# Patient Record
Sex: Female | Born: 1960 | Race: White | Hispanic: No | Smoking: Never smoker
Health system: Southern US, Community
[De-identification: ages and names within clinical notes are randomized; demographics above are authoritative.]

## PROBLEM LIST (undated history)

## (undated) DIAGNOSIS — R011 Cardiac murmur, unspecified: Secondary | ICD-10-CM

## (undated) DIAGNOSIS — E785 Hyperlipidemia, unspecified: Secondary | ICD-10-CM

## (undated) DIAGNOSIS — K754 Autoimmune hepatitis: Secondary | ICD-10-CM

## (undated) DIAGNOSIS — K7460 Unspecified cirrhosis of liver: Secondary | ICD-10-CM

## (undated) DIAGNOSIS — I7300 Raynaud's syndrome without gangrene: Secondary | ICD-10-CM

## (undated) DIAGNOSIS — Z8489 Family history of other specified conditions: Secondary | ICD-10-CM

## (undated) DIAGNOSIS — I8500 Esophageal varices without bleeding: Secondary | ICD-10-CM

## (undated) HISTORY — DX: Hyperlipidemia, unspecified: E78.5

## (undated) HISTORY — PX: ABDOMINAL HYSTERECTOMY: SHX81

## (undated) HISTORY — PX: WISDOM TOOTH EXTRACTION: SHX21

## (undated) HISTORY — DX: Autoimmune hepatitis: K75.4

## (undated) HISTORY — DX: Esophageal varices without bleeding: I85.00

## (undated) HISTORY — DX: Unspecified cirrhosis of liver: K74.60

---

## 1999-10-17 ENCOUNTER — Other Ambulatory Visit: Admission: RE | Admit: 1999-10-17 | Discharge: 1999-10-17 | Payer: Self-pay | Admitting: *Deleted

## 1999-10-17 ENCOUNTER — Encounter (INDEPENDENT_AMBULATORY_CARE_PROVIDER_SITE_OTHER): Payer: Self-pay | Admitting: Specialist

## 1999-12-08 ENCOUNTER — Other Ambulatory Visit: Admission: RE | Admit: 1999-12-08 | Discharge: 1999-12-08 | Payer: Self-pay | Admitting: *Deleted

## 2000-01-29 ENCOUNTER — Emergency Department (HOSPITAL_COMMUNITY): Admission: EM | Admit: 2000-01-29 | Discharge: 2000-01-29 | Payer: Self-pay | Admitting: *Deleted

## 2001-01-17 ENCOUNTER — Other Ambulatory Visit: Admission: RE | Admit: 2001-01-17 | Discharge: 2001-01-17 | Payer: Self-pay | Admitting: *Deleted

## 2002-01-30 ENCOUNTER — Other Ambulatory Visit: Admission: RE | Admit: 2002-01-30 | Discharge: 2002-01-30 | Payer: Self-pay | Admitting: *Deleted

## 2003-02-26 ENCOUNTER — Other Ambulatory Visit: Admission: RE | Admit: 2003-02-26 | Discharge: 2003-02-26 | Payer: Self-pay | Admitting: *Deleted

## 2004-03-20 ENCOUNTER — Other Ambulatory Visit: Admission: RE | Admit: 2004-03-20 | Discharge: 2004-03-20 | Payer: Self-pay | Admitting: *Deleted

## 2005-04-13 ENCOUNTER — Other Ambulatory Visit: Admission: RE | Admit: 2005-04-13 | Discharge: 2005-04-13 | Payer: Self-pay | Admitting: *Deleted

## 2006-05-17 ENCOUNTER — Other Ambulatory Visit: Admission: RE | Admit: 2006-05-17 | Discharge: 2006-05-17 | Payer: Self-pay | Admitting: *Deleted

## 2007-06-14 ENCOUNTER — Other Ambulatory Visit: Admission: RE | Admit: 2007-06-14 | Discharge: 2007-06-14 | Payer: Self-pay | Admitting: *Deleted

## 2007-09-06 ENCOUNTER — Other Ambulatory Visit: Admission: RE | Admit: 2007-09-06 | Discharge: 2007-09-06 | Payer: Self-pay | Admitting: Gynecology

## 2007-11-11 ENCOUNTER — Ambulatory Visit: Admission: RE | Admit: 2007-11-11 | Discharge: 2007-11-11 | Payer: Self-pay | Admitting: Gynecology

## 2008-09-14 ENCOUNTER — Encounter: Admission: RE | Admit: 2008-09-14 | Discharge: 2008-09-14 | Payer: Self-pay | Admitting: Gynecology

## 2010-04-07 ENCOUNTER — Encounter: Payer: Self-pay | Admitting: Gynecology

## 2010-07-29 NOTE — Consult Note (Signed)
NAME:  Michelle Schneider, Michelle Schneider             ACCOUNT NO.:  [id number]   MEDICAL RECORD NO.:  [id number]          PATIENT TYPE:  OUT   LOCATION:  GYN                          FACILITY:  [facility]   PHYSICIAN:  De Blanch, M.D.DATE OF BIRTH:  Apr 06, 1960   DATE OF CONSULTATION:  11/11/2007  DATE OF DISCHARGE:                                 CONSULTATION   CHIEF COMPLAINT:  Dysplasia of the cervix.   Patient is seen in consultation at the request of Dr. Teodora Medici  regarding management of Pap smear showing low grade squamous  intraepithelial lesion.  The patient's history dates back to undergoing  a supracervical hysterectomy for uterine fibroids.  Prior to that time,  she has never had an abnormal Pap smear.  Subsequently, in March, 2009,  she had a Pap smear showing LGSIL.  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  Subsequently, the patient had a repeat Pap smear on September 06, 2007, which revealed ASCUS.  Colposcopy and biopsies were performed  by Dr. Chevis Pretty.  Biopsies were negative.   The patient denies any past history of vaginal or cervical dysplasia or  condylomata.  She has no other significant gynecologic history.   OBSTETRICAL HISTORY:  Gravida 0.   PAST SURGICAL HISTORY:  Supracervical hysterectomy for uterine fibroids.   CURRENT MEDICATIONS:  1. Primrose.  2. DHEAS.  3. Magnesium.  4. Folic acid.  5. Vitamin D.   FAMILY HISTORY:  Negative for gynecologic, breast, or colon cancer.   DRUG ALLERGIES:  None.   REVIEW OF SYSTEMS:  A 10-point comprehensive review of systems is  negative, except as noted above.   SOCIAL HISTORY:  Patient is married and does not smoke.   PHYSICAL EXAMINATION:  Height 5 foot 2.  Weight 97 pounds.  Blood  pressure 128/82, pulse 72.  GENERAL:  Patient is a healthy, slender white female in no acute  distress.  HEENT:  Negative.  NECK:  Supple without thyromegaly.  There is no supraclavicular or  inguinal adenopathy.  ABDOMEN:   Soft and nontender.  No mass, organomegaly, ascites, or  hernias are noted.  PELVIC:  EG/BUS, vagina, bladder, and urethra are normal.  There is a  portion of residual cervix noted at the vaginal vault.  Bimanual exam  reveals no other masses or uterine fundus.   PROCEDURE NOTE:  Colposcopic examination is performed of the cervix and  entire vagina.  I am unable to see any lesions, despite the use of a  green filter and acetic acid.  In addition, Lugol's solution is applied,  and no lesions are noted.   IMPRESSION:  ASCUS and LGSIL Pap smears with no obvious lesions on  colposcopy.   I had a lengthy discussion with the patient regarding the natural  history of HPV infections and the fact that if this is occult, we have  no treatment that we should pursue.  I would recommend that she have a  repeat Pap smear in six months.  Unless she has progressive worsening of  her Pap smear, I would simply provide continued surveillance.  De Blanch, M.D.  Electronically Signed     DC/MEDQ  D:  11/11/2007  T:  11/11/2007  Job:  [id number]   cc:   Leatha Gilding. Mezer, M.D.  Fax: [phone]   Telford Nab, R.N.  [address]  [place], [place] [address]

## 2015-05-11 ENCOUNTER — Encounter (HOSPITAL_BASED_OUTPATIENT_CLINIC_OR_DEPARTMENT_OTHER): Payer: Self-pay | Admitting: Emergency Medicine

## 2015-05-11 ENCOUNTER — Emergency Department (HOSPITAL_BASED_OUTPATIENT_CLINIC_OR_DEPARTMENT_OTHER)
Admission: EM | Admit: 2015-05-11 | Discharge: 2015-05-11 | Disposition: A | Discharge status: Home / Self Care | Payer: Self-pay | Attending: Emergency Medicine | Admitting: Emergency Medicine

## 2015-05-11 ENCOUNTER — Emergency Department (HOSPITAL_BASED_OUTPATIENT_CLINIC_OR_DEPARTMENT_OTHER): Payer: Self-pay

## 2015-05-11 DIAGNOSIS — Y998 Other external cause status: Secondary | ICD-10-CM | POA: Insufficient documentation

## 2015-05-11 DIAGNOSIS — S0011XA Contusion of right eyelid and periocular area, initial encounter: Secondary | ICD-10-CM | POA: Insufficient documentation

## 2015-05-11 DIAGNOSIS — Y9389 Activity, other specified: Secondary | ICD-10-CM | POA: Insufficient documentation

## 2015-05-11 DIAGNOSIS — S42201A Unspecified fracture of upper end of right humerus, initial encounter for closed fracture: Secondary | ICD-10-CM | POA: Insufficient documentation

## 2015-05-11 DIAGNOSIS — W010XXA Fall on same level from slipping, tripping and stumbling without subsequent striking against object, initial encounter: Secondary | ICD-10-CM | POA: Insufficient documentation

## 2015-05-11 DIAGNOSIS — Y92002 Bathroom of unspecified non-institutional (private) residence single-family (private) house as the place of occurrence of the external cause: Secondary | ICD-10-CM | POA: Insufficient documentation

## 2015-05-11 MED ORDER — OXYCODONE-ACETAMINOPHEN 5-325 MG PO TABS
1.0000 | ORAL_TABLET | Freq: Once | ORAL | Status: AC
  Administered 2015-05-11: 1 via ORAL
  Filled 2015-05-11: qty 1

## 2015-05-11 MED ORDER — OXYCODONE-ACETAMINOPHEN 5-325 MG PO TABS: 1.0000 | ORAL_TABLET | ORAL | Status: DC | PRN

## 2015-05-11 NOTE — ED Provider Notes (Signed)
CSN: [id number]     Arrival date & time 05/11/15  1138 History   First MD Initiated Contact with Patient 05/11/15 1155     Chief Complaint  Patient presents with  . Shoulder Injury     (Consider location/radiation/quality/duration/timing/severity/associated sxs/prior Treatment) HPI Comments: Patient planes of right shoulder pain. She states that last night about 11:30 she tripped over one of her little dogs in the bathroom and fell over onto the floor. She injured her right shoulder. She also struck the corner of her right eye on the floor she thinks but she denies any facial pain. No vision changes. She denies any headache. No nausea or vomiting. No neck or back pain. She denies any other injuries from the fall. She is not on anticoagulants.  Patient is a 55 y.o. female presenting with shoulder injury.  Shoulder Injury Pertinent negatives include no headaches.    History reviewed. No pertinent past medical history. Past Surgical History  Procedure Laterality Date  . Abdominal hysterectomy     No family history on file. Social History  Substance Use Topics  . Smoking status: Never Smoker   . Smokeless tobacco: None  . Alcohol Use: Yes     Comment: occasional   OB History    No data available     Review of Systems  Constitutional: Negative for fever.  Gastrointestinal: Negative for nausea and vomiting.  Musculoskeletal: Positive for joint swelling and arthralgias. Negative for back pain and neck pain.  Skin: Negative for wound.  Neurological: Negative for weakness, numbness and headaches.      Allergies  Review of patient's allergies indicates no known allergies.  Home Medications   Prior to Admission medications   Medication Sig Start Date End Date Taking? Authorizing Provider  oxyCODONE-acetaminophen (PERCOCET) 5-325 MG tablet Take 1-2 tablets by mouth every 4 (four) hours as needed. 05/11/15   Rolan Bucco, MD   BP 147/85 mmHg  Pulse 82  Temp(Src) 98.4 F  (36.9 C) (Oral)  Resp 16  Ht  (1.575 m)  Wt 96 lb (43.545 kg)  BMI 17.55 kg/m2  SpO2 100% Physical Exam  Constitutional: She is oriented to person, place, and time. She appears well-developed and well-nourished.  HENT:  Head: Normocephalic.  There is a small area of ecchymosis and swelling to the lateral aspect of her right eye. There is no erythema or evident injury to the eye itself. Extraocular eye movements are intact. There is no underlying bony tenderness to the face.  Neck: Normal range of motion. Neck supple.  No pain along the cervical thoracic or lumbosacral spine  Cardiovascular: Normal rate.   Pulmonary/Chest: Effort normal.  Musculoskeletal: She exhibits edema and tenderness.  Positive swelling and tenderness to the right shoulder diffusely. There is no pain to the elbow or wrist. She has normal sensation and motor function distally. Radial pulses are intact. There is no other pain on palpation or range of motion of the extremities  Neurological: She is alert and oriented to person, place, and time.  Skin: Skin is warm and dry.  Psychiatric: She has a normal mood and affect.    ED Course  Procedures (including critical care time) Labs Review Labs Reviewed - No data to display  Imaging Review Dg Shoulder Right  05/11/2015  CLINICAL DATA:  Right shoulder pain and swelling after fall yesterday in bathroom. EXAM: RIGHT SHOULDER - 2+ VIEW COMPARISON:  None. FINDINGS: There is a right humeral neck fracture with mild displacement. No dislocation. No  additional acute bony abnormality. IMPRESSION: Mildly displaced right humeral neck fracture. Electronically Signed   By: Charlett Nose M.D.   On: 05/11/2015 12:31   I have personally reviewed and evaluated these images and lab results as part of my medical decision-making.   EKG Interpretation None      MDM   Final diagnoses:  Proximal humerus fracture, right, closed, initial encounter    Patient with a proximal  right humerus fracture. She's neurovascularly intact. She was placed in a shoulder sling. She was given prescription for Percocet for pain and given a dose here in the ED. There is no other evident injuries. She was advised to make a follow-up appointment with orthopedics.    Rolan Bucco, MD 05/11/15 1242

## 2015-05-11 NOTE — Discharge Instructions (Signed)
Humerus Fracture Treated With Immobilization °The humerus is the large bone in the upper arm. A broken (fractured) humerus is often treated by wearing a cast, splint, or sling (immobilization). This holds the broken pieces in place so they can heal.  °HOME CARE °· Put ice on the injured area. °¨ Put ice in a plastic bag. °¨ Place a towel between your skin and the bag. °¨ Leave the ice on for 15-20 minutes, 03-04 times a day. °· If you are given a cast: °¨ Do not scratch the skin under the cast. °¨ Check the skin around the cast every day. You may put lotion on any red or sore areas. °¨ Keep the cast dry and clean. °· If you are given a splint: °¨ Wear the splint as told. °¨ Keep the splint clean and dry. °¨ Loosen the elastic around the splint if your fingers become numb, cold, tingle, or turn blue. °· If you are given a sling: °¨ Wear the sling as told. °· Do not put pressure on any part of the cast or splint until it is fully hardened. °· The cast or splint must be protected with a plastic bag during bathing. Do not lower the cast or splint into water. °· Only take medicine as told by your doctor. °· Do exercises as told by your doctor. °· Follow up as told by your doctor. °GET HELP RIGHT AWAY IF:  °· Your skin or fingernails turn blue or gray. °· Your arm feels cold or numb. °· You have very bad pain in the injured arm. °· You are having problems with the medicines you were given. °MAKE SURE YOU:  °· Understand these instructions. °· Will watch your condition. °· Will get help right away if you are not doing well or get worse. °  °This information is not intended to replace advice given to you by your health care provider. Make sure you discuss any questions you have with your health care provider. °  °Document Released: 08/19/2007 Document Revised: 03/23/2014 Document Reviewed: 07/25/2014 °Elsevier Interactive Patient Education ©2016 [employer] ° °

## 2015-05-11 NOTE — ED Notes (Signed)
Pt tripped over dog last night, injuring right shoulder.

## 2015-05-22 ENCOUNTER — Encounter (HOSPITAL_COMMUNITY): Payer: Self-pay

## 2015-05-22 ENCOUNTER — Encounter (HOSPITAL_COMMUNITY)
Admission: RE | Admit: 2015-05-22 | Discharge: 2015-05-22 | Disposition: A | Discharge status: Home / Self Care | Payer: Self-pay | Source: Ambulatory Visit | Attending: Orthopedic Surgery | Admitting: Orthopedic Surgery

## 2015-05-22 DIAGNOSIS — W010XXA Fall on same level from slipping, tripping and stumbling without subsequent striking against object, initial encounter: Secondary | ICD-10-CM | POA: Insufficient documentation

## 2015-05-22 DIAGNOSIS — Z01818 Encounter for other preprocedural examination: Secondary | ICD-10-CM | POA: Insufficient documentation

## 2015-05-22 DIAGNOSIS — Z01812 Encounter for preprocedural laboratory examination: Secondary | ICD-10-CM | POA: Insufficient documentation

## 2015-05-22 DIAGNOSIS — Y92002 Bathroom of unspecified non-institutional (private) residence single-family (private) house as the place of occurrence of the external cause: Secondary | ICD-10-CM | POA: Insufficient documentation

## 2015-05-22 DIAGNOSIS — S42201A Unspecified fracture of upper end of right humerus, initial encounter for closed fracture: Secondary | ICD-10-CM | POA: Insufficient documentation

## 2015-05-22 HISTORY — DX: Raynaud's syndrome without gangrene: I73.00

## 2015-05-22 HISTORY — DX: Cardiac murmur, unspecified: R01.1

## 2015-05-22 HISTORY — DX: Family history of other specified conditions: Z84.89

## 2015-05-22 LAB — CBC WITH DIFFERENTIAL/PLATELET
Basophils Absolute: 0.1 10*3/uL (ref 0.0–0.1)
Basophils Relative: 2 %
Eosinophils Absolute: 0.1 10*3/uL (ref 0.0–0.7)
Eosinophils Relative: 3 %
HCT: 34.9 % — ABNORMAL LOW (ref 36.0–46.0)
Hemoglobin: 11.5 g/dL — ABNORMAL LOW (ref 12.0–15.0)
Lymphocytes Relative: 30 %
Lymphs Abs: 1.6 10*3/uL (ref 0.7–4.0)
MCH: 34.1 pg — ABNORMAL HIGH (ref 26.0–34.0)
MCHC: 33 g/dL (ref 30.0–36.0)
MCV: 103.6 fL — ABNORMAL HIGH (ref 78.0–100.0)
Monocytes Absolute: 0.7 10*3/uL (ref 0.1–1.0)
Monocytes Relative: 14 %
Neutro Abs: 2.8 10*3/uL (ref 1.7–7.7)
Neutrophils Relative %: 51 %
Platelets: 156 10*3/uL (ref 150–400)
RBC: 3.37 MIL/uL — ABNORMAL LOW (ref 3.87–5.11)
RDW: 14.5 % (ref 11.5–15.5)
WBC: 5.3 10*3/uL (ref 4.0–10.5)

## 2015-05-22 LAB — COMPREHENSIVE METABOLIC PANEL
ALT: 24 U/L (ref 14–54)
AST: 46 U/L — ABNORMAL HIGH (ref 15–41)
Albumin: 3.1 g/dL — ABNORMAL LOW (ref 3.5–5.0)
Alkaline Phosphatase: 82 U/L (ref 38–126)
Anion gap: 9 (ref 5–15)
BUN: 9 mg/dL (ref 6–20)
CO2: 23 mmol/L (ref 22–32)
Calcium: 9.3 mg/dL (ref 8.9–10.3)
Chloride: 107 mmol/L (ref 101–111)
Creatinine, Ser: 0.59 mg/dL (ref 0.44–1.00)
GFR calc Af Amer: >60 mL/min (ref >60)
GFR calc non Af Amer: >60 mL/min (ref >60)
Glucose, Bld: 101 mg/dL — ABNORMAL HIGH (ref 65–99)
Potassium: 4.1 mmol/L (ref 3.5–5.1)
Sodium: 139 mmol/L (ref 135–145)
Total Bilirubin: 2 mg/dL — ABNORMAL HIGH (ref 0.3–1.2)
Total Protein: 7.7 g/dL (ref 6.5–8.1)

## 2015-05-22 LAB — PROTIME-INR
INR: 1.34 (ref 0.00–1.49)
Prothrombin Time: 16.7 seconds — ABNORMAL HIGH (ref 11.6–15.2)

## 2015-05-22 LAB — APTT: aPTT: 37 seconds (ref 24–37)

## 2015-05-22 MED ORDER — CEFAZOLIN SODIUM-DEXTROSE 2-3 GM-% IV SOLR
2.0000 g | INTRAVENOUS | Status: AC
  Administered 2015-05-23: 2 g via INTRAVENOUS
  Filled 2015-05-22: qty 50

## 2015-05-22 MED ORDER — LACTATED RINGERS IV SOLN
INTRAVENOUS | Status: DC
  Administered 2015-05-23: 20 mL/h via INTRAVENOUS

## 2015-05-22 NOTE — Progress Notes (Signed)
PCP - denies - pt. States that if needed she goes to an urgent care but has not been since 2001 Cardiologist - denies  EKG/CXR - denies Echo/stress test/cardiac cath - denies  Patient denies chest pain and shortness of breath at PAT appointment.

## 2015-05-22 NOTE — Progress Notes (Signed)
Anesthesia Chart Review: Patient is a 55 year old female scheduled for ORIF right proximal humerus fracture on 05/23/15 by Dr. Onnie Graham. On the evening of 05/10/15 she tripped over one of her dogs and fell in the bathroom fracturing her right humerus.  History includes non-smoker, murmur '01 (was not heard in follow-up visits), Raynauds' disease, hysterectomy. Reported a cousin had a difficult time waking up after anesthesia. No PCP--seen in Urgent Care as needed. Denied prior cardiac testing. Denied CP/SOB.   Preoperative labs noted. Glucose 101. Cr 0.59. Total bilirubin elevated at 2.0, but Alk Ph and ALT WNL. AST minimally elevated at 46. H/H 11.5/34.9. PT 16.7, but INR normal at 1.34. PTT 37. No comparison labs noted.   Further evaluation on the day of surgery by her anesthesiologist to determine the definitive anesthesia plan.   [facility]/Anesthesiology Phone [phone] 05/22/2015 1:22 PM

## 2015-05-23 ENCOUNTER — Encounter (HOSPITAL_COMMUNITY)
Admission: RE | Disposition: A | Discharge status: Home / Self Care | Payer: Self-pay | Source: Ambulatory Visit | Attending: Orthopedic Surgery

## 2015-05-23 ENCOUNTER — Ambulatory Visit (HOSPITAL_COMMUNITY)
Admission: RE | Admit: 2015-05-23 | Discharge: 2015-05-23 | Disposition: A | Discharge status: Home / Self Care | Payer: Self-pay | Source: Ambulatory Visit | Attending: Orthopedic Surgery | Admitting: Orthopedic Surgery

## 2015-05-23 ENCOUNTER — Encounter (HOSPITAL_COMMUNITY): Payer: Self-pay | Admitting: *Deleted

## 2015-05-23 ENCOUNTER — Ambulatory Visit (HOSPITAL_COMMUNITY): Payer: MEDICAID

## 2015-05-23 ENCOUNTER — Ambulatory Visit (HOSPITAL_COMMUNITY): Payer: MEDICAID | Admitting: Vascular Surgery

## 2015-05-23 ENCOUNTER — Ambulatory Visit (HOSPITAL_COMMUNITY): Payer: Self-pay | Admitting: Anesthesiology

## 2015-05-23 DIAGNOSIS — I739 Peripheral vascular disease, unspecified: Secondary | ICD-10-CM | POA: Insufficient documentation

## 2015-05-23 DIAGNOSIS — Z419 Encounter for procedure for purposes other than remedying health state, unspecified: Secondary | ICD-10-CM

## 2015-05-23 DIAGNOSIS — W19XXXA Unspecified fall, initial encounter: Secondary | ICD-10-CM | POA: Insufficient documentation

## 2015-05-23 DIAGNOSIS — S42221A 2-part displaced fracture of surgical neck of right humerus, initial encounter for closed fracture: Secondary | ICD-10-CM | POA: Insufficient documentation

## 2015-05-23 HISTORY — PX: ORIF HUMERUS FRACTURE: SHX2126

## 2015-05-23 SURGERY — OPEN REDUCTION INTERNAL FIXATION (ORIF) PROXIMAL HUMERUS FRACTURE
Anesthesia: Regional | Site: Arm Upper | Laterality: Right

## 2015-05-23 MED ORDER — DIAZEPAM 5 MG PO TABS: 2.5000 mg | ORAL_TABLET | Freq: Four times a day (QID) | ORAL | Status: DC | PRN

## 2015-05-23 MED ORDER — DEXAMETHASONE SODIUM PHOSPHATE 4 MG/ML IJ SOLN
INTRAMUSCULAR | Status: AC
  Filled 2015-05-23: qty 1

## 2015-05-23 MED ORDER — ONDANSETRON HCL 4 MG/2ML IJ SOLN
INTRAMUSCULAR | Status: AC
  Filled 2015-05-23: qty 2

## 2015-05-23 MED ORDER — SUGAMMADEX SODIUM 200 MG/2ML IV SOLN
INTRAVENOUS | Status: AC
  Filled 2015-05-23: qty 2

## 2015-05-23 MED ORDER — FENTANYL CITRATE (PF) 100 MCG/2ML IJ SOLN
INTRAMUSCULAR | Status: AC
  Administered 2015-05-23: 50 ug
  Filled 2015-05-23: qty 2

## 2015-05-23 MED ORDER — PROPOFOL 10 MG/ML IV BOLUS
INTRAVENOUS | Status: DC | PRN
  Administered 2015-05-23: 140 mg via INTRAVENOUS

## 2015-05-23 MED ORDER — ONDANSETRON HCL 4 MG PO TABS: 4.0000 mg | ORAL_TABLET | Freq: Three times a day (TID) | ORAL | Status: DC | PRN

## 2015-05-23 MED ORDER — PROPOFOL 10 MG/ML IV BOLUS
INTRAVENOUS | Status: AC
  Filled 2015-05-23: qty 20

## 2015-05-23 MED ORDER — LIDOCAINE HCL 4 % EX SOLN
CUTANEOUS | Status: DC | PRN
  Administered 2015-05-23: 2 mL via TOPICAL

## 2015-05-23 MED ORDER — BUPIVACAINE-EPINEPHRINE (PF) 0.5% -1:200000 IJ SOLN
INTRAMUSCULAR | Status: DC | PRN
  Administered 2015-05-23: 30 mL via PERINEURAL

## 2015-05-23 MED ORDER — MIDAZOLAM HCL 5 MG/5ML IJ SOLN
INTRAMUSCULAR | Status: DC | PRN
  Administered 2015-05-23: 1 mg via INTRAVENOUS

## 2015-05-23 MED ORDER — SODIUM CHLORIDE 0.9 % IJ SOLN
INTRAMUSCULAR | Status: AC
  Filled 2015-05-23: qty 10

## 2015-05-23 MED ORDER — SUCCINYLCHOLINE CHLORIDE 20 MG/ML IJ SOLN
INTRAMUSCULAR | Status: AC
  Filled 2015-05-23: qty 1

## 2015-05-23 MED ORDER — ROCURONIUM BROMIDE 100 MG/10ML IV SOLN
INTRAVENOUS | Status: DC | PRN
  Administered 2015-05-23: 35 mg via INTRAVENOUS

## 2015-05-23 MED ORDER — LACTATED RINGERS IV SOLN: INTRAVENOUS | Status: DC

## 2015-05-23 MED ORDER — GLYCOPYRROLATE 0.2 MG/ML IJ SOLN
INTRAMUSCULAR | Status: DC | PRN
  Administered 2015-05-23: 0.3 mg via INTRAVENOUS

## 2015-05-23 MED ORDER — PHENYLEPHRINE HCL 10 MG/ML IJ SOLN
10.0000 mg | INTRAMUSCULAR | Status: DC | PRN
  Administered 2015-05-23: 25 ug/min via INTRAVENOUS

## 2015-05-23 MED ORDER — EPHEDRINE SULFATE 50 MG/ML IJ SOLN
INTRAMUSCULAR | Status: DC | PRN
  Administered 2015-05-23 (×2): 5 mg via INTRAVENOUS

## 2015-05-23 MED ORDER — CHLORHEXIDINE GLUCONATE 4 % EX LIQD: 60.0000 mL | Freq: Once | CUTANEOUS | Status: DC

## 2015-05-23 MED ORDER — GLYCOPYRROLATE 0.2 MG/ML IJ SOLN
INTRAMUSCULAR | Status: AC
  Filled 2015-05-23: qty 2

## 2015-05-23 MED ORDER — ONDANSETRON HCL 4 MG/2ML IJ SOLN
INTRAMUSCULAR | Status: DC | PRN
  Administered 2015-05-23: 4 mg via INTRAVENOUS

## 2015-05-23 MED ORDER — NEOSTIGMINE METHYLSULFATE 10 MG/10ML IV SOLN
INTRAVENOUS | Status: DC | PRN
  Administered 2015-05-23: 2 mg via INTRAVENOUS

## 2015-05-23 MED ORDER — ARTIFICIAL TEARS OP OINT
TOPICAL_OINTMENT | OPHTHALMIC | Status: AC
  Filled 2015-05-23: qty 3.5

## 2015-05-23 MED ORDER — MIDAZOLAM HCL 2 MG/2ML IJ SOLN
INTRAMUSCULAR | Status: AC
  Administered 2015-05-23: 1 mg
  Filled 2015-05-23: qty 2

## 2015-05-23 MED ORDER — ROCURONIUM BROMIDE 50 MG/5ML IV SOLN
INTRAVENOUS | Status: AC
  Filled 2015-05-23: qty 1

## 2015-05-23 MED ORDER — HYDROMORPHONE HCL 1 MG/ML IJ SOLN: 0.2500 mg | INTRAMUSCULAR | Status: DC | PRN

## 2015-05-23 MED ORDER — DEXAMETHASONE SODIUM PHOSPHATE 4 MG/ML IJ SOLN
INTRAMUSCULAR | Status: DC | PRN
  Administered 2015-05-23: 4 mg via INTRAVENOUS

## 2015-05-23 MED ORDER — FENTANYL CITRATE (PF) 100 MCG/2ML IJ SOLN
INTRAMUSCULAR | Status: DC | PRN
  Administered 2015-05-23: 50 ug via INTRAVENOUS

## 2015-05-23 MED ORDER — FENTANYL CITRATE (PF) 250 MCG/5ML IJ SOLN
INTRAMUSCULAR | Status: AC
  Filled 2015-05-23: qty 5

## 2015-05-23 MED ORDER — MIDAZOLAM HCL 2 MG/2ML IJ SOLN
INTRAMUSCULAR | Status: AC
  Filled 2015-05-23: qty 2

## 2015-05-23 MED ORDER — LACTATED RINGERS IV SOLN
INTRAVENOUS | Status: DC | PRN
  Administered 2015-05-23 (×2): via INTRAVENOUS

## 2015-05-23 MED ORDER — ARTIFICIAL TEARS OP OINT
TOPICAL_OINTMENT | OPHTHALMIC | Status: DC | PRN
  Administered 2015-05-23: 1 via OPHTHALMIC

## 2015-05-23 MED ORDER — OXYCODONE-ACETAMINOPHEN 5-325 MG PO TABS: 1.0000 | ORAL_TABLET | ORAL | Status: DC | PRN

## 2015-05-23 MED ORDER — 0.9 % SODIUM CHLORIDE (POUR BTL) OPTIME
TOPICAL | Status: DC | PRN
  Administered 2015-05-23: 1000 mL

## 2015-05-23 MED ORDER — NEOSTIGMINE METHYLSULFATE 10 MG/10ML IV SOLN
INTRAVENOUS | Status: AC
  Filled 2015-05-23: qty 1

## 2015-05-23 MED ORDER — ONDANSETRON HCL 4 MG/2ML IJ SOLN: 4.0000 mg | Freq: Four times a day (QID) | INTRAMUSCULAR | Status: DC | PRN

## 2015-05-23 MED ORDER — EPHEDRINE SULFATE 50 MG/ML IJ SOLN
INTRAMUSCULAR | Status: AC
  Filled 2015-05-23: qty 1

## 2015-05-23 MED ORDER — LIDOCAINE HCL (CARDIAC) 20 MG/ML IV SOLN
INTRAVENOUS | Status: DC | PRN
  Administered 2015-05-23: 20 mg via INTRAVENOUS

## 2015-05-23 SURGICAL SUPPLY — 74 items
ADH SKN CLS APL DERMABOND .7 (GAUZE/BANDAGES/DRESSINGS) ×1
AID PSTN UNV HD RSTRNT DISP (MISCELLANEOUS) ×1
BIT DRILL 3.2 (BIT) ×2
BIT DRILL 3.2XCALB NS DISP (BIT) IMPLANT
BIT DRILL CALIBRATED 2.7 (BIT) ×1 IMPLANT
BIT DRL 3.2XCALB NS DISP (BIT) ×1
BONE CANC CHIPS 20CC PCAN1/4 (Bone Implant) ×2 IMPLANT
CHIPS CANC BONE 20CC PCAN1/4 (Bone Implant) ×1 IMPLANT
COVER SURGICAL LIGHT HANDLE (MISCELLANEOUS) ×2 IMPLANT
DERMABOND ADVANCED (GAUZE/BANDAGES/DRESSINGS) ×1
DERMABOND ADVANCED .7 DNX12 (GAUZE/BANDAGES/DRESSINGS) IMPLANT
DRAPE C-ARM 42X72 X-RAY (DRAPES) ×2 IMPLANT
DRAPE IMP U-DRAPE 54X76 (DRAPES) ×2 IMPLANT
DRAPE INCISE IOBAN 66X45 STRL (DRAPES) ×3 IMPLANT
DRAPE ORTHO SPLIT 77X108 STRL (DRAPES) ×4
DRAPE SURG ORHT 6 SPLT 77X108 (DRAPES) ×2 IMPLANT
DRAPE U-SHAPE 47X51 STRL (DRAPES) ×2 IMPLANT
DRSG AQUACEL AG ADV 3.5X10 (GAUZE/BANDAGES/DRESSINGS) ×2 IMPLANT
DRSG MEPILEX BORDER 4X8 (GAUZE/BANDAGES/DRESSINGS) ×1 IMPLANT
DURAPREP 26ML APPLICATOR (WOUND CARE) ×2 IMPLANT
ELECT REM PT RETURN 9FT ADLT (ELECTROSURGICAL) ×2
ELECTRODE REM PT RTRN 9FT ADLT (ELECTROSURGICAL) ×1 IMPLANT
GLOVE BIO SURGEON STRL SZ7.5 (GLOVE) ×2 IMPLANT
GLOVE BIO SURGEON STRL SZ8 (GLOVE) ×2 IMPLANT
GLOVE EUDERMIC 7 POWDERFREE (GLOVE) ×4 IMPLANT
GLOVE SS BIOGEL STRL SZ 7.5 (GLOVE) ×2 IMPLANT
GLOVE SUPERSENSE BIOGEL SZ 7.5 (GLOVE) ×2
GOWN STRL REUS W/ TWL LRG LVL3 (GOWN DISPOSABLE) ×1 IMPLANT
GOWN STRL REUS W/ TWL XL LVL3 (GOWN DISPOSABLE) ×2 IMPLANT
GOWN STRL REUS W/TWL LRG LVL3 (GOWN DISPOSABLE) ×2
GOWN STRL REUS W/TWL XL LVL3 (GOWN DISPOSABLE) ×4
GRAFT BNE CANC CHIPS 1-8 20CC (Bone Implant) IMPLANT
K-WIRE 2X5 SS THRDED S3 (WIRE) ×8
KIT BASIN OR (CUSTOM PROCEDURE TRAY) ×2 IMPLANT
KIT ROOM TURNOVER OR (KITS) ×4 IMPLANT
KWIRE 2X5 SS THRDED S3 (WIRE) IMPLANT
MANIFOLD NEPTUNE II (INSTRUMENTS) ×2 IMPLANT
NDL SUT .5 MAYO 1.404X.05X (NEEDLE) IMPLANT
NEEDLE 22X1 1/2 (OR ONLY) (NEEDLE) ×1 IMPLANT
NEEDLE MAYO TAPER (NEEDLE)
NS IRRIG 1000ML POUR BTL (IV SOLUTION) ×2 IMPLANT
PACK SHOULDER (CUSTOM PROCEDURE TRAY) ×2 IMPLANT
PAD ARMBOARD 7.5X6 YLW CONV (MISCELLANEOUS) ×4 IMPLANT
PEG LOCKING 3.2MMX44 (Peg) ×1 IMPLANT
PEG LOCKING 3.2X32 (Peg) ×2 IMPLANT
PEG LOCKING 3.2X34 (Screw) ×2 IMPLANT
PEG LOCKING 3.2X36 (Screw) ×1 IMPLANT
PEG LOCKING 3.2X38 (Screw) ×1 IMPLANT
PEG LOCKING 3.2X40 (Peg) ×1 IMPLANT
PEG LOCKING 3.2X48 (Peg) ×1 IMPLANT
PLATE PROX HUM HI R 3H 80 (Plate) ×1 IMPLANT
PUTTY DBM STAGRAFT PLUS 5CC (Putty) ×1 IMPLANT
RESTRAINT HEAD UNIVERSAL NS (MISCELLANEOUS) ×2 IMPLANT
SCREW LP NL T15 3.5X22 (Screw) ×2 IMPLANT
SCREW LP NL T15 3.5X24 (Screw) ×1 IMPLANT
SCREW PEG LOCK 3.2X30MM (Screw) ×2 IMPLANT
SLEEVE MEASURING 3.2 (BIT) ×1 IMPLANT
SLING ARM FOAM STRAP MED (SOFTGOODS) ×1 IMPLANT
SLING ULTRA II LARGE (SOFTGOODS) ×1 IMPLANT
SPONGE LAP 18X18 X RAY DECT (DISPOSABLE) ×2 IMPLANT
SPONGE LAP 4X18 X RAY DECT (DISPOSABLE) ×1 IMPLANT
SUCTION FRAZIER HANDLE 10FR (MISCELLANEOUS) ×1
SUCTION TUBE FRAZIER 10FR DISP (MISCELLANEOUS) ×1 IMPLANT
SUT FIBERWIRE #2 38 T-5 BLUE (SUTURE) ×4
SUT MNCRL AB 3-0 PS2 18 (SUTURE) ×2 IMPLANT
SUT VIC AB 1 CT1 27 (SUTURE) ×2
SUT VIC AB 1 CT1 27XBRD ANTBC (SUTURE) ×1 IMPLANT
SUT VIC AB 2-0 CT1 27 (SUTURE) ×4
SUT VIC AB 2-0 CT1 TAPERPNT 27 (SUTURE) ×2 IMPLANT
SUTURE FIBERWR #2 38 T-5 BLUE (SUTURE) IMPLANT
SYR CONTROL 10ML LL (SYRINGE) ×2 IMPLANT
TOWEL OR 17X24 6PK STRL BLUE (TOWEL DISPOSABLE) ×2 IMPLANT
TOWEL OR 17X26 10 PK STRL BLUE (TOWEL DISPOSABLE) ×2 IMPLANT
WATER STERILE IRR 1000ML POUR (IV SOLUTION) ×1 IMPLANT

## 2015-05-23 NOTE — Anesthesia Procedure Notes (Addendum)
Anesthesia Regional Block:  Interscalene brachial plexus block  Pre-Anesthetic Checklist: ,, timeout performed, Correct Patient, Correct Site, Correct Laterality, Correct Procedure, Correct Position, site marked, Risks and benefits discussed,  Surgical consent,  Pre-op evaluation,  At surgeon's request and post-op pain management  Laterality: Right  Prep: chloraprep       Needles:  Injection technique: Single-shot  Needle Type: Echogenic Stimulator Needle     Needle Length: 5cm 5 cm Needle Gauge: 22 and 22 G    Additional Needles:  Procedures: ultrasound guided (picture in chart) and nerve stimulator Interscalene brachial plexus block  Nerve Stimulator or Paresthesia:  Response: biceps flexion, 0.45 mA,   Additional Responses:   Narrative:  Start time: 05/23/2015 9:56 AM End time: 05/23/2015 10:06 AM Injection made incrementally with aspirations every 5 mL.  Performed by: Personally  Anesthesiologist: HODIERNE, ADAM  Additional Notes: Functioning IV was confirmed and monitors were applied.  A 79m 22ga Arrow echogenic stimulator needle was used. Sterile prep and drape,hand hygiene and sterile gloves were used.  Negative aspiration and negative test dose prior to incremental administration of local anesthetic. The patient tolerated the procedure well.  Ultrasound guidance: relevent anatomy identified, needle position confirmed, local anesthetic spread visualized around nerve(s), vascular puncture avoided.  Image printed for medical record.    Procedure Name: Intubation Date/Time: 05/23/2015 10:36 AM Performed by: ASuzy BouchardPre-anesthesia Checklist: Patient identified, Emergency Drugs available, Suction available, Timeout performed and Patient being monitored Patient Re-evaluated:Patient Re-evaluated prior to inductionOxygen Delivery Method: Circle system utilized Preoxygenation: Pre-oxygenation with 100% oxygen Intubation Type: IV induction Ventilation: Mask  ventilation without difficulty Laryngoscope Size: Miller and 2 Grade View: Grade I Tube type: Oral Laser Tube: Cuffed inflated with minimal occlusive pressure - saline Airway Equipment and Method: Stylet and LTA kit utilized Placement Confirmation: ETT inserted through vocal cords under direct vision,  positive ETCO2 and breath sounds checked- equal and bilateral Secured at: 20 cm Tube secured with: Tape Dental Injury: Teeth and Oropharynx as per pre-operative assessment

## 2015-05-23 NOTE — Discharge Instructions (Signed)
° °  Michelle ReaKevin M. Supple, M.D., F.A.A.O.S. Orthopaedic Surgery Specializing in Arthroscopic and Reconstructive Surgery of the Shoulder and Knee [phone] [address] [place], [place] [id number] - Fax [phone]   POST-OP  SHOULDER  INSTRUCTIONS  1. Call the office at [phone] to schedule your first post-op appointment 10-14 days from the date of your surgery.  2. The bandage over your incision is waterproof. You may begin showering with this dressing on. You may leave this dressing on until first follow up appointment within 2 weeks. We prefer you leave this dressing in place until follow up however after 5-7 days if you are having itching or skin irritation and would like to remove it you may do so. Go slow and tug at the borders gently to break the bond the dressing has with the skin. At this point if there is no drainage it is okay to go without a bandage or you may cover it with a light guaze and tape. You can also expect significant bruising around your shoulder that will drift down your arm and into your chest wall. This is very normal and should resolve over several days.   3. Wear your sling/immobilizer at all times except to  occasionally let your arm dangle by your side to stretch your elbow. You also need to sleep in your sling immobilizer until instructed otherwise.  4. Range of motion to your elbow, wrist, and hand are encouraged 3-5 times daily. Exercise to your hand and fingers helps to reduce swelling you may experience.  5. Utilize ice to the shoulder 3-5 times minimum a day and additionally if you are experiencing pain.  6. Prescriptions for a pain medication and a muscle relaxant are provided for you. It is recommended that if you are experiencing pain that you pain medication alone is not controlling, add the muscle relaxant along with the pain medication which can give additional pain relief. The first 1-2 days is generally the most severe of your pain and  then should gradually decrease. As your pain lessens it is recommended that you decrease your use of the pain medications to an "as needed basis'" only and to always comply with the recommended dosages of the pain medications.  7. Pain medications can produce constipation along with their use. If you experience this, the use of an over the counter stool softener or laxative daily is recommended.   8. For most patients, if insurance allows, home health services to include therapy has been arranged.  9. For additional questions or concerns, please do not hesitate to call the office. If after hours there is an answering service to forward your concerns to the physician on call.  POST-OP EXERCISES  Pendulum Exercises  OK to allow arm to dangle by side and move elbow wrist and hand

## 2015-05-23 NOTE — Transfer of Care (Signed)
Immediate Anesthesia Transfer of Care Note  Patient: Michelle Schneider  Procedure(s) Performed: Procedure(s): OPEN REDUCTION INTERNAL FIXATION (ORIF) RIGHT  PROXIMAL HUMERUS FRACTURE (Right)  Patient Location: PACU  Anesthesia Type:General and Regional  Level of Consciousness: awake, alert , oriented, patient cooperative and responds to stimulation  Airway & Oxygen Therapy: Patient Spontanous Breathing and Patient connected to nasal cannula oxygen  Post-op Assessment: Report given to RN and Post -op Vital signs reviewed and stable  Post vital signs: Reviewed and stable  Last Vitals:  Filed Vitals:   05/23/15 0836  BP: 158/88  Pulse: 72  Temp: 36.5 C    Complications: No apparent anesthesia complications

## 2015-05-23 NOTE — Op Note (Signed)
Michelle Schneider, Michelle Schneider             ACCOUNT NO.:  [id number]  MEDICAL RECORD NO.:  [id number]  LOCATION:  [facility]                         FACILITY:  [facility]  PHYSICIAN:  Vania Rea. Supple, M.D.  DATE OF BIRTH:  1961-02-11  DATE OF PROCEDURE:  05/23/2015 DATE OF DISCHARGE:  05/23/2015                              OPERATIVE REPORT   PREOPERATIVE DIAGNOSIS:  Severely displaced right 2-part proximal humerus fracture.  POSTOPERATIVE DIAGNOSIS:  Severely displaced right 2-part proximal humerus fracture.  PROCEDURE:  Open reduction and internal fixation of right 2-part proximal humerus fracture with bone grafting of the metaphyseal defect, utilizing a corticocancellous chips as well as demineralized bone matrix.  SURGEON:  Vania Rea. Supple, M.D.  Threasa HeadsFrench Ana A. Shuford, P.A.-C.  ANESTHESIA:  General endotracheal as well as interscalene block.  ESTIMATED BLOOD LOSS:  150 mL.  DRAINS:  None.  HISTORY:  Michelle Schneider is a 55 year old female, who approximately 2 weeks ago fell sustaining an impacted severely angulated and displaced right 2-part proximal humerus fracture.  Due to the degree of displacement and potential for malunion, nonunion, she is brought to the operating room at this time for planned open reduction and internal fixation with bone grafting.  We will get preoperatively counseled Michelle Schneider regarding treatment options, the potential risks versus benefits thereof.  Possible surgical complications were all reviewed including bleeding, infection, neurovascular injury, malunion, nonunion, loss of fixation, possible need for additional surgery, and potential for anesthetic complication. She understands and accepts and agrees with our planned procedure.  PROCEDURE IN DETAIL:  After undergoing routine preop evaluation, the patient received prophylactic antibiotics.  An interscalene block was established in the holding area by the Anesthesia Department.  Placed supine  on the operating table.  Underwent smooth induction of a general endotracheal anesthesia.  Placed in a beach-chair position and appropriately padded and protected.  The right shoulder girdle region was sterilely prepped and draped in a standard fashion.  Time-out was called.  An anterior deltopectoral approach.  The right shoulder was made through a 6 cm incision.  Skin flaps were elevated.  Electrocautery was used for hemostasis.  The deltopectoral interval was then developed bluntly from the coracoid to the pectoralis major tendon.  Cephalic vein taken laterally with the deltoid.  The adhesions beneath the deltoid were released and the conjoined tendon was mobilized and retracted medially.  We did divide also the upper centimeter of the pectoralis major and also elevated the deltoid insertion distally to allow room for our plate.  At this point, I identified the biceps tendon to help with orientation of our repair and found that since it had been so long since the fracture, considerable callus formation had developed.  We carefully divided these early attempts at healing were ultimately able to mobilize the fracture site and using fluoroscopic imaging.  I reduced the humeral head over the humeral shaft.  It did take considerable effort and a number of attempts to appropriately localize the humeral head over the humeral shaft.  We selected the 3-hole "high" did Biomet plate this was provisionally transfixed with a bone holding clamp at the level of the humeral metaphysis and after a number of reduction maneuvers,  ultimately we were able to achieve appropriate alignment at the fracture site and good positioning of the plate.  It was transfixed initially with a screw through the sliding hole into the shaft and then a central guide pin up into the femoral head.  Once we were satisfied with the overall position, we went ahead and filled the pegs into the humeral head.  Then completed fixation  to the shaft and then ultimately the overall construct position alignment was much to our satisfaction.  There was a significant defect in the metaphyseal region, which we filled with a combination of corticocancellous bone chips and demineralized bone matrix.  We closed the 5 mL was utilized.  Final fluoroscopic imaging showed good position of the hardware.  Good alignment in the fracture site.  Overall construct was much to our satisfaction.  The wound was then copiously irrigated.  Hemostasis was obtained.  The deltopectoral interval was then reapproximated with interrupted figure-of-eight 0 Vicryl sutures. 2-0 Vicryl used for the subcu layer, intracuticular 3-0 Monocryl for the skin followed by Dermabond.  A dry dressing.  Right arm was placed in a sling and the patient was awakened, extubated, and recovering in stable condition.  Ralene Batheracy Shuford, PA-C was used as an Geophysicist/field seismologistassistant throughout this case and essential for help with positioning of the patient, positioning of the extremity, manipulation of the fracture site, maintenance of reduction, wound closure, and intraoperative decision making.     Vania ReaKevin M. Supple, M.D.     KMS/MEDQ  D:  05/23/2015  T:  05/23/2015  Job:  [id number]

## 2015-05-23 NOTE — Op Note (Signed)
05/23/2015  1:48 PM  PATIENT:   Michelle FlightPaula D Landa  55 y.o. female  PRE-OPERATIVE DIAGNOSIS: SEVERELY DISPLACED  RIGHT 2 PART PROXIMAL HUMERUS FRACTURE  POST-OPERATIVE DIAGNOSIS:  same  PROCEDURE:  ORIF and bone grafting right 2 part proximal humerus fracture  SURGEON:  Supple, Vania ReaKevin M. M.D.  ASSISTANTS: Shuford, PAC   ANESTHESIA:   GET + ISB  EBL: 250  SPECIMEN:  none  Drains: none   PATIENT DISPOSITION:  PACU - hemodynamically stable.    PLAN OF CARE: Discharge to home after PACU  Dictation# [id number]   Contact # [phone]

## 2015-05-23 NOTE — Anesthesia Postprocedure Evaluation (Signed)
Anesthesia Post Note  Patient: Launa Flightaula D Ashton  Procedure(s) Performed: Procedure(s) (LRB): OPEN REDUCTION INTERNAL FIXATION (ORIF) RIGHT  PROXIMAL HUMERUS FRACTURE (Right)  Patient location during evaluation: PACU Anesthesia Type: General Level of consciousness: awake and alert and oriented Pain management: pain level controlled Vital Signs Assessment: post-procedure vital signs reviewed and stable Respiratory status: spontaneous breathing, nonlabored ventilation, respiratory function stable and patient connected to nasal cannula oxygen Cardiovascular status: blood pressure returned to baseline and stable Postop Assessment: no signs of nausea or vomiting Anesthetic complications: no    Last Vitals:  Filed Vitals:   05/23/15 1500 05/23/15 1515  BP:  118/78  Pulse: 72   Temp: 36.2 C   Resp: 15     Last Pain:  Filed Vitals:   05/23/15 1526  PainSc: 0-No pain                 FOSTER,MICHAEL A.

## 2015-05-23 NOTE — Transfer of Care (Signed)
Immediate Anesthesia Transfer of Care Note  Patient: Michelle Schneider  Procedure(s) Performed: Procedure(s): OPEN REDUCTION INTERNAL FIXATION (ORIF) RIGHT  PROXIMAL HUMERUS FRACTURE (Right)  Patient Location: PACU  Anesthesia Type:GA combined with regional for post-op pain  Level of Consciousness: awake and alert   Airway & Oxygen Therapy: Patient Spontanous Breathing and Patient connected to nasal cannula oxygen  Post-op Assessment: Report given to RN and Post -op Vital signs reviewed and stable  Post vital signs: Reviewed and stable  Last Vitals:  Filed Vitals:   05/23/15 0836  BP: 158/88  Pulse: 72  Temp: 36.5 C    Complications: No apparent anesthesia complications.  Left arm red (redness tracking up vein above IV).  Tracey PA notified.  Will follow.

## 2015-05-23 NOTE — Anesthesia Preprocedure Evaluation (Addendum)
Anesthesia Evaluation  Patient identified by MRN, date of birth, ID band Patient awake    Reviewed: Allergy & Precautions, H&P , NPO status , Patient's Chart, lab work & pertinent test results  Airway Mallampati: II   Neck ROM: full    Dental  (+) Teeth Intact, Dental Advisory Given   Pulmonary neg pulmonary ROS,    breath sounds clear to auscultation       Cardiovascular + Peripheral Vascular Disease   Rhythm:regular Rate:Normal  Raynaud's dz   Neuro/Psych    GI/Hepatic negative GI ROS,   Endo/Other    Renal/GU      Musculoskeletal   Abdominal   Peds  Hematology   Anesthesia Other Findings   Reproductive/Obstetrics                            Anesthesia Physical Anesthesia Plan  ASA: II  Anesthesia Plan: General and Regional   Post-op Pain Management:    Induction: Intravenous  Airway Management Planned: Oral ETT  Additional Equipment:   Intra-op Plan:   Post-operative Plan: Extubation in OR  Informed Consent: I have reviewed the patients History and Physical, chart, labs and discussed the procedure including the risks, benefits and alternatives for the proposed anesthesia with the patient or authorized representative who has indicated his/her understanding and acceptance.     Plan Discussed with: CRNA, Anesthesiologist and Surgeon  Anesthesia Plan Comments:         Anesthesia Quick Evaluation

## 2015-05-23 NOTE — H&P (Signed)
Michelle FlightPaula D Tirado    Chief Complaint: RIGHT 2 PART PROXIMAL HUMERUS FRACTURE HPI: The patient is a 55 y.o. female s/p fall sustaining displaced right 2 part proximal humerus fracture  Past Medical History  Diagnosis Date  . Family history of adverse reaction to anesthesia     cousin had a difficult time waking up after appendectomy  . Heart murmur     "diagnosed with a very slight heart murmur in 2001 but no other doctor has been able to hear it"  . Raynaud's disease     Past Surgical History  Procedure Laterality Date  . Abdominal hysterectomy      History reviewed. No pertinent family history.  Social History:  reports that she has never smoked. She does not have any smokeless tobacco history on file. She reports that she drinks alcohol. Her drug history is not on file.   Medications Prior to Admission  Medication Sig Dispense Refill  . Multiple Vitamin (MULTIVITAMIN) tablet Take 1 tablet by mouth daily. Duterra life pack    . NON FORMULARY Apply 1 application topically 2 (two) times daily as needed (for pain). Frankincense, Helichrysum, and Cypress with fracinated coconut oil (duterra blend)    . oxyCODONE-acetaminophen (PERCOCET) 5-325 MG tablet Take 1-2 tablets by mouth every 4 (four) hours as needed. (Patient taking differently: Take 0.5-1 tablets by mouth every 4 (four) hours as needed for moderate pain. ) 30 tablet 0     Physical Exam: right shoulder diffusely swollen and tender, N/V intact  Vitals  Temp:  [97.7 F (36.5 C)] 97.7 F (36.5 C) (03/09 0836) Pulse Rate:  [72] 72 (03/09 0836) BP: (158)/(88) 158/88 mmHg (03/09 0836) SpO2:  [100 %] 100 % (03/09 0836) Weight:  [44.453 kg (98 lb)] 44.453 kg (98 lb) (03/09 0836)  Assessment/Plan  Impression: Displaced RIGHT 2 PART PROXIMAL HUMERUS FRACTURE  Plan of Action: Procedure(s): OPEN REDUCTION INTERNAL FIXATION (ORIF) RIGHT  PROXIMAL HUMERUS FRACTURE  KEVIN M SUPPLE 05/23/2015, 9:55 AM Contact #  [phone]

## 2015-05-24 ENCOUNTER — Encounter (HOSPITAL_COMMUNITY): Payer: Self-pay | Admitting: Orthopedic Surgery

## 2015-08-23 ENCOUNTER — Emergency Department (HOSPITAL_BASED_OUTPATIENT_CLINIC_OR_DEPARTMENT_OTHER): Payer: Self-pay

## 2015-08-23 ENCOUNTER — Inpatient Hospital Stay (HOSPITAL_BASED_OUTPATIENT_CLINIC_OR_DEPARTMENT_OTHER)
Admission: EM | Admit: 2015-08-23 | Discharge: 2015-08-26 | DRG: 432 | Disposition: A | Discharge status: Home / Self Care | Payer: Self-pay | Attending: Internal Medicine | Admitting: Internal Medicine

## 2015-08-23 ENCOUNTER — Encounter (HOSPITAL_BASED_OUTPATIENT_CLINIC_OR_DEPARTMENT_OTHER): Payer: Self-pay | Admitting: Emergency Medicine

## 2015-08-23 DIAGNOSIS — F1010 Alcohol abuse, uncomplicated: Secondary | ICD-10-CM | POA: Diagnosis present

## 2015-08-23 DIAGNOSIS — D638 Anemia in other chronic diseases classified elsewhere: Secondary | ICD-10-CM | POA: Diagnosis present

## 2015-08-23 DIAGNOSIS — R188 Other ascites: Secondary | ICD-10-CM | POA: Diagnosis present

## 2015-08-23 DIAGNOSIS — K8020 Calculus of gallbladder without cholecystitis without obstruction: Secondary | ICD-10-CM | POA: Diagnosis present

## 2015-08-23 DIAGNOSIS — E876 Hypokalemia: Secondary | ICD-10-CM | POA: Diagnosis present

## 2015-08-23 DIAGNOSIS — D649 Anemia, unspecified: Secondary | ICD-10-CM | POA: Diagnosis present

## 2015-08-23 DIAGNOSIS — K7031 Alcoholic cirrhosis of liver with ascites: Principal | ICD-10-CM | POA: Diagnosis present

## 2015-08-23 DIAGNOSIS — R945 Abnormal results of liver function studies: Secondary | ICD-10-CM

## 2015-08-23 DIAGNOSIS — K7460 Unspecified cirrhosis of liver: Secondary | ICD-10-CM

## 2015-08-23 DIAGNOSIS — R791 Abnormal coagulation profile: Secondary | ICD-10-CM | POA: Diagnosis present

## 2015-08-23 DIAGNOSIS — R601 Generalized edema: Secondary | ICD-10-CM | POA: Diagnosis present

## 2015-08-23 DIAGNOSIS — R17 Unspecified jaundice: Secondary | ICD-10-CM | POA: Diagnosis present

## 2015-08-23 DIAGNOSIS — J189 Pneumonia, unspecified organism: Secondary | ICD-10-CM | POA: Diagnosis present

## 2015-08-23 DIAGNOSIS — K769 Liver disease, unspecified: Secondary | ICD-10-CM

## 2015-08-23 DIAGNOSIS — R7989 Other specified abnormal findings of blood chemistry: Secondary | ICD-10-CM

## 2015-08-23 DIAGNOSIS — E8809 Other disorders of plasma-protein metabolism, not elsewhere classified: Secondary | ICD-10-CM | POA: Diagnosis present

## 2015-08-23 DIAGNOSIS — I8510 Secondary esophageal varices without bleeding: Secondary | ICD-10-CM | POA: Insufficient documentation

## 2015-08-23 DIAGNOSIS — I7300 Raynaud's syndrome without gangrene: Secondary | ICD-10-CM | POA: Diagnosis present

## 2015-08-23 DIAGNOSIS — R0902 Hypoxemia: Secondary | ICD-10-CM | POA: Diagnosis present

## 2015-08-23 DIAGNOSIS — R000 Tachycardia, unspecified: Secondary | ICD-10-CM | POA: Diagnosis present

## 2015-08-23 DIAGNOSIS — D696 Thrombocytopenia, unspecified: Secondary | ICD-10-CM | POA: Diagnosis present

## 2015-08-23 DIAGNOSIS — D689 Coagulation defect, unspecified: Secondary | ICD-10-CM | POA: Diagnosis present

## 2015-08-23 DIAGNOSIS — J90 Pleural effusion, not elsewhere classified: Secondary | ICD-10-CM | POA: Diagnosis present

## 2015-08-23 LAB — COMPREHENSIVE METABOLIC PANEL
ALT: 46 U/L (ref 14–54)
AST: 171 U/L — ABNORMAL HIGH (ref 15–41)
Albumin: 2.7 g/dL — ABNORMAL LOW (ref 3.5–5.0)
Alkaline Phosphatase: 112 U/L (ref 38–126)
Anion gap: 11 (ref 5–15)
BUN: 8 mg/dL (ref 6–20)
CO2: 20 mmol/L — ABNORMAL LOW (ref 22–32)
Calcium: 8.1 mg/dL — ABNORMAL LOW (ref 8.9–10.3)
Chloride: 104 mmol/L (ref 101–111)
Creatinine, Ser: 0.37 mg/dL — ABNORMAL LOW (ref 0.44–1.00)
GFR calc Af Amer: >60 mL/min (ref >60)
GFR calc non Af Amer: >60 mL/min (ref >60)
Glucose, Bld: 97 mg/dL (ref 65–99)
Potassium: 3.2 mmol/L — ABNORMAL LOW (ref 3.5–5.1)
Sodium: 135 mmol/L (ref 135–145)
Total Bilirubin: 3.9 mg/dL — ABNORMAL HIGH (ref 0.3–1.2)
Total Protein: 7.8 g/dL (ref 6.5–8.1)

## 2015-08-23 LAB — URINALYSIS, ROUTINE W REFLEX MICROSCOPIC
Bilirubin Urine: NEGATIVE
Glucose, UA: NEGATIVE mg/dL
Hgb urine dipstick: NEGATIVE
Ketones, ur: 15 mg/dL — AB
Leukocytes, UA: NEGATIVE
Nitrite: NEGATIVE
Protein, ur: NEGATIVE mg/dL
Specific Gravity, Urine: 1.017 (ref 1.005–1.030)
pH: 5 (ref 5.0–8.0)

## 2015-08-23 LAB — CBC WITH DIFFERENTIAL/PLATELET
Basophils Absolute: 0.1 10*3/uL (ref 0.0–0.1)
Basophils Relative: 2 %
Eosinophils Absolute: 0.2 10*3/uL (ref 0.0–0.7)
Eosinophils Relative: 3 %
HCT: 33.6 % — ABNORMAL LOW (ref 36.0–46.0)
Hemoglobin: 11.5 g/dL — ABNORMAL LOW (ref 12.0–15.0)
Lymphocytes Relative: 37 %
Lymphs Abs: 2.7 10*3/uL (ref 0.7–4.0)
MCH: 36.4 pg — ABNORMAL HIGH (ref 26.0–34.0)
MCHC: 34.2 g/dL (ref 30.0–36.0)
MCV: 106.3 fL — ABNORMAL HIGH (ref 78.0–100.0)
Monocytes Absolute: 0.8 10*3/uL (ref 0.1–1.0)
Monocytes Relative: 10 %
Neutro Abs: 3.5 10*3/uL (ref 1.7–7.7)
Neutrophils Relative %: 48 %
Platelets: 133 10*3/uL — ABNORMAL LOW (ref 150–400)
RBC: 3.16 MIL/uL — ABNORMAL LOW (ref 3.87–5.11)
RDW: 16.6 % — ABNORMAL HIGH (ref 11.5–15.5)
WBC: 7.2 10*3/uL (ref 4.0–10.5)

## 2015-08-23 LAB — PROTIME-INR
INR: 1.43 (ref 0.00–1.49)
Prothrombin Time: 17.5 seconds — ABNORMAL HIGH (ref 11.6–15.2)

## 2015-08-23 LAB — TROPONIN I: Troponin I: 0.03 ng/mL (ref <0.031)

## 2015-08-23 LAB — LIPASE, BLOOD: Lipase: 73 U/L — ABNORMAL HIGH (ref 11–51)

## 2015-08-23 LAB — BRAIN NATRIURETIC PEPTIDE: B Natriuretic Peptide: 82.9 pg/mL (ref 0.0–100.0)

## 2015-08-23 LAB — AMMONIA: Ammonia: 17 umol/L (ref 9–35)

## 2015-08-23 MED ORDER — POTASSIUM CHLORIDE CRYS ER 20 MEQ PO TBCR
EXTENDED_RELEASE_TABLET | ORAL | Status: AC
Start: 2015-08-23 — End: 2015-08-23
  Administered 2015-08-23: 30 meq via ORAL
  Filled 2015-08-23: qty 2

## 2015-08-23 MED ORDER — LIDOCAINE HCL (PF) 1 % IJ SOLN
INTRAMUSCULAR | Status: AC
  Filled 2015-08-23: qty 5

## 2015-08-23 MED ORDER — LIDOCAINE HCL (PF) 1 % IJ SOLN: 5.0000 mL | Freq: Once | INTRAMUSCULAR | Status: DC

## 2015-08-23 MED ORDER — POTASSIUM CHLORIDE CRYS ER 20 MEQ PO TBCR
30.0000 meq | EXTENDED_RELEASE_TABLET | Freq: Once | ORAL | Status: AC
  Administered 2015-08-23: 30 meq via ORAL

## 2015-08-23 NOTE — Progress Notes (Signed)
[facility] Dr. Cyndie ChimeNguyen 55 year old female with no known past medical history presents with complaints of b/l lower leg and abdominal swelling over the last 3 weeks. Patient was found to be hypoxic with ambulation. Patient did not give a significant history of alcohol use. Workup revealed the before meals 7.2, hemoglobin 11.5 with elevated MCV/MCH, Plt 133, lipase 73, AST 171, ALT46. Given 30 mEq of potassium chloride to correct initial hypokalemia. Ultrasound shows ascites fluid, but poor visualization of the pancreas. Ask for patient have diagnostic paracentesis in ED. Transferring to a telemetry bed. 4 mild tachycardia vitals, but otherwise stable.

## 2015-08-23 NOTE — ED Notes (Signed)
Patient transported to Ultrasound 

## 2015-08-23 NOTE — ED Notes (Signed)
Pulse ox while ambulating 90%-92%. Heart rate while ambulating 115-120. Pt denies any dizziness or nausea while ambulating.

## 2015-08-23 NOTE — ED Notes (Addendum)
Patient states that  For the last week she started to have a "swollen" stomach and swelling to her bilateral legs. Patient has noted abdominal distention and bilateral legs swelling and reddness. No noted distress at this time, No noted jaunidice

## 2015-08-23 NOTE — ED Notes (Addendum)
Pt sitting up in bed NAD. Talking with family. Pt states she does get ShOB at times because her abd pushes o;n her diaphragm

## 2015-08-23 NOTE — ED Provider Notes (Signed)
CSN: [id number]     Arrival date & time 08/23/15  1441 History   First MD Initiated Contact with Patient 08/23/15 1524     Chief Complaint  Patient presents with  . Leg Swelling     (Consider location/radiation/quality/duration/timing/severity/associated sxs/prior Treatment) HPI Comments: AZZIE THIEM is a 55 y.o. female with history of abdominal hysterectomy and heart murmur presents to ED with complaint of abdominal distension and bilateral lower extremity swelling. She initially noticed the abdominal symptoms approximately 3 weeks ago in which she described feeling as though her abdominal muscles gave out. Subsequently she noticed abdominal distension.  She denies urinary symptoms. She denies abdominal pain, nausea, vomiting, constipation, or diarrhea. No blood in stool. She has some associated shortness of breath, which she attributes to the abdominal distension. She denies chest pain. She is eating and drinking. She is having regular bowel movements and producing urine. She denies fever, chills, or night sweats. No rashes. Lower extremity distension started approximately 4-5 days ago with associated skin changes.    The history is provided by the patient.    Past Medical History  Diagnosis Date  . Family history of adverse reaction to anesthesia     cousin had a difficult time waking up after appendectomy  . Heart murmur     "diagnosed with a very slight heart murmur in 2001 but no other doctor has been able to hear it"  . Raynaud's disease    Past Surgical History  Procedure Laterality Date  . Abdominal hysterectomy    . Orif humerus fracture Right 05/23/2015    Procedure: OPEN REDUCTION INTERNAL FIXATION (ORIF) RIGHT  PROXIMAL HUMERUS FRACTURE;  Surgeon: Francena Hanly, MD;  Location: [facility] OR;  Service: Orthopedics;  Laterality: Right;   History reviewed. No pertinent family history. Social History  Substance Use Topics  . Smoking status: Never Smoker   . Smokeless tobacco: None   . Alcohol Use: Yes     Comment: occasional   OB History    No data available     Review of Systems  Respiratory: Positive for shortness of breath ( secondary to abdominal distension).   Cardiovascular: Positive for leg swelling ( b/l).  Gastrointestinal: Positive for abdominal distention.  All other systems reviewed and are negative.     Allergies  Oxycodone-acetaminophen  Home Medications   Prior to Admission medications   Medication Sig Start Date End Date Taking? Authorizing Provider  diazepam (VALIUM) 5 MG tablet Take 0.5-1 tablets (2.5-5 mg total) by mouth every 6 (six) hours as needed for muscle spasms or sedation. 05/23/15   French Ana Shuford, PA-C  Multiple Vitamin (MULTIVITAMIN) tablet Take 1 tablet by mouth daily. Duterra life pack    Historical Provider, MD  NON FORMULARY Apply 1 application topically 2 (two) times daily as needed (for pain). Frankincense, Helichrysum, and Cypress with fracinated coconut oil (duterra blend)    Historical Provider, MD  ondansetron (ZOFRAN) 4 MG tablet Take 1 tablet (4 mg total) by mouth every 8 (eight) hours as needed for nausea or vomiting. 05/23/15   French Ana Shuford, PA-C  oxyCODONE-acetaminophen (PERCOCET) 5-325 MG tablet Take 1-2 tablets by mouth every 4 (four) hours as needed. 05/23/15   Tracy Shuford, PA-C   BP 144/94 mmHg  Pulse 101  Temp(Src) 98.6 F (37 C) (Oral)  Resp 17  Ht 5\' 3"  (1.6 m)  Wt 52.889 kg  BMI 20.66 kg/m2  SpO2 92% Physical Exam  Constitutional: She appears well-developed. No distress.  HENT:  Head: Normocephalic  and atraumatic.  Mouth/Throat: Uvula is midline and oropharynx is clear and moist. No trismus in the jaw. No oropharyngeal exudate.  Eyes: Conjunctivae and EOM are normal. Pupils are equal, round, and reactive to light. Right eye exhibits no discharge. Left eye exhibits no discharge. Scleral icterus is present.  Neck: Normal range of motion. Neck supple.  Cardiovascular: Normal rate, regular rhythm and  intact distal pulses.   No murmur heard. Systolic heart murmur heard.  2+ pitting edema noted in extremities b/l to her knees.   Pulmonary/Chest: Effort normal and breath sounds normal. No accessory muscle usage. No respiratory distress.  Abdominal: Bowel sounds are normal. She exhibits distension. She exhibits no pulsatile midline mass. There is no tenderness. There is no rebound and no guarding.  Distended abdomen. Positive bowel sounds. Abdomen is firm. Tympanic to percussion in RUQ, dull to percussion in RLQ, LLQ, and LUQ.   Musculoskeletal: Normal range of motion.  Lymphadenopathy:    She has no cervical adenopathy.  Neurological: She is alert. Coordination normal.  Skin: Skin is warm and dry. She is not diaphoretic.  Erythema noted on lower anterior extremities b/l. Warm to touch. No drainage noted. Erythema noted on abdomen.   Psychiatric: She has a normal mood and affect.    ED Course  Procedures (including critical care time) Labs Review Labs Reviewed  LIPASE, BLOOD - Abnormal; Notable for the following:    Lipase 73 (*)    All other components within normal limits  COMPREHENSIVE METABOLIC PANEL - Abnormal; Notable for the following:    Potassium 3.2 (*)    CO2 20 (*)    Creatinine, Ser 0.37 (*)    Calcium 8.1 (*)    Albumin 2.7 (*)    AST 171 (*)    Total Bilirubin 3.9 (*)    All other components within normal limits  CBC WITH DIFFERENTIAL/PLATELET - Abnormal; Notable for the following:    RBC 3.16 (*)    Hemoglobin 11.5 (*)    HCT 33.6 (*)    MCV 106.3 (*)    MCH 36.4 (*)    RDW 16.6 (*)    Platelets 133 (*)    All other components within normal limits  URINALYSIS, ROUTINE W REFLEX MICROSCOPIC (NOT AT [facility]) - Abnormal; Notable for the following:    Color, Urine AMBER (*)    Ketones, ur 15 (*)    All other components within normal limits  AMMONIA  BRAIN NATRIURETIC PEPTIDE  TROPONIN I    Imaging Review [place] Abdomen Complete  08/23/2015  CLINICAL DATA:   Abdominal pain and swelling for 2 weeks. Elevated bilirubin and AST level. EXAM: ABDOMEN ULTRASOUND COMPLETE COMPARISON:  08/23/2015 radiographs FINDINGS: Gallbladder: Gallbladder wall thickening, 6 mm in thickness. Shadowing small calculi in the gallbladder along with sludge. Sonographic Murphy's sign absent. Common bile duct: Diameter: 4 mm Liver: No focal lesion identified. Nodular margin, suspicious for cirrhosis. IVC: No abnormality visualized. Pancreas: Not well seen except a small portion of the pancreatic body due to overlying bowel gas. Spleen: No splenomegaly.  Spleen measures 8.6 cm vertically. Right Kidney: Length: 11.3 cm. Echogenicity within normal limits. No mass or hydronephrosis visualized. Left Kidney: Length: 11.2 cm. Echogenicity within normal limits. No mass or hydronephrosis visualized. Abdominal aorta: No aneurysm visualized. Other findings: Large volume ascites.  Bilateral pleural effusions. IMPRESSION: 1. Shrunken nodular liver favoring cirrhosis.  No splenomegaly seen. 2. Ascites and bilateral pleural effusions. 3. Cholelithiasis also with sludge in the gallbladder. Prominent gallbladder wall thickening,  although this could be due to the patient's hypoalbuminemia rather than necessarily being from inflammation. 4. Ascites and pleural effusions. 5. Poor visualization of the pancreas. Electronically Signed   By: Gaylyn Rong M.D.   On: 08/23/2015 18:41   Dg Abd Acute W/chest  08/23/2015  CLINICAL DATA:  55 year old female with a history of abdominal distention and edema. EXAM: DG ABDOMEN ACUTE W/ 1V CHEST COMPARISON:  None. FINDINGS: Chest: Cardiomediastinal silhouette partially obscured by overlying lung and pleural disease. Dense opacity at the right base obscuring the right hemidiaphragm and the right heart border. Opacity at the left base in the retrocardiac region blunting the left costophrenic angle an the left cardiophrenic angle. No pneumothorax. Calcifications of the aortic  arch. Surgical changes of the right humerus for fixation of humeral fracture. Abdomen: Gas within stomach, small bowel, colon. No abnormally distended small bowel or colon. Soft tissue density at the periphery of the abdomen, with bowel gas centralized. No unexpected calcifications. No unexpected radiopaque foreign body. No displaced fracture. Mild degenerative changes of the lower lumbar spine. IMPRESSION: Chest: Bilateral right greater than left pleural effusions with associated atelectasis/consolidation. Abdomen: Nonobstructive bowel gas pattern. Soft tissue density at the periphery of the abdomen with centralized small bowel loops, potentially representing ascites. This may be better assessed with a dedicated ultrasound, or, if there is concern for acute intra-abdominal process, cross-sectional imaging with CT may be considered. Signed, Yvone Neu. Loreta Ave, DO [facility] Electronically Signed   By: Gilmer Mor D.O.   On: 08/23/2015 17:15   I have personally reviewed and evaluated these images and lab results as part of my medical decision-making.   EKG Interpretation   Date/Time:  Friday August 23 2015 16:52:27 EDT Ventricular Rate:  105 PR Interval:  170 QRS Duration: 80 QT Interval:  351 QTC Calculation: 464 R Axis:   113 Text Interpretation:  Sinus tachycardia Probable left atrial enlargement  Right axis deviation Borderline repolarization abnormality No previous  ekgs available Confirmed by NGUYEN, EMILY ([id number]) on 08/23/2015 6:41:53 PM      MDM   Final diagnoses:  None    Patient is afebrile and frail appearing. She appears older than her states age. She is tachycardic and O2 sats are mildly hypoxic at 92%. Concern for possible liver disease (?cirrhosis, ?alcoholic hepatits, ?malignancy) vs. Cardiac etiology (?new onset heart failure) vs. Kidney disease (?nephrotic syndrome) vs. malignancy. Check CBC, CMP, lipase, UA, ammonia,  troponin, BNP, EKG, acute abdomen series.   Troponin negative. BNP negative. EKG shows sinus tachycardia with borderline repolarization abnormality. Heart Score 2. U/A re-assuring. CMP shows hypokalemia, hypocalcemia, hypoalbuminemia, elevated AST, and elevated bilirubin. PO KCl given. Lipase mildly elevated at 73. Ammonia negative. Chest x-ray remarkable for bilateral pleural effusions with possible atelectasis/consolidation. Acute abdomen x-ray significant for soft tissue density at periphery of abdomen - follow up imaging recommended - will order US abdomen.   US abdomen remarkable for shrunken nodular liver suggestive of cirrhosis. Additional findings include ascites, b/l pleural effusions, cholelithiasis with GB sludge and wall thickening. Labs and clinical picture suggestive of hepatic etiology. Patient is mildly hypoxic at rest, will assess O2 sats with ambulation. May need to consider admission for new onset liver disease and if patient desats with ambulation.   Discussed patient with Dr. Theadore Nan, who has assumed care at shift change. Agrees with plan.    Lona Kettle, [place] 08/23/15 2108  Leta Baptist, MD 08/24/15 [phone]

## 2015-08-23 NOTE — ED Notes (Signed)
Paracentesis  Kit and vacutainers at bedside with lidocaine

## 2015-08-24 ENCOUNTER — Encounter (HOSPITAL_COMMUNITY): Payer: Self-pay | Admitting: *Deleted

## 2015-08-24 ENCOUNTER — Observation Stay (HOSPITAL_COMMUNITY): Payer: Self-pay

## 2015-08-24 ENCOUNTER — Inpatient Hospital Stay (HOSPITAL_COMMUNITY): Payer: Self-pay

## 2015-08-24 DIAGNOSIS — I8510 Secondary esophageal varices without bleeding: Secondary | ICD-10-CM | POA: Insufficient documentation

## 2015-08-24 DIAGNOSIS — R791 Abnormal coagulation profile: Secondary | ICD-10-CM | POA: Diagnosis present

## 2015-08-24 DIAGNOSIS — R0902 Hypoxemia: Secondary | ICD-10-CM | POA: Diagnosis present

## 2015-08-24 DIAGNOSIS — D649 Anemia, unspecified: Secondary | ICD-10-CM | POA: Diagnosis present

## 2015-08-24 DIAGNOSIS — K7460 Unspecified cirrhosis of liver: Secondary | ICD-10-CM | POA: Diagnosis present

## 2015-08-24 DIAGNOSIS — J90 Pleural effusion, not elsewhere classified: Secondary | ICD-10-CM | POA: Diagnosis present

## 2015-08-24 DIAGNOSIS — R17 Unspecified jaundice: Secondary | ICD-10-CM | POA: Diagnosis present

## 2015-08-24 DIAGNOSIS — R188 Other ascites: Secondary | ICD-10-CM

## 2015-08-24 LAB — GRAM STAIN

## 2015-08-24 LAB — COMPREHENSIVE METABOLIC PANEL
ALT: 37 U/L (ref 14–54)
AST: 125 U/L — ABNORMAL HIGH (ref 15–41)
Albumin: 2.2 g/dL — ABNORMAL LOW (ref 3.5–5.0)
Alkaline Phosphatase: 85 U/L (ref 38–126)
Anion gap: 10 (ref 5–15)
BUN: 5 mg/dL — ABNORMAL LOW (ref 6–20)
CO2: 20 mmol/L — ABNORMAL LOW (ref 22–32)
Calcium: 8.1 mg/dL — ABNORMAL LOW (ref 8.9–10.3)
Chloride: 108 mmol/L (ref 101–111)
Creatinine, Ser: 0.37 mg/dL — ABNORMAL LOW (ref 0.44–1.00)
GFR calc Af Amer: >60 mL/min (ref >60)
GFR calc non Af Amer: >60 mL/min (ref >60)
Glucose, Bld: 68 mg/dL (ref 65–99)
Potassium: 3.6 mmol/L (ref 3.5–5.1)
Sodium: 138 mmol/L (ref 135–145)
Total Bilirubin: 4.1 mg/dL — ABNORMAL HIGH (ref 0.3–1.2)
Total Protein: 6.6 g/dL (ref 6.5–8.1)

## 2015-08-24 LAB — CBC
HCT: 29.7 % — ABNORMAL LOW (ref 36.0–46.0)
Hemoglobin: 9.7 g/dL — ABNORMAL LOW (ref 12.0–15.0)
MCH: 34.5 pg — ABNORMAL HIGH (ref 26.0–34.0)
MCHC: 32.7 g/dL (ref 30.0–36.0)
MCV: 105.7 fL — ABNORMAL HIGH (ref 78.0–100.0)
Platelets: 92 10*3/uL — ABNORMAL LOW (ref 150–400)
RBC: 2.81 MIL/uL — ABNORMAL LOW (ref 3.87–5.11)
RDW: 15.9 % — ABNORMAL HIGH (ref 11.5–15.5)
WBC: 5.7 10*3/uL (ref 4.0–10.5)

## 2015-08-24 LAB — GLUCOSE, PERITONEAL FLUID: Glucose, Peritoneal Fluid: 90 mg/dL

## 2015-08-24 LAB — AMMONIA: Ammonia: 24 umol/L (ref 9–35)

## 2015-08-24 LAB — BODY FLUID CELL COUNT WITH DIFFERENTIAL
Eos, Fluid: 1 %
Lymphs, Fluid: 25 %
Monocyte-Macrophage-Serous Fluid: 71 % (ref 50–90)
Neutrophil Count, Fluid: 3 % (ref 0–25)
Total Nucleated Cell Count, Fluid: 220 cu mm (ref 0–1000)

## 2015-08-24 LAB — APTT: aPTT: 39 seconds — ABNORMAL HIGH (ref 24–37)

## 2015-08-24 LAB — PROTEIN, BODY FLUID: Total protein, fluid: <3 g/dL

## 2015-08-24 LAB — BILIRUBIN, DIRECT: Bilirubin, Direct: 1.7 mg/dL — ABNORMAL HIGH (ref 0.1–0.5)

## 2015-08-24 LAB — ALBUMIN, FLUID (OTHER): Albumin, Fluid: <1 g/dL

## 2015-08-24 LAB — LACTATE DEHYDROGENASE, PLEURAL OR PERITONEAL FLUID: LD, Fluid: 52 U/L — ABNORMAL HIGH (ref 3–23)

## 2015-08-24 LAB — FERRITIN: Ferritin: 383 ng/mL — ABNORMAL HIGH (ref 11–307)

## 2015-08-24 LAB — PROTIME-INR
INR: 1.63 — ABNORMAL HIGH (ref 0.00–1.49)
Prothrombin Time: 19.4 seconds — ABNORMAL HIGH (ref 11.6–15.2)

## 2015-08-24 MED ORDER — FOLIC ACID 1 MG PO TABS
1.0000 mg | ORAL_TABLET | Freq: Every day | ORAL | Status: DC
  Administered 2015-08-25 – 2015-08-26 (×2): 1 mg via ORAL
  Filled 2015-08-24 (×3): qty 1

## 2015-08-24 MED ORDER — VITAMIN B-1 100 MG PO TABS
100.0000 mg | ORAL_TABLET | Freq: Every day | ORAL | Status: DC
  Administered 2015-08-25 – 2015-08-26 (×2): 100 mg via ORAL
  Filled 2015-08-24 (×3): qty 1

## 2015-08-24 MED ORDER — ADULT MULTIVITAMIN W/MINERALS CH
1.0000 | ORAL_TABLET | Freq: Every day | ORAL | Status: DC
  Administered 2015-08-25 – 2015-08-26 (×2): 1 via ORAL
  Filled 2015-08-24 (×3): qty 1

## 2015-08-24 MED ORDER — ALBUMIN HUMAN 25 % IV SOLN
12.5000 g | Freq: Once | INTRAVENOUS | Status: AC
  Administered 2015-08-24: 12.5 g via INTRAVENOUS
  Filled 2015-08-24 (×2): qty 50

## 2015-08-24 MED ORDER — LORAZEPAM 2 MG/ML IJ SOLN: 1.0000 mg | Freq: Four times a day (QID) | INTRAMUSCULAR | Status: DC | PRN

## 2015-08-24 MED ORDER — THIAMINE HCL 100 MG/ML IJ SOLN: 100.0000 mg | Freq: Every day | INTRAMUSCULAR | Status: DC

## 2015-08-24 MED ORDER — TRAZODONE HCL 50 MG PO TABS: 25.0000 mg | ORAL_TABLET | Freq: Every evening | ORAL | Status: DC | PRN

## 2015-08-24 MED ORDER — SODIUM CHLORIDE 0.9% FLUSH
3.0000 mL | Freq: Two times a day (BID) | INTRAVENOUS | Status: DC
  Administered 2015-08-24 – 2015-08-25 (×3): 3 mL via INTRAVENOUS

## 2015-08-24 MED ORDER — SODIUM CHLORIDE 0.9 % IV SOLN
INTRAVENOUS | Status: DC
  Administered 2015-08-24: 04:00:00 via INTRAVENOUS

## 2015-08-24 MED ORDER — ALBUMIN HUMAN 25 % IV SOLN
25.0000 g | Freq: Once | INTRAVENOUS | Status: AC
  Administered 2015-08-24: 25 g via INTRAVENOUS
  Filled 2015-08-24: qty 100

## 2015-08-24 MED ORDER — LORAZEPAM 2 MG/ML IJ SOLN: 0.0000 mg | Freq: Four times a day (QID) | INTRAMUSCULAR | Status: AC

## 2015-08-24 MED ORDER — BARIUM SULFATE 2.1 % PO SUSP
ORAL | Status: AC
  Filled 2015-08-24: qty 2

## 2015-08-24 MED ORDER — LORAZEPAM 1 MG PO TABS: 1.0000 mg | ORAL_TABLET | Freq: Four times a day (QID) | ORAL | Status: DC | PRN

## 2015-08-24 MED ORDER — POLYETHYLENE GLYCOL 3350 17 G PO PACK: 17.0000 g | PACK | Freq: Every day | ORAL | Status: DC | PRN

## 2015-08-24 MED ORDER — LORAZEPAM 2 MG/ML IJ SOLN: 0.0000 mg | Freq: Two times a day (BID) | INTRAMUSCULAR | Status: DC

## 2015-08-24 MED ORDER — LIDOCAINE HCL (PF) 1 % IJ SOLN
INTRAMUSCULAR | Status: AC
  Filled 2015-08-24: qty 10

## 2015-08-24 NOTE — ED Notes (Signed)
suppluies for paracentisis at bedside waiting EDP

## 2015-08-24 NOTE — Progress Notes (Addendum)
PROGRESS NOTE                                                                                                                                                                                                             Patient Demographics:    Michelle Schneider, is a 55 y.o. female, DOB - 06/12/1960, ZOX:[id number]  Admit date - 08/23/2015   Admitting Physician Clydie Braunondell A Smith, MD  Outpatient Primary MD for the patient is No PCP Per Patient  LOS -   Chief Complaint  Patient presents with  . Leg Swelling       Brief Narrative   55 year old female with history of  alcohol abuse, presents with complaints of ascites, worsening lower extremity edema, status post paracentesis of 4 L in ED yesterday, plan for thoracentesis today.   Subjective:    Michelle Schneider today has, No headache, No chest pain, No abdominal pain - No Nausea, Report generalized weakness, dyspnea significantly improved after paracentesis.  Assessment  & Plan :    Active Problems:   Ascites   Hypoxia   Pleural effusion   Elevated bilirubin   Liver cirrhosis (HCC)   Elevated INR   Anemia  Ascites in the setting of new diagnosis of liver cirrhosis - Status post 4 L of ascites fluid removal - Ultrasound with evidence of liver cirrhosis, unclear etiology, most likely related to alcohol abuse - GI consulted , awaiting recommendations. - Follow on hepatitis panel - Trend LFTs  Pleural effusion - Most likely related to liver disease, minimal pleural effusion on ultrasound chest, no plans for thoracentesis.   thrombocytopenia -  in the setting of alcohol consumption and liver disease - SCD for DVT prophylaxis  Coagulopathy - In the setting of liver disease  Alcohol abbuse - On CIWA  Code Status : Full  Family Communication  : None at bedside  Disposition Plan  : Home when stable  Consults  :  GI  Procedures  : Paracentesis  DVT Prophylaxis  :   SCDs  Lab Results    Component Value Date   PLT 92* 08/24/2015    Antibiotics  :    Anti-infectives    None        Objective:   Filed Vitals:   08/24/15 0236 08/24/15 0516 08/24/15 0949 08/24/15 1129  BP: 137/84 115/70 128/69  125/75  Pulse: 105 105 115 109  Temp: 98.1 F (36.7 C) 99 F (37.2 C) 98.7 F (37.1 C) 98.8 F (37.1 C)  TempSrc: Oral Oral Oral Oral  Resp: 18 16 18 17   Height:      Weight: 47.673 kg (105 lb 1.6 oz)     SpO2: 95% 94% 95% 95%    Wt Readings from Last 3 Encounters:  08/24/15 47.673 kg (105 lb 1.6 oz)  05/23/15 44.453 kg (98 lb)  05/22/15 44.589 kg (98 lb 4.8 oz)     Intake/Output Summary (Last 24 hours) at 08/24/15 1335 Last data filed at 08/24/15 0955  Gross per 24 hour  Intake 238.33 ml  Output    520 ml  Net -281.67 ml     Physical Exam  Awake Alert, Oriented X 3, Supple Neck,No JVD,  Symmetrical Chest wall movement, Good air movement bilaterally, no wheezing  RRR,No Gallops,Rubs or new Murmurs, No Parasternal Heave +ve B.Sounds, Abd Soft, No tenderness,No rebound - guarding or rigidity. No Cyanosis, Clubbing , +2 edema bilaterally, erythema in bilateral lower extremities.   Data Review:    CBC  Recent Labs Lab 08/23/15 1641 08/24/15 0518  WBC 7.2 5.7  HGB 11.5* 9.7*  HCT 33.6* 29.7*  PLT 133* 92*  MCV 106.3* 105.7*  MCH 36.4* 34.5*  MCHC 34.2 32.7  RDW 16.6* 15.9*  LYMPHSABS 2.7  --   MONOABS 0.8  --   EOSABS 0.2  --   BASOSABS 0.1  --     Chemistries   Recent Labs Lab 08/23/15 1641 08/24/15 0518  NA 135 138  K 3.2* 3.6  CL 104 108  CO2 20* 20*  GLUCOSE 97 68  BUN 8 5*  CREATININE 0.37* 0.37*  CALCIUM 8.1* 8.1*  AST 171* 125*  ALT 46 37  ALKPHOS 112 85  BILITOT 3.9* 4.1*   ------------------------------------------------------------------------------------------------------------------ No results for input(s): CHOL, HDL, LDLCALC, TRIG, CHOLHDL, LDLDIRECT in the last 72 hours.  No results found for:  HGBA1C ------------------------------------------------------------------------------------------------------------------ No results for input(s): TSH, T4TOTAL, T3FREE, THYROIDAB in the last 72 hours.  Invalid input(s): FREET3 ------------------------------------------------------------------------------------------------------------------ No results for input(s): VITAMINB12, FOLATE, FERRITIN, TIBC, IRON, RETICCTPCT in the last 72 hours.  Coagulation profile  Recent Labs Lab 08/23/15 1641 08/24/15 0518  INR 1.43 1.63*    No results for input(s): DDIMER in the last 72 hours.  Cardiac Enzymes  Recent Labs Lab 08/23/15 1641  TROPONINI 0.03   ------------------------------------------------------------------------------------------------------------------    Component Value Date/Time   BNP 82.9 08/23/2015 1651    Inpatient Medications  Scheduled Meds: . lidocaine (PF)  5 mL Infiltration Once  . lidocaine (PF)      . sodium chloride flush  3 mL Intravenous Q12H   Continuous Infusions:  PRN Meds:.polyethylene glycol, traZODone  Micro Results Recent Results (from the past 240 hour(s))  Gram stain     Status: None   Collection Time: 08/24/15  1:15 AM  Result Value Ref Range Status   Specimen Description PERITONEAL  Final   Special Requests NONE  Final   Gram Stain   Final    CYTOSPIN SMEAR WBC PRESENT, PREDOMINANTLY MONONUCLEAR NO ORGANISMS SEEN    Report Status 08/24/2015 FINAL  Final    Radiology Reports [place] Chest  08/24/2015  CLINICAL DATA:  Ultrasound of the chest prior to possible thoracentesis. Status post paracentesis yesterday. EXAM: CHEST ULTRASOUND COMPARISON:  Chest X-ray done 08/23/2015. FINDINGS: Limited ultrasound survey for pleural fluid was performed of the right chest. There  is a minimal amount of pleural fluid. There is no significant pocket of fluid to allow for a safe approach for thoracentesis. IMPRESSION: Minimal pleural effusion. Read by:  Corrin Parker, PA-C Electronically Signed   By: Malachy Moan M.D.   On: 08/24/2015 10:51   US Abdomen Complete  08/23/2015  CLINICAL DATA:  Abdominal pain and swelling for 2 weeks. Elevated bilirubin and AST level. EXAM: ABDOMEN ULTRASOUND COMPLETE COMPARISON:  08/23/2015 radiographs FINDINGS: Gallbladder: Gallbladder wall thickening, 6 mm in thickness. Shadowing small calculi in the gallbladder along with sludge. Sonographic Murphy's sign absent. Common bile duct: Diameter: 4 mm Liver: No focal lesion identified. Nodular margin, suspicious for cirrhosis. IVC: No abnormality visualized. Pancreas: Not well seen except a small portion of the pancreatic body due to overlying bowel gas. Spleen: No splenomegaly.  Spleen measures 8.6 cm vertically. Right Kidney: Length: 11.3 cm. Echogenicity within normal limits. No mass or hydronephrosis visualized. Left Kidney: Length: 11.2 cm. Echogenicity within normal limits. No mass or hydronephrosis visualized. Abdominal aorta: No aneurysm visualized. Other findings: Large volume ascites.  Bilateral pleural effusions. IMPRESSION: 1. Shrunken nodular liver favoring cirrhosis.  No splenomegaly seen. 2. Ascites and bilateral pleural effusions. 3. Cholelithiasis also with sludge in the gallbladder. Prominent gallbladder wall thickening, although this could be due to the patient's hypoalbuminemia rather than necessarily being from inflammation. 4. Ascites and pleural effusions. 5. Poor visualization of the pancreas. Electronically Signed   By: Gaylyn Rong M.D.   On: 08/23/2015 18:41   Dg Abd Acute W/chest  08/23/2015  CLINICAL DATA:  55 year old female with a history of abdominal distention and edema. EXAM: DG ABDOMEN ACUTE W/ 1V CHEST COMPARISON:  None. FINDINGS: Chest: Cardiomediastinal silhouette partially obscured by overlying lung and pleural disease. Dense opacity at the right base obscuring the right hemidiaphragm and the right heart border. Opacity at the left base in  the retrocardiac region blunting the left costophrenic angle an the left cardiophrenic angle. No pneumothorax. Calcifications of the aortic arch. Surgical changes of the right humerus for fixation of humeral fracture. Abdomen: Gas within stomach, small bowel, colon. No abnormally distended small bowel or colon. Soft tissue density at the periphery of the abdomen, with bowel gas centralized. No unexpected calcifications. No unexpected radiopaque foreign body. No displaced fracture. Mild degenerative changes of the lower lumbar spine. IMPRESSION: Chest: Bilateral right greater than left pleural effusions with associated atelectasis/consolidation. Abdomen: Nonobstructive bowel gas pattern. Soft tissue density at the periphery of the abdomen with centralized small bowel loops, potentially representing ascites. This may be better assessed with a dedicated ultrasound, or, if there is concern for acute intra-abdominal process, cross-sectional imaging with CT may be considered. Signed, Yvone Neu. Loreta Ave, DO [facility] Radiology Electronically Signed   By: Gilmer Mor D.O.   On: 08/23/2015 17:15     ELGERGAWY, DAWOOD M.D on 08/24/2015 at 1:35 PM  Between 7am to 7pm - Pager - [phone]  After 7pm go to [email or web address] - password [facility]  [facility] -  Office  [phone]

## 2015-08-24 NOTE — ED Notes (Signed)
4 liters yellow fluid removed from abd specimens sent to lab. Mrs Michelle Schneider tolerated fluid removal well vital signs remained WNL

## 2015-08-24 NOTE — H&P (Signed)
History and Physical    Michelle Schneider ZOX:[id number] DOB: 11/13/1960 DOA: 08/23/2015  PCP: No PCP Per Patient  Patient coming from: home Chief Complaint: "It felt like my belly just let go."  HPI: Michelle Schneider is a 55 y.o. female with medical history significant for raynaud's.patient presented to outside emergency room complaining of abdominal distention and lower leg swelling. Patient states that this occurred over approximately 3 weeks.at that time her abdomen started to give out. Patient has no history of ascites. Patient began to develop lower extremity  Swelling 2 days ago and it has been steadily worsening. Patient has no history of liver disease. Patient has no history of IV drug use. Patient denies using dirty tattoo parlors. Patient denies any past blood transfusions. Patient is unable to eat and drink normally.  Patient denies any abdominal trauma.  She denies fevers chills nausea vomiting diarrhea.  ED Course: paracentesis performed in emergency room and 4 L taken off.  Review of Systems: As per HPI otherwise 10 point review of systems negative.    Past Medical History  Diagnosis Date  . Family history of adverse reaction to anesthesia     cousin had a difficult time waking up after appendectomy  . Heart murmur     "diagnosed with a very slight heart murmur in 2001 but no other doctor has been able to hear it"  . Raynaud's disease     Past Surgical History  Procedure Laterality Date  . Abdominal hysterectomy    . Orif humerus fracture Right 05/23/2015    Procedure: OPEN REDUCTION INTERNAL FIXATION (ORIF) RIGHT  PROXIMAL HUMERUS FRACTURE;  Surgeon: Francena HanlyKevin Supple, MD;  Location: [facility] OR;  Service: Orthopedics;  Laterality: Right;     reports that she has never smoked. She does not have any smokeless tobacco history on file. She reports that she drinks alcohol. Her drug history is not on file.  Allergies  Allergen Reactions  . Oxycodone-Acetaminophen Nausea And Vomiting  and Other (See Comments)    Hallucinations and bad dreams    Family History  Problem Relation Age of Onset  . Stroke Neg Hx      Prior to Admission medications   Medication Sig Start Date End Date Taking? Authorizing Provider  diazepam (VALIUM) 5 MG tablet Take 0.5-1 tablets (2.5-5 mg total) by mouth every 6 (six) hours as needed for muscle spasms or sedation. 05/23/15  Yes Tracy Shuford, PA-C  NON FORMULARY Apply 1 application topically 2 (two) times daily as needed (for pain). Frankincense, Helichrysum, and Cypress with fracinated coconut oil (duterra blend)   Yes Historical Provider, MD  ondansetron (ZOFRAN) 4 MG tablet Take 1 tablet (4 mg total) by mouth every 8 (eight) hours as needed for nausea or vomiting. 05/23/15  Yes Tracy Shuford, PA-C  oxyCODONE-acetaminophen (PERCOCET) 5-325 MG tablet Take 1-2 tablets by mouth every 4 (four) hours as needed. 05/23/15  Yes Tracy Shuford, PA-C  Multiple Vitamin (MULTIVITAMIN) tablet Take 1 tablet by mouth daily. Duterra life pack    Historical Provider, MD    Physical Exam:  Constitutional: NAD, calm, comfortable Filed Vitals:   08/24/15 0115 08/24/15 0124 08/24/15 0236 08/24/15 0516  BP: 123/81 115/81 137/84 115/70  Pulse: 108 106 105 105  Temp:   98.1 F (36.7 C) 99 F (37.2 C)  TempSrc:   Oral Oral  Resp: 14 18 18 16   Height:      Weight:   47.673 kg (105 lb 1.6 oz)   SpO2: 92%  94% 95% 94%   Eyes: PERRL, lids and conjunctivae normal ENMT: Mucous membranes are moist. Posterior pharynx clear of any exudate or lesions.Normal dentition.  Neck: normal, supple, no masses, no thyromegaly Respiratory: clear to auscultation bilaterally, no wheezing, no crackles. Normal respiratory effort. No accessory muscle use.  Cardiovascular: Regular rate and rhythm, no murmurs / rubs / gallops. No extremity edema. 2+ pedal pulses. No carotid bruits.  Abdomen: no tenderness, no masses palpated. No hepatosplenomegaly. Bowel sounds positive.    Musculoskeletal: no clubbing / cyanosis. No joint deformity upper and lower extremities. Good ROM, no contractures. Normal muscle tone.  Skin: no rashes, lesions, ulcers. No induration.bandage on lower left abdomen. Neurologic: CN 2-12 grossly intact. Sensation intact, DTR normal. Strength 5/5 in all 4.  Psychiatric: Normal judgment and insight. Alert and oriented x 3. Normal mood.   Labs on Admission: I have personally reviewed following labs and imaging studies  CBC:  Recent Labs Lab 08/23/15 1641 08/24/15 0518  WBC 7.2 5.7  NEUTROABS 3.5  --   HGB 11.5* 9.7*  HCT 33.6* 29.7*  MCV 106.3* 105.7*  PLT 133* PENDING   Basic Metabolic Panel:  Recent Labs Lab 08/23/15 1641 08/24/15 0518  NA 135 138  K 3.2* 3.6  CL 104 108  CO2 20* 20*  GLUCOSE 97 68  BUN 8 5*  CREATININE 0.37* 0.37*  CALCIUM 8.1* 8.1*   GFR: Estimated Creatinine Clearance: 58.4 mL/min (by C-G formula based on Cr of 0.37). Liver Function Tests:  Recent Labs Lab 08/23/15 1641 08/24/15 0518  AST 171* 125*  ALT 46 37  ALKPHOS 112 85  BILITOT 3.9* 4.1*  PROT 7.8 6.6  ALBUMIN 2.7* 2.2*    Recent Labs Lab 08/23/15 1641  LIPASE 73*    Recent Labs Lab 08/23/15 1641  AMMONIA 17   Coagulation Profile:  Recent Labs Lab 08/23/15 1641 08/24/15 0518  INR 1.43 1.63*   Cardiac Enzymes:  Recent Labs Lab 08/23/15 1641  TROPONINI 0.03   BNP (last 3 results) No results for input(s): PROBNP in the last 8760 hours. HbA1C: No results for input(s): HGBA1C in the last 72 hours. CBG: No results for input(s): GLUCAP in the last 168 hours. Lipid Profile: No results for input(s): CHOL, HDL, LDLCALC, TRIG, CHOLHDL, LDLDIRECT in the last 72 hours. Thyroid Function Tests: No results for input(s): TSH, T4TOTAL, FREET4, T3FREE, THYROIDAB in the last 72 hours. Anemia Panel: No results for input(s): VITAMINB12, FOLATE, FERRITIN, TIBC, IRON, RETICCTPCT in the last 72 hours. Urine analysis:     Component Value Date/Time   COLORURINE AMBER* 08/23/2015 1745   APPEARANCEUR CLEAR 08/23/2015 1745   LABSPEC 1.017 08/23/2015 1745   PHURINE 5.0 08/23/2015 1745   GLUCOSEU NEGATIVE 08/23/2015 1745   HGBUR NEGATIVE 08/23/2015 1745   BILIRUBINUR NEGATIVE 08/23/2015 1745   KETONESUR 15* 08/23/2015 1745   PROTEINUR NEGATIVE 08/23/2015 1745   NITRITE NEGATIVE 08/23/2015 1745   LEUKOCYTESUR NEGATIVE 08/23/2015 1745   Sepsis Labs: !!!!!!!!!!!!!!!!!!!!!!!!!!!!!!!!!!!!!!!!!!!! (procalcitonin:4,lacticidven:4) ) Recent Results (from the past 240 hour(s))  Gram stain     Status: None   Collection Time: 08/24/15  1:15 AM  Result Value Ref Range Status   Specimen Description PERITONEAL  Final   Special Requests NONE  Final   Gram Stain   Final    CYTOSPIN SMEAR WBC PRESENT, PREDOMINANTLY MONONUCLEAR NO ORGANISMS SEEN    Report Status 08/24/2015 FINAL  Final     Radiological Exams on Admission: US Abdomen Complete  08/23/2015  CLINICAL DATA:  Abdominal pain and swelling for 2 weeks. Elevated bilirubin and AST level. EXAM: ABDOMEN ULTRASOUND COMPLETE COMPARISON:  08/23/2015 radiographs FINDINGS: Gallbladder: Gallbladder wall thickening, 6 mm in thickness. Shadowing small calculi in the gallbladder along with sludge. Sonographic Murphy's sign absent. Common bile duct: Diameter: 4 mm Liver: No focal lesion identified. Nodular margin, suspicious for cirrhosis. IVC: No abnormality visualized. Pancreas: Not well seen except a small portion of the pancreatic body due to overlying bowel gas. Spleen: No splenomegaly.  Spleen measures 8.6 cm vertically. Right Kidney: Length: 11.3 cm. Echogenicity within normal limits. No mass or hydronephrosis visualized. Left Kidney: Length: 11.2 cm. Echogenicity within normal limits. No mass or hydronephrosis visualized. Abdominal aorta: No aneurysm visualized. Other findings: Large volume ascites.  Bilateral pleural effusions. IMPRESSION: 1. Shrunken nodular  liver favoring cirrhosis.  No splenomegaly seen. 2. Ascites and bilateral pleural effusions. 3. Cholelithiasis also with sludge in the gallbladder. Prominent gallbladder wall thickening, although this could be due to the patient's hypoalbuminemia rather than necessarily being from inflammation. 4. Ascites and pleural effusions. 5. Poor visualization of the pancreas. Electronically Signed   By: Gaylyn Rong M.D.   On: 08/23/2015 18:41   Dg Abd Acute W/chest  08/23/2015  CLINICAL DATA:  55 year old female with a history of abdominal distention and edema. EXAM: DG ABDOMEN ACUTE W/ 1V CHEST COMPARISON:  None. FINDINGS: Chest: Cardiomediastinal silhouette partially obscured by overlying lung and pleural disease. Dense opacity at the right base obscuring the right hemidiaphragm and the right heart border. Opacity at the left base in the retrocardiac region blunting the left costophrenic angle an the left cardiophrenic angle. No pneumothorax. Calcifications of the aortic arch. Surgical changes of the right humerus for fixation of humeral fracture. Abdomen: Gas within stomach, small bowel, colon. No abnormally distended small bowel or colon. Soft tissue density at the periphery of the abdomen, with bowel gas centralized. No unexpected calcifications. No unexpected radiopaque foreign body. No displaced fracture. Mild degenerative changes of the lower lumbar spine. IMPRESSION: Chest: Bilateral right greater than left pleural effusions with associated atelectasis/consolidation. Abdomen: Nonobstructive bowel gas pattern. Soft tissue density at the periphery of the abdomen with centralized small bowel loops, potentially representing ascites. This may be better assessed with a dedicated ultrasound, or, if there is concern for acute intra-abdominal process, cross-sectional imaging with CT may be considered. Signed, Yvone Neu. Loreta Ave, DO [facility] Radiology Electronically  Signed   By: Gilmer Mor D.O.   On: 08/23/2015 17:15    EKG: Independently reviewed. Sinus tachycardia.  Assessment/Plan Active Problems:   Ascites   Hypoxia   Ascites Status post 4 L of ascites fluid removed Gave verbal pharmacy for 25 g of 25% albumin IV Likely liver cirrhosis with unknown cause. Please call GI consult the morning Patient wishes for workup to take place as fast as possible so she can return home to care for her  Family member Acute hepatitis panel  Will trend INR Direct bili ordered  Lab abn Corrected calcium normal  Hypoxia Will score for PE 1.5, low risk Chest x-ray negative Likely secondary to extreme ascites and pleural effusions Consult placed for ultrasound-guided thoracentesis  DVT prophylaxis:  Code Status: full Family Communication: unavailable Disposition Plan: home today Consults called: none Admission status: tele obs   Haydee Salter MD [facility] Pager [phone]  If 7PM-7AM, please contact night-coverage [email or web address] Password [email or web address]  08/24/2015, 6:59 AM

## 2015-08-24 NOTE — Progress Notes (Signed)
Limited US of the chest revealed minimal pleural fluid.  There is no safe pocket of fluid to allow for safe approach for thoracentesis secondary to lung floating into view with respiration.  Patient denies shortness of breath and is in agreement to avoid thoracentesis if it is not safe.  WENDY S BLAIR PA-C 08/24/2015 10:51 AM

## 2015-08-24 NOTE — Progress Notes (Signed)
New Admission Note:   [facility] & O x 4 Telemetry: Tele #[id number] Assessment: Completed Skin:  Paracentesis wound to LLQ, BLE are dark red IV: Rt AC Pain: Denies Tubes: None Safety Measures: Safety Fall Prevention Plan has been given, discussed and signed Admission: Completed [address] Orientation: Patient has been orientated to the room, unit and staff.  Family:  From home with brother whom she care for.  Orders have been reviewed and implemented. Will continue to monitor the patient. Call light has been placed within reach and bed alarm has been activated.   Bernie CoveyKimberly Gengler RN- Marsa ArisBC, [place] Phone number: [phone]

## 2015-08-24 NOTE — Consult Note (Signed)
CROSS COVER [facility] Reason for Consult: Abnormal LFT's with ascites. Referring Physician: THP  Michelle Schneider is an 55 y.o. female.  HPI: 65 -year-old white female, presents to the emergency room with a 2 day history of worsening pedal edema and increasing abdominal girth. Found to have ascites with hyperbilirubinemia mild thrombocytopenia and a nodular liver abdominal ultrasound done while she was in the ER. 4 L of ascitic fluid were removed by paracentesis yesterday and she schedule for repeat paracentesis today. She claims she was in her usual state of health till her symptoms start symptoms started about 48 hours ago she admits to drinking one to 2 glasses of wine per day. And has never indulged in excessive alcohol use. She denies having any abdominal pain nausea vomiting melena or hematochezia.  She is a fairly good appetite and her weight has been stable until a couple of days ago when she developed ascites and pedal edema. She claims she has been relatively healthy the [employer] symptoms came on. There is no known family history of liver disease. She has never had a blood transfusion she denies IV drug abuse or recreational drug use. There is no history of viral/infectious  hepatitis. She was also found to have cholelithiasis and gallbladder sludge and gallbladder wall thickening on abdominal ultrasound done yesterday along with bilateral pleural effusions. There is no history of fever chills or rigors associated with her symptoms.   Past Medical History  Diagnosis Date  . Raynaud's disease    Past Surgical History  Procedure Laterality Date  . Abdominal hysterectomy    . Orif humerus fracture Right 05/23/2015    Procedure: OPEN REDUCTION INTERNAL FIXATION (ORIF) RIGHT  PROXIMAL HUMERUS FRACTURE;  Surgeon: Justice Britain, MD;  Location: [facility];  Service: Orthopedics;  Laterality: Right;   Family History  Problem Relation Age of Onset  . Stroke Neg Hx    Social History:  reports that she has  never smoked. She does not have any smokeless tobacco history on file. She reports that she drinks alcohol. Her drug history is not on file.  Allergies:  Allergies  Allergen Reactions  . Oxycodone-Acetaminophen Nausea And Vomiting and Other (See Comments)    Hallucinations and bad dreams   Medications: I have reviewed the patient's current medications.  Results for orders placed or performed during the hospital encounter of 08/23/15 (from the past 48 hour(s))  Ammonia     Status: None   Collection Time: 08/23/15  4:41 PM  Result Value Ref Range   Ammonia 17 9 - 35 umol/L  Lipase, blood     Status: Abnormal   Collection Time: 08/23/15  4:41 PM  Result Value Ref Range   Lipase 73 (H) 11 - 51 U/L  Comprehensive metabolic panel     Status: Abnormal   Collection Time: 08/23/15  4:41 PM  Result Value Ref Range   Sodium 135 135 - 145 mmol/L   Potassium 3.2 (L) 3.5 - 5.1 mmol/L   Chloride 104 101 - 111 mmol/L   CO2 20 (L) 22 - 32 mmol/L   Glucose, Bld 97 65 - 99 mg/dL   BUN 8 6 - 20 mg/dL   Creatinine, Ser 0.37 (L) 0.44 - 1.00 mg/dL   Calcium 8.1 (L) 8.9 - 10.3 mg/dL   Total Protein 7.8 6.5 - 8.1 g/dL   Albumin 2.7 (L) 3.5 - 5.0 g/dL   AST 171 (H) 15 - 41 U/L   ALT 46 14 - 54 U/L  Alkaline Phosphatase 112 38 - 126 U/L   Total Bilirubin 3.9 (H) 0.3 - 1.2 mg/dL   GFR calc non Af Amer >60 >60 mL/min   GFR calc Af Amer >60 >60 mL/min    Comment: (NOTE) The eGFR has been calculated using the CKD EPI equation. This calculation has not been validated in all clinical situations. eGFR's persistently <60 mL/min signify possible Chronic Kidney Disease.    Anion gap 11 5 - 15  Troponin I     Status: None   Collection Time: 08/23/15  4:41 PM  Result Value Ref Range   Troponin I 0.03 <0.031 ng/mL    Comment:        NO INDICATION OF MYOCARDIAL INJURY.   CBC with Differential     Status: Abnormal   Collection Time: 08/23/15  4:41 PM  Result Value Ref Range   WBC 7.2 4.0 - 10.5 K/uL    RBC 3.16 (L) 3.87 - 5.11 MIL/uL   Hemoglobin 11.5 (L) 12.0 - 15.0 g/dL   HCT 33.6 (L) 36.0 - 46.0 %   MCV 106.3 (H) 78.0 - 100.0 fL   MCH 36.4 (H) 26.0 - 34.0 pg   MCHC 34.2 30.0 - 36.0 g/dL   RDW 16.6 (H) 11.5 - 15.5 %   Platelets 133 (L) 150 - 400 K/uL   Neutrophils Relative % 48 %   Neutro Abs 3.5 1.7 - 7.7 K/uL   Lymphocytes Relative 37 %   Lymphs Abs 2.7 0.7 - 4.0 K/uL   Monocytes Relative 10 %   Monocytes Absolute 0.8 0.1 - 1.0 K/uL   Eosinophils Relative 3 %   Eosinophils Absolute 0.2 0.0 - 0.7 K/uL   Basophils Relative 2 %   Basophils Absolute 0.1 0.0 - 0.1 K/uL  Protime-INR     Status: Abnormal   Collection Time: 08/23/15  4:41 PM  Result Value Ref Range   Prothrombin Time 17.5 (H) 11.6 - 15.2 seconds   INR 1.43 0.00 - 1.49  Brain natriuretic peptide     Status: None   Collection Time: 08/23/15  4:51 PM  Result Value Ref Range   B Natriuretic Peptide 82.9 0.0 - 100.0 pg/mL  Urinalysis, Routine w reflex microscopic     Status: Abnormal   Collection Time: 08/23/15  5:45 PM  Result Value Ref Range   Color, Urine AMBER (A) YELLOW    Comment: BIOCHEMICALS MAY BE AFFECTED BY COLOR   APPearance CLEAR CLEAR   Specific Gravity, Urine 1.017 1.005 - 1.030   pH 5.0 5.0 - 8.0   Glucose, UA NEGATIVE NEGATIVE mg/dL   Hgb urine dipstick NEGATIVE NEGATIVE   Bilirubin Urine NEGATIVE NEGATIVE   Ketones, ur 15 (A) NEGATIVE mg/dL   Protein, ur NEGATIVE NEGATIVE mg/dL   Nitrite NEGATIVE NEGATIVE   Leukocytes, UA NEGATIVE NEGATIVE    Comment: MICROSCOPIC NOT DONE ON URINES WITH NEGATIVE PROTEIN, BLOOD, LEUKOCYTES, NITRITE, OR GLUCOSE <1000 mg/dL.  Lactate dehydrogenase, Peritoneal fluid     Status: Abnormal   Collection Time: 08/24/15  1:15 AM  Result Value Ref Range   LD, Fluid 52 (H) 3 - 23 U/L    Comment: (NOTE) Results should be evaluated in conjunction with serum values Performed at [facility]    Fluid Type-FLDH PERITONEAL CAVITY   Glucose, Peritoneal fluid      Status: None   Collection Time: 08/24/15  1:15 AM  Result Value Ref Range   Glucose, Peritoneal Fluid 90 mg/dL    Comment: NO  NORMAL RANGE ESTABLISHED FOR THIS TEST Performed at [facility]   Protein, Peritoneal fluid     Status: None   Collection Time: 08/24/15  1:15 AM  Result Value Ref Range   Total protein, fluid <3.0 g/dL    Comment: (NOTE) No normal range established for this test Results should be evaluated in conjunction with serum values Performed at [facility]    Fluid Type-FTP PERITONEAL CAVITY   Albumin, Peritoneal fluid     Status: None   Collection Time: 08/24/15  1:15 AM  Result Value Ref Range   Albumin, Fluid <1.0 g/dL    Comment: (NOTE) No normal range established for this test Results should be evaluated in conjunction with serum values Performed at [facility]    Fluid Type-FALB PERITONEAL CAVITY   Body fluid cell count with differential     Status: Abnormal   Collection Time: 08/24/15  1:15 AM  Result Value Ref Range   Fluid Type-FCT PERITONEAL CAVITY     Comment: CORRECTED ON 06/10 AT 0207: PREVIOUSLY REPORTED AS Peritoneal   Color, Fluid YELLOW YELLOW   Appearance, Fluid HAZY (A) CLEAR   WBC, Fluid 220 0 - 1000 cu mm   Neutrophil Count, Fluid 3 0 - 25 %   Lymphs, Fluid 25 %   Monocyte-Macrophage-Serous Fluid 71 50 - 90 %   Eos, Fluid 1 %   Other Cells, Fluid OTHER CELLS IDENTIFIED AS MESOTHELIAL CELLS %    Comment: Performed at [facility]  Gram stain     Status: None   Collection Time: 08/24/15  1:15 AM  Result Value Ref Range   Specimen Description PERITONEAL    Special Requests NONE    Gram Stain      CYTOSPIN SMEAR WBC PRESENT, PREDOMINANTLY MONONUCLEAR NO ORGANISMS SEEN    Report Status 08/24/2015 FINAL   Bilirubin, direct     Status: Abnormal   Collection Time: 08/24/15  5:18 AM  Result Value Ref Range   Bilirubin, Direct 1.7 (H) 0.1 - 0.5 mg/dL  Comprehensive metabolic panel     Status: Abnormal    Collection Time: 08/24/15  5:18 AM  Result Value Ref Range   Sodium 138 135 - 145 mmol/L   Potassium 3.6 3.5 - 5.1 mmol/L   Chloride 108 101 - 111 mmol/L   CO2 20 (L) 22 - 32 mmol/L   Glucose, Bld 68 65 - 99 mg/dL   BUN 5 (L) 6 - 20 mg/dL   Creatinine, Ser 0.37 (L) 0.44 - 1.00 mg/dL   Calcium 8.1 (L) 8.9 - 10.3 mg/dL   Total Protein 6.6 6.5 - 8.1 g/dL   Albumin 2.2 (L) 3.5 - 5.0 g/dL   AST 125 (H) 15 - 41 U/L   ALT 37 14 - 54 U/L   Alkaline Phosphatase 85 38 - 126 U/L   Total Bilirubin 4.1 (H) 0.3 - 1.2 mg/dL   GFR calc non Af Amer >60 >60 mL/min   GFR calc Af Amer >60 >60 mL/min    Comment: (NOTE) The eGFR has been calculated using the CKD EPI equation. This calculation has not been validated in all clinical situations. eGFR's persistently <60 mL/min signify possible Chronic Kidney Disease.    Anion gap 10 5 - 15  CBC     Status: Abnormal   Collection Time: 08/24/15  5:18 AM  Result Value Ref Range   WBC 5.7 4.0 - 10.5 K/uL   RBC 2.81 (L) 3.87 - 5.11 MIL/uL  Hemoglobin 9.7 (L) 12.0 - 15.0 g/dL   HCT 29.7 (L) 36.0 - 46.0 %   MCV 105.7 (H) 78.0 - 100.0 fL   MCH 34.5 (H) 26.0 - 34.0 pg   MCHC 32.7 30.0 - 36.0 g/dL   RDW 15.9 (H) 11.5 - 15.5 %   Platelets 92 (L) 150 - 400 K/uL    Comment: REPEATED TO VERIFY PLATELET COUNT CONFIRMED BY SMEAR   Protime-INR     Status: Abnormal   Collection Time: 08/24/15  5:18 AM  Result Value Ref Range   Prothrombin Time 19.4 (H) 11.6 - 15.2 seconds   INR 1.63 (H) 0.00 - 1.49  APTT     Status: Abnormal   Collection Time: 08/24/15  5:18 AM  Result Value Ref Range   aPTT 39 (H) 24 - 37 seconds    Comment:        IF BASELINE aPTT IS ELEVATED, SUGGEST PATIENT RISK ASSESSMENT BE USED TO DETERMINE APPROPRIATE ANTICOAGULANT THERAPY.    US Abdomen Complete  08/23/2015  CLINICAL DATA:  Abdominal pain and swelling for 2 weeks. Elevated bilirubin and AST level. EXAM: ABDOMEN ULTRASOUND COMPLETE COMPARISON:  08/23/2015 radiographs FINDINGS:  Gallbladder: Gallbladder wall thickening, 6 mm in thickness. Shadowing small calculi in the gallbladder along with sludge. Sonographic Murphy's sign absent. Common bile duct: Diameter: 4 mm Liver: No focal lesion identified. Nodular margin, suspicious for cirrhosis. IVC: No abnormality visualized. Pancreas: Not well seen except a small portion of the pancreatic body due to overlying bowel gas. Spleen: No splenomegaly.  Spleen measures 8.6 cm vertically. Right Kidney: Length: 11.3 cm. Echogenicity within normal limits. No mass or hydronephrosis visualized. Left Kidney: Length: 11.2 cm. Echogenicity within normal limits. No mass or hydronephrosis visualized. Abdominal aorta: No aneurysm visualized. Other findings: Large volume ascites.  Bilateral pleural effusions. IMPRESSION: 1. Shrunken nodular liver favoring cirrhosis.  No splenomegaly seen. 2. Ascites and bilateral pleural effusions. 3. Cholelithiasis also with sludge in the gallbladder. Prominent gallbladder wall thickening, although this could be due to the patient's hypoalbuminemia rather than necessarily being from inflammation. 4. Ascites and pleural effusions. 5. Poor visualization of the pancreas. Electronically Signed   By: Van Clines M.D.   On: 08/23/2015 18:41   Dg Abd Acute W/chest  08/23/2015  CLINICAL DATA:  55 year old female with a history of abdominal distention and edema. EXAM: DG ABDOMEN ACUTE W/ 1V CHEST COMPARISON:  None. FINDINGS: Chest: Cardiomediastinal silhouette partially obscured by overlying lung and pleural disease. Dense opacity at the right base obscuring the right hemidiaphragm and the right heart border. Opacity at the left base in the retrocardiac region blunting the left costophrenic angle an the left cardiophrenic angle. No pneumothorax. Calcifications of the aortic arch. Surgical changes of the right humerus for fixation of humeral fracture. Abdomen: Gas within stomach, small bowel, colon. No abnormally distended small  bowel or colon. Soft tissue density at the periphery of the abdomen, with bowel gas centralized. No unexpected calcifications. No unexpected radiopaque foreign body. No displaced fracture. Mild degenerative changes of the lower lumbar spine. IMPRESSION: Chest: Bilateral right greater than left pleural effusions with associated atelectasis/consolidation. Abdomen: Nonobstructive bowel gas pattern. Soft tissue density at the periphery of the abdomen with centralized small bowel loops, potentially representing ascites. This may be better assessed with a dedicated ultrasound, or, if there is concern for acute intra-abdominal process, cross-sectional imaging with CT may be considered. Signed, Dulcy Fanny. Earleen Newport, DO [facility] Electronically Signed   By:  Corrie Mckusick D.O.   On: 08/23/2015 17:15   Review of Systems  Constitutional: Positive for malaise/fatigue. Negative for fever, chills, weight loss and diaphoresis.  HENT: Negative.   Eyes: Negative.   Respiratory: Negative.   Cardiovascular: Negative.   Gastrointestinal: Negative for heartburn, nausea, vomiting, abdominal pain, diarrhea, constipation, blood in stool and melena.  Genitourinary: Negative.   Musculoskeletal: Positive for back pain and joint pain.  Skin: Negative.   Neurological: Positive for weakness.  Endo/Heme/Allergies: Negative.   Psychiatric/Behavioral: Negative.    Blood pressure 115/70, pulse 105, temperature 99 F (37.2 C), temperature source Oral, resp. rate 16, height _0  (1.6 m), weight 47.673 kg (105 lb 1.6 oz), SpO2 94 %. Physical Exam  Constitutional: She is oriented to person, place, and time.  HENT:  Head: Normocephalic and atraumatic.  Eyes: Conjunctivae and EOM are normal. Pupils are equal, round, and reactive to light.  Neck: Normal range of motion. Neck supple.  Cardiovascular: Normal rate and regular rhythm.   Respiratory: Effort normal and breath sounds  normal.  GI: Soft. Bowel sounds are normal. She exhibits distension. She exhibits no mass. There is no tenderness. There is no rebound and no guarding.  Musculoskeletal: Normal range of motion.  Neurological: She is alert and oriented to person, place, and time.  Skin: Skin is warm and dry.  Psychiatric: She has a normal mood and affect. Her behavior is normal. Judgment and thought content normal.   Assessment/Plan: 1) Nodular cirrhotic liver on abdominal ultrasound-hyperbilrubinemia with ascites, mild thrombocytopenia, hypoalbuminemia, PT-1.7 and pedal edema-probably due to ETOH-negative viral serologies; autoimmune workup pending. Has had paracentesis yesterday when 4 liters were removed. Due for repeat paracentesis today. Low sodium diet advised along with complete abstinence from alcohol. Will get a CT scan of the abdomen and pelvis and make further recommendations as needed.   2) Cholelithiasis and gallbladder sludge on ultrasound and thickening of the gallbladder wall probably due to hypoalbuminemia.  3) PCM.    4) Bilateral pleural effusions on CXR.  MANN,JYOTHI 08/24/2015, 8:59 AM

## 2015-08-25 ENCOUNTER — Encounter (HOSPITAL_COMMUNITY): Payer: Self-pay | Admitting: Radiology

## 2015-08-25 ENCOUNTER — Inpatient Hospital Stay (HOSPITAL_COMMUNITY): Payer: Self-pay

## 2015-08-25 DIAGNOSIS — K7031 Alcoholic cirrhosis of liver with ascites: Principal | ICD-10-CM

## 2015-08-25 DIAGNOSIS — R188 Other ascites: Secondary | ICD-10-CM

## 2015-08-25 LAB — CBC
HCT: 30.6 % — ABNORMAL LOW (ref 36.0–46.0)
Hemoglobin: 10.1 g/dL — ABNORMAL LOW (ref 12.0–15.0)
MCH: 35.1 pg — ABNORMAL HIGH (ref 26.0–34.0)
MCHC: 33 g/dL (ref 30.0–36.0)
MCV: 106.3 fL — ABNORMAL HIGH (ref 78.0–100.0)
Platelets: 91 10*3/uL — ABNORMAL LOW (ref 150–400)
RBC: 2.88 MIL/uL — ABNORMAL LOW (ref 3.87–5.11)
RDW: 15.3 % (ref 11.5–15.5)
WBC: 6.7 10*3/uL (ref 4.0–10.5)

## 2015-08-25 LAB — COMPREHENSIVE METABOLIC PANEL
ALT: 30 U/L (ref 14–54)
AST: 100 U/L — ABNORMAL HIGH (ref 15–41)
Albumin: 2.4 g/dL — ABNORMAL LOW (ref 3.5–5.0)
Alkaline Phosphatase: 75 U/L (ref 38–126)
Anion gap: 11 (ref 5–15)
BUN: 7 mg/dL (ref 6–20)
CO2: 19 mmol/L — ABNORMAL LOW (ref 22–32)
Calcium: 8.3 mg/dL — ABNORMAL LOW (ref 8.9–10.3)
Chloride: 106 mmol/L (ref 101–111)
Creatinine, Ser: 0.55 mg/dL (ref 0.44–1.00)
GFR calc Af Amer: >60 mL/min (ref >60)
GFR calc non Af Amer: >60 mL/min (ref >60)
Glucose, Bld: 86 mg/dL (ref 65–99)
Potassium: 3.2 mmol/L — ABNORMAL LOW (ref 3.5–5.1)
Sodium: 136 mmol/L (ref 135–145)
Total Bilirubin: 5.4 mg/dL — ABNORMAL HIGH (ref 0.3–1.2)
Total Protein: 6.7 g/dL (ref 6.5–8.1)

## 2015-08-25 LAB — HEPATITIS PANEL, ACUTE
HCV Ab: <0.1 s/co ratio (ref 0.0–0.9)
Hep A IgM: NEGATIVE
Hep B C IgM: NEGATIVE
Hepatitis B Surface Ag: NEGATIVE

## 2015-08-25 MED ORDER — SPIRONOLACTONE 25 MG PO TABS
25.0000 mg | ORAL_TABLET | Freq: Every day | ORAL | Status: DC
Start: 2015-08-25 — End: 2015-08-26
  Administered 2015-08-25 – 2015-08-26 (×2): 25 mg via ORAL
  Filled 2015-08-25 (×2): qty 1

## 2015-08-25 MED ORDER — IOPAMIDOL (ISOVUE-300) INJECTION 61%
INTRAVENOUS | Status: AC
  Administered 2015-08-25: 100 mL
  Filled 2015-08-25: qty 100

## 2015-08-25 MED ORDER — PIPERACILLIN-TAZOBACTAM 3.375 G IVPB
3.3750 g | Freq: Three times a day (TID) | INTRAVENOUS | Status: DC
  Administered 2015-08-25 – 2015-08-26 (×4): 3.375 g via INTRAVENOUS
  Filled 2015-08-25 (×6): qty 50

## 2015-08-25 MED ORDER — GADOXETATE DISODIUM 0.25 MMOL/ML IV SOLN
5.0000 mL | Freq: Once | INTRAVENOUS | Status: AC | PRN
  Administered 2015-08-25: 5 mL via INTRAVENOUS

## 2015-08-25 MED ORDER — POTASSIUM CHLORIDE CRYS ER 20 MEQ PO TBCR
40.0000 meq | EXTENDED_RELEASE_TABLET | Freq: Once | ORAL | Status: AC
  Administered 2015-08-25: 40 meq via ORAL
  Filled 2015-08-25: qty 2

## 2015-08-25 MED ORDER — FUROSEMIDE 20 MG PO TABS
20.0000 mg | ORAL_TABLET | Freq: Every day | ORAL | Status: DC
  Administered 2015-08-25 – 2015-08-26 (×2): 20 mg via ORAL
  Filled 2015-08-25 (×2): qty 1

## 2015-08-25 MED ORDER — ENSURE ENLIVE PO LIQD
237.0000 mL | Freq: Two times a day (BID) | ORAL | Status: DC
  Administered 2015-08-26: 237 mL via ORAL

## 2015-08-25 NOTE — Progress Notes (Signed)
During the night, the patient has had significant drainage from the site of her LLQ paracentesis site.  It is draining light clear yellow fluid.  The fluid has soaked 2 abdominal pads during night; as well as her night gown and bed pad.  She is not complaining of any pain.  Will continue to monitor patient.  [employer] MinorKimberly Gengler RN-BC, WTA.

## 2015-08-25 NOTE — Progress Notes (Signed)
Pharmacy Antibiotic Note  Michelle Schneider is a 55 y.o. female admitted on 08/23/2015 with pneumonia.  Pharmacy has been consulted for zosyn dosing.  Plan: Zosyn 3.375g IV q8h (4 hour infusion).  Monitor C&S, CBC and renal function  Height: 5\' 3"  (160 cm) Weight: 109 lb 12.8 oz (49.805 kg) IBW/kg (Calculated) : 52.4  Temp (24hrs), Avg:98.7 F (37.1 C), Min:98.4 F (36.9 C), Max:98.9 F (37.2 C)   Recent Labs Lab 08/23/15 1641 08/24/15 0518 08/25/15 0448  WBC 7.2 5.7 6.7  CREATININE 0.37* 0.37* 0.55    Estimated Creatinine Clearance: 61 mL/min (by C-G formula based on Cr of 0.55).    Allergies  Allergen Reactions  . Oxycodone-Acetaminophen Nausea And Vomiting and Other (See Comments)    Hallucinations and bad dreams    Antimicrobials this admission: Zosyn 6/11 >>   Microbiology results: 6/10 Peritoneal Fluid - ngtd  Thank you for allowing pharmacy to be a part of this patient's care.  Casilda Carlsaylor Stone, PharmD. PGY-1 Pharmacy Resident Pager: [phone] 08/25/2015 1:04 PM

## 2015-08-25 NOTE — Progress Notes (Addendum)
Cross cover for [facility] Subjective: Since I last evaluated the patient, she seems to be doing somewhat better. She claims the shortness of breath she was having a admission has improved significantly. However through the night she has had leakage of ascitic fluid from the site where it was tapped this has required soaking with abdominal pads she denies any fever chills or rigors there is no history of nausea vomiting. A CT scan done revealed multiple low-density lesions in the liver with no definite dominant mass. There is a question of a right lower lobe pneumonia; Dr. Ruffin Frederick in radiology is quite convinced that this is not basilar atelectasis.  Objective: Vital signs in last 24 hours: Temp:  [98.4 F (36.9 C)-98.9 F (37.2 C)] 98.5 F (36.9 C) (06/11 0600) Pulse Rate:  [102-115] 102 (06/11 0600) Resp:  [16-18] 18 (06/10 2054) BP: (121-133)/(66-76) 133/76 mmHg (06/11 0600) SpO2:  [94 %-95 %] 95 % (06/11 0600) Weight:  [49.805 kg (109 lb 12.8 oz)] 49.805 kg (109 lb 12.8 oz) (06/10 2054) Last BM Date: 08/24/15  Intake/Output from previous day: 06/10 0701 - 06/11 0700 In: 975 [P.O.:975] Out: 850 [Urine:850] Intake/Output this shift:   General appearance: alert, jaundiced, cooperative, appears stated age, no distress and pale Resp: clear to auscultation bilaterally Cardio: regular rate and rhythm, S1, S2 normal, no murmur, click, rub or gallop GI: soft, slightly distended, non-tender; bowel sounds normal; no masses,  no organomegaly Extremities: extremities normal, atraumatic, no cyanosis or edema  Lab Results:  Recent Labs  08/23/15 1641 08/24/15 0518 08/25/15 0448  WBC 7.2 5.7 6.7  HGB 11.5* 9.7* 10.1*  HCT 33.6* 29.7* 30.6*  PLT 133* 92* 91*   BMET  Recent Labs  08/23/15 1641 08/24/15 0518 08/25/15 0448  NA 135 138 136  K 3.2* 3.6 3.2*  CL 104 108 106  CO2 20* 20* 19*  GLUCOSE 97 68 86  BUN 8 5* 7  CREATININE 0.37* 0.37* 0.55  CALCIUM 8.1* 8.1* 8.3*    LFT  Recent Labs  08/24/15 0518 08/25/15 0448  PROT 6.6 6.7  ALBUMIN 2.2* 2.4*  AST 125* 100*  ALT 37 30  ALKPHOS 85 75  BILITOT 4.1* 5.4*  BILIDIR 1.7*  --    PT/INR  Recent Labs  08/23/15 1641 08/24/15 0518  LABPROT 17.5* 19.4*  INR 1.43 1.63*   Hepatitis Panel  Recent Labs  08/24/15 0518  HEPBSAG Negative  HCVAB <0.1  HEPAIGM Negative  HEPBIGM Negative   Studies/Results: [place] Chest  08/24/2015  CLINICAL DATA:  Ultrasound of the chest prior to possible thoracentesis. Status post paracentesis yesterday. EXAM: CHEST ULTRASOUND COMPARISON:  Chest X-ray done 08/23/2015. FINDINGS: Limited ultrasound survey for pleural fluid was performed of the right chest. There is a minimal amount of pleural fluid. There is no significant pocket of fluid to allow for a safe approach for thoracentesis. IMPRESSION: Minimal pleural effusion. Read by:  Corrin Parker, PA-C Electronically Signed   By: Malachy Moan M.D.   On: 08/24/2015 10:51   US Abdomen Complete  08/23/2015  CLINICAL DATA:  Abdominal pain and swelling for 2 weeks. Elevated bilirubin and AST level. EXAM: ABDOMEN ULTRASOUND COMPLETE COMPARISON:  08/23/2015 radiographs FINDINGS: Gallbladder: Gallbladder wall thickening, 6 mm in thickness. Shadowing small calculi in the gallbladder along with sludge. Sonographic Murphy's sign absent. Common bile duct: Diameter: 4 mm Liver: No focal lesion identified. Nodular margin, suspicious for cirrhosis. IVC: No abnormality visualized. Pancreas: Not well seen except a small portion of the pancreatic body  due to overlying bowel gas. Spleen: No splenomegaly.  Spleen measures 8.6 cm vertically. Right Kidney: Length: 11.3 cm. Echogenicity within normal limits. No mass or hydronephrosis visualized. Left Kidney: Length: 11.2 cm. Echogenicity within normal limits. No mass or hydronephrosis visualized. Abdominal aorta: No aneurysm visualized. Other findings: Large volume ascites.  Bilateral pleural  effusions. IMPRESSION: 1. Shrunken nodular liver favoring cirrhosis.  No splenomegaly seen. 2. Ascites and bilateral pleural effusions. 3. Cholelithiasis also with sludge in the gallbladder. Prominent gallbladder wall thickening, although this could be due to the patient's hypoalbuminemia rather than necessarily being from inflammation. 4. Ascites and pleural effusions. 5. Poor visualization of the pancreas. Electronically Signed   By: Gaylyn Rong M.D.   On: 08/23/2015 18:41   Ct Abdomen Pelvis W Contrast  08/25/2015  CLINICAL DATA:  Acute onset of elevated LFTs and ascites. Abdominal distention. Initial encounter. EXAM: CT ABDOMEN AND PELVIS WITH CONTRAST TECHNIQUE: Multidetector CT imaging of the abdomen and pelvis was performed using the standard protocol following bolus administration of intravenous contrast. CONTRAST:  ISOVUE-300 IOPAMIDOL (ISOVUE-300) INJECTION 61% COMPARISON:  None. FINDINGS: Small bilateral pleural effusions are noted. Diffuse right-sided airspace opacification raises suspicion for pneumonia. Mild left basilar atelectasis is noted. Small volume ascites is noted tracking about the liver and spleen, extending along the paracolic gutters and into the pelvis. The nodular contour of the liver is compatible with hepatic cirrhosis. Scattered foci of decreased attenuation are seen within the liver, without a definite dominant mass, though small masses cannot be excluded. The spleen is grossly unremarkable in appearance. Stones are noted within the gallbladder. The gallbladder is difficult to fully assess due to surrounding ascites. The pancreas and adrenal glands are unremarkable. The kidneys are unremarkable in appearance. There is no evidence of hydronephrosis. No renal or ureteral stones are seen. No perinephric stranding is appreciated. The small bowel is unremarkable in appearance. The stomach is within normal limits. No acute vascular abnormalities are seen. The appendix is  normal in caliber, without evidence of appendicitis. The colon is grossly unremarkable in appearance. Contrast progresses to the level of the rectum. Soft tissue edema is noted along the abdominal and pelvic wall, including at both flanks. Would correlate for any evidence of mild anasarca. The bladder is mildly distended and grossly unremarkable. The patient is status post hysterectomy. No suspicious adnexal masses are seen. No inguinal lymphadenopathy is seen. No acute osseous abnormalities are identified. IMPRESSION: 1. Diffuse right basilar airspace opacification raises suspicion for pneumonia. 2. Small bilateral pleural effusions noted. Mild left basilar atelectasis seen. 3. Small volume ascites within the abdomen and pelvis. 4. Findings of hepatic cirrhosis. Scattered foci of decreased attenuation within the liver, without a definite dominant mass, though small masses cannot be excluded. 5. Cholelithiasis. Gallbladder difficult to fully assess due to surrounding ascites. 6. Soft tissue edema along the abdominal and pelvic wall, including at both flanks. Would correlate for any evidence of mild anasarca. Electronically Signed   By: Roanna Raider M.D.   On: 08/25/2015 00:56   Dg Abd Acute W/chest  08/23/2015  CLINICAL DATA:  55 year old female with a history of abdominal distention and edema. EXAM: DG ABDOMEN ACUTE W/ 1V CHEST COMPARISON:  None. FINDINGS: Chest: Cardiomediastinal silhouette partially obscured by overlying lung and pleural disease. Dense opacity at the right base obscuring the right hemidiaphragm and the right heart border. Opacity at the left base in the retrocardiac region blunting the left costophrenic angle an the left cardiophrenic angle. No pneumothorax. Calcifications of  the aortic arch. Surgical changes of the right humerus for fixation of humeral fracture. Abdomen: Gas within stomach, small bowel, colon. No abnormally distended small bowel or colon. Soft tissue density at the periphery  of the abdomen, with bowel gas centralized. No unexpected calcifications. No unexpected radiopaque foreign body. No displaced fracture. Mild degenerative changes of the lower lumbar spine. IMPRESSION: Chest: Bilateral right greater than left pleural effusions with associated atelectasis/consolidation. Abdomen: Nonobstructive bowel gas pattern. Soft tissue density at the periphery of the abdomen with centralized small bowel loops, potentially representing ascites. This may be better assessed with a dedicated ultrasound, or, if there is concern for acute intra-abdominal process, cross-sectional imaging with CT may be considered. Signed, Yvone NeuJaime S. Loreta AveWagner, DO [facility] Electronically Signed   By: Gilmer MorJaime  Wagner D.O.   On: 08/23/2015 17:15   Medications: I have reviewed the patient's current medications.  ASSESSMENT & PLAN 1) Cirrhosis of the liver with ascites [SAAG >1.1] and pleural effusions, hyperbilirubinemia, thrombocytopenia, coagulopathy, mild anasarca-Multiple low density lesions in the liver-patent PV-she will need to have an MRI and a AFP level done. Start patient on a 2 gms low sodium diet along with Lasix/Aldactone at 20/25 and dose as tolerated as she has been hypokalemic. Etiology of cirrhosis is still unclear. Work up in progress.    2) Asymptomatic cholelithiasis.  3) Anemia of chronic disease.  4) PCM-albumin 2.4.  5) Right LL pnemonia-will need BSA-discussed this with Dr. Randol KernElgergawy.      LOS: 1 day   MANN,JYOTHI 08/25/2015, 9:34 AM

## 2015-08-25 NOTE — Progress Notes (Signed)
PROGRESS NOTE                                                                                                                                                                                                             Patient Demographics:    Michelle Schneider, is a 55 y.o. female, DOB - 07/06/1960, MVH:[id number]  Admit date - 08/23/2015   Admitting Physician Clydie Braunondell A Smith, MD  Outpatient Primary MD for the patient is No PCP Per Patient  LOS - 1  Chief Complaint  Patient presents with  . Leg Swelling       Brief Narrative   55 year old female with history of  alcohol abuse, presents with complaints of ascites, worsening lower extremity edema, status post paracentesis of 4 L in ED yesterday, Workup significant for elevated LFTs felt to be most likely alcoholic cirrhosis, CT abdomen pelvis significant for liver cirrhosis and scattered low-attenuation foci in the liver.   Subjective:    Michelle Schneider has, No headache, No chest pain, No abdominal pain - No Nausea, Report generalized weakness, dyspnea significantly improved after paracentesis.  Assessment  & Plan :    Active Problems:   Ascites   Hypoxia   Pleural effusion   Elevated bilirubin   Liver cirrhosis (HCC)   Elevated INR   Anemia   Cirrhosis of liver with ascites (HCC)  Ascites in the setting of new diagnosis of liver cirrhosis - Status post 4 L of ascites fluid removal - Ultrasound with evidence of liver cirrhosis, unclear etiology, most likely related to alcohol abuse - Negative hepatitis panel - CT abdomen pelvis significant for liver cirrhosis with low-attenuation foci in the liver, will check MRI of the liver, AFB to rule out liver malignancy. - Started on low dose Lasix and Aldactone   Pneumonia - CT significant for right lower lobe opacity, will be started on Zosyn, as well encouraged with incentive spirometry   thrombocytopenia -  in the setting of alcohol  consumption and liver disease - SCD for DVT prophylaxis  Coagulopathy - In the setting of liver disease  Alcohol abbuse - On CIWA  PCM - We'll start on ensure  Code Status : Full  Family Communication  : None at bedside  Disposition Plan  : Home when stable  Consults  :  GI  Procedures  :  Paracentesis  DVT Prophylaxis  :   SCDs  Lab Results  Component Value Date   PLT 91* 08/25/2015    Antibiotics  :    Anti-infectives    None        Objective:   Filed Vitals:   08/24/15 1859 08/24/15 2054 08/25/15 0600 08/25/15 1025  BP: 121/66 128/74 133/76 136/74  Pulse: 114 110 102 97  Temp: 98.9 F (37.2 C) 98.4 F (36.9 C) 98.5 F (36.9 C) 98.8 F (37.1 C)  TempSrc: Oral Oral Oral Oral  Resp: 16 18  18   Height:      Weight:  49.805 kg (109 lb 12.8 oz)    SpO2: 94% 95% 95% 100%    Wt Readings from Last 3 Encounters:  08/24/15 49.805 kg (109 lb 12.8 oz)  05/23/15 44.453 kg (98 lb)  05/22/15 44.589 kg (98 lb 4.8 oz)     Intake/Output Summary (Last 24 hours) at 08/25/15 1255 Last data filed at 08/25/15 0900  Gross per 24 hour  Intake    975 ml  Output    650 ml  Net    325 ml     Physical Exam  Awake Alert, Oriented X 3, Supple Neck,No JVD,  Symmetrical Chest wall movement, Good air movement bilaterally, no wheezing  RRR,No Gallops,Rubs or new Murmurs, No Parasternal Heave +ve B.Sounds, Abd Soft, No tenderness,No rebound - guarding or rigidity, small leakage at site of paracentesis. No Cyanosis, Clubbing , no edema (resolved),  erythema in bilateral significantly subsided   Data Review:    CBC  Recent Labs Lab 08/23/15 1641 08/24/15 0518 08/25/15 0448  WBC 7.2 5.7 6.7  HGB 11.5* 9.7* 10.1*  HCT 33.6* 29.7* 30.6*  PLT 133* 92* 91*  MCV 106.3* 105.7* 106.3*  MCH 36.4* 34.5* 35.1*  MCHC 34.2 32.7 33.0  RDW 16.6* 15.9* 15.3  LYMPHSABS 2.7  --   --   MONOABS 0.8  --   --   EOSABS 0.2  --   --   BASOSABS 0.1  --   --     Chemistries    Recent Labs Lab 08/23/15 1641 08/24/15 0518 08/25/15 0448  NA 135 138 136  K 3.2* 3.6 3.2*  CL 104 108 106  CO2 20* 20* 19*  GLUCOSE 97 68 86  BUN 8 5* 7  CREATININE 0.37* 0.37* 0.55  CALCIUM 8.1* 8.1* 8.3*  AST 171* 125* 100*  ALT 46 37 30  ALKPHOS 112 85 75  BILITOT 3.9* 4.1* 5.4*   ------------------------------------------------------------------------------------------------------------------ No results for input(s): CHOL, HDL, LDLCALC, TRIG, CHOLHDL, LDLDIRECT in the last 72 hours.  No results found for: HGBA1C ------------------------------------------------------------------------------------------------------------------ No results for input(s): TSH, T4TOTAL, T3FREE, THYROIDAB in the last 72 hours.  Invalid input(s): FREET3 ------------------------------------------------------------------------------------------------------------------  Recent Labs  08/24/15 1548  FERRITIN 383*    Coagulation profile  Recent Labs Lab 08/23/15 1641 08/24/15 0518  INR 1.43 1.63*    No results for input(s): DDIMER in the last 72 hours.  Cardiac Enzymes  Recent Labs Lab 08/23/15 1641  TROPONINI 0.03   ------------------------------------------------------------------------------------------------------------------    Component Value Date/Time   BNP 82.9 08/23/2015 1651    Inpatient Medications  Scheduled Meds: . folic acid  1 mg Oral Daily  . furosemide  20 mg Oral Daily  . lidocaine (PF)  5 mL Infiltration Once  . LORazepam  0-4 mg Intravenous Q6H   Followed by  . [START ON 08/26/2015] LORazepam  0-4 mg Intravenous Q12H  . multivitamin  with minerals  1 tablet Oral Daily  . sodium chloride flush  3 mL Intravenous Q12H  . spironolactone  25 mg Oral Daily  . thiamine  100 mg Oral Daily   Or  . thiamine  100 mg Intravenous Daily   Continuous Infusions:  PRN Meds:.LORazepam **OR** LORazepam, polyethylene glycol, traZODone  Micro Results Recent Results  (from the past 240 hour(s))  Culture, body fluid-bottle     Status: None (Preliminary result)   Collection Time: 08/24/15  1:15 AM  Result Value Ref Range Status   Specimen Description PERITONEAL  Final   Special Requests NONE  Final   Culture NO GROWTH 1 DAY  Final   Report Status PENDING  Incomplete  Gram stain     Status: None   Collection Time: 08/24/15  1:15 AM  Result Value Ref Range Status   Specimen Description PERITONEAL  Final   Special Requests NONE  Final   Gram Stain   Final    CYTOSPIN SMEAR WBC PRESENT, PREDOMINANTLY MONONUCLEAR NO ORGANISMS SEEN    Report Status 08/24/2015 FINAL  Final    Radiology Reports [place] Chest  08/24/2015  CLINICAL DATA:  Ultrasound of the chest prior to possible thoracentesis. Status post paracentesis yesterday. EXAM: CHEST ULTRASOUND COMPARISON:  Chest X-ray done 08/23/2015. FINDINGS: Limited ultrasound survey for pleural fluid was performed of the right chest. There is a minimal amount of pleural fluid. There is no significant pocket of fluid to allow for a safe approach for thoracentesis. IMPRESSION: Minimal pleural effusion. Read by:  Corrin Parker, PA-C Electronically Signed   By: Malachy Moan M.D.   On: 08/24/2015 10:51   US Abdomen Complete  08/23/2015  CLINICAL DATA:  Abdominal pain and swelling for 2 weeks. Elevated bilirubin and AST level. EXAM: ABDOMEN ULTRASOUND COMPLETE COMPARISON:  08/23/2015 radiographs FINDINGS: Gallbladder: Gallbladder wall thickening, 6 mm in thickness. Shadowing small calculi in the gallbladder along with sludge. Sonographic Murphy's sign absent. Common bile duct: Diameter: 4 mm Liver: No focal lesion identified. Nodular margin, suspicious for cirrhosis. IVC: No abnormality visualized. Pancreas: Not well seen except a small portion of the pancreatic body due to overlying bowel gas. Spleen: No splenomegaly.  Spleen measures 8.6 cm vertically. Right Kidney: Length: 11.3 cm. Echogenicity within normal limits. No mass or  hydronephrosis visualized. Left Kidney: Length: 11.2 cm. Echogenicity within normal limits. No mass or hydronephrosis visualized. Abdominal aorta: No aneurysm visualized. Other findings: Large volume ascites.  Bilateral pleural effusions. IMPRESSION: 1. Shrunken nodular liver favoring cirrhosis.  No splenomegaly seen. 2. Ascites and bilateral pleural effusions. 3. Cholelithiasis also with sludge in the gallbladder. Prominent gallbladder wall thickening, although this could be due to the patient's hypoalbuminemia rather than necessarily being from inflammation. 4. Ascites and pleural effusions. 5. Poor visualization of the pancreas. Electronically Signed   By: Gaylyn Rong M.D.   On: 08/23/2015 18:41   Ct Abdomen Pelvis W Contrast  08/25/2015  CLINICAL DATA:  Acute onset of elevated LFTs and ascites. Abdominal distention. Initial encounter. EXAM: CT ABDOMEN AND PELVIS WITH CONTRAST TECHNIQUE: Multidetector CT imaging of the abdomen and pelvis was performed using the standard protocol following bolus administration of intravenous contrast. CONTRAST:  ISOVUE-300 IOPAMIDOL (ISOVUE-300) INJECTION 61% COMPARISON:  None. FINDINGS: Small bilateral pleural effusions are noted. Diffuse right-sided airspace opacification raises suspicion for pneumonia. Mild left basilar atelectasis is noted. Small volume ascites is noted tracking about the liver and spleen, extending along the paracolic gutters and into the pelvis. The nodular contour  of the liver is compatible with hepatic cirrhosis. Scattered foci of decreased attenuation are seen within the liver, without a definite dominant mass, though small masses cannot be excluded. The spleen is grossly unremarkable in appearance. Stones are noted within the gallbladder. The gallbladder is difficult to fully assess due to surrounding ascites. The pancreas and adrenal glands are unremarkable. The kidneys are unremarkable in appearance. There is no evidence of  hydronephrosis. No renal or ureteral stones are seen. No perinephric stranding is appreciated. The small bowel is unremarkable in appearance. The stomach is within normal limits. No acute vascular abnormalities are seen. The appendix is normal in caliber, without evidence of appendicitis. The colon is grossly unremarkable in appearance. Contrast progresses to the level of the rectum. Soft tissue edema is noted along the abdominal and pelvic wall, including at both flanks. Would correlate for any evidence of mild anasarca. The bladder is mildly distended and grossly unremarkable. The patient is status post hysterectomy. No suspicious adnexal masses are seen. No inguinal lymphadenopathy is seen. No acute osseous abnormalities are identified. IMPRESSION: 1. Diffuse right basilar airspace opacification raises suspicion for pneumonia. 2. Small bilateral pleural effusions noted. Mild left basilar atelectasis seen. 3. Small volume ascites within the abdomen and pelvis. 4. Findings of hepatic cirrhosis. Scattered foci of decreased attenuation within the liver, without a definite dominant mass, though small masses cannot be excluded. 5. Cholelithiasis. Gallbladder difficult to fully assess due to surrounding ascites. 6. Soft tissue edema along the abdominal and pelvic wall, including at both flanks. Would correlate for any evidence of mild anasarca. Electronically Signed   By: Roanna Raider M.D.   On: 08/25/2015 00:56   Dg Abd Acute W/chest  08/23/2015  CLINICAL DATA:  55 year old female with a history of abdominal distention and edema. EXAM: DG ABDOMEN ACUTE W/ 1V CHEST COMPARISON:  None. FINDINGS: Chest: Cardiomediastinal silhouette partially obscured by overlying lung and pleural disease. Dense opacity at the right base obscuring the right hemidiaphragm and the right heart border. Opacity at the left base in the retrocardiac region blunting the left costophrenic angle an the left cardiophrenic angle. No pneumothorax.  Calcifications of the aortic arch. Surgical changes of the right humerus for fixation of humeral fracture. Abdomen: Gas within stomach, small bowel, colon. No abnormally distended small bowel or colon. Soft tissue density at the periphery of the abdomen, with bowel gas centralized. No unexpected calcifications. No unexpected radiopaque foreign body. No displaced fracture. Mild degenerative changes of the lower lumbar spine. IMPRESSION: Chest: Bilateral right greater than left pleural effusions with associated atelectasis/consolidation. Abdomen: Nonobstructive bowel gas pattern. Soft tissue density at the periphery of the abdomen with centralized small bowel loops, potentially representing ascites. This may be better assessed with a dedicated ultrasound, or, if there is concern for acute intra-abdominal process, cross-sectional imaging with CT may be considered. Signed, Yvone Neu. Loreta Ave, DO [facility] Electronically Signed   By: Gilmer Mor D.O.   On: 08/23/2015 17:15     ELGERGAWY, DAWOOD M.D on 08/25/2015 at 12:55 PM  Between 7am to 7pm - Pager - [phone]  After 7pm go to [email or web address] - password [facility]  [facility] -  Office  [phone]

## 2015-08-26 LAB — ANTI-SMOOTH MUSCLE ANTIBODY, IGG: F-Actin IgG: 53 Units — ABNORMAL HIGH (ref 0–19)

## 2015-08-26 LAB — CBC
HCT: 31.4 % — ABNORMAL LOW (ref 36.0–46.0)
Hemoglobin: 10.3 g/dL — ABNORMAL LOW (ref 12.0–15.0)
MCH: 34.9 pg — ABNORMAL HIGH (ref 26.0–34.0)
MCHC: 32.8 g/dL (ref 30.0–36.0)
MCV: 106.4 fL — ABNORMAL HIGH (ref 78.0–100.0)
Platelets: 85 10*3/uL — ABNORMAL LOW (ref 150–400)
RBC: 2.95 MIL/uL — ABNORMAL LOW (ref 3.87–5.11)
RDW: 14.6 % (ref 11.5–15.5)
WBC: 6.5 10*3/uL (ref 4.0–10.5)

## 2015-08-26 LAB — HEPATIC FUNCTION PANEL
ALT: 30 U/L (ref 14–54)
AST: 89 U/L — ABNORMAL HIGH (ref 15–41)
Albumin: 2.1 g/dL — ABNORMAL LOW (ref 3.5–5.0)
Alkaline Phosphatase: 75 U/L (ref 38–126)
Bilirubin, Direct: 1.9 mg/dL — ABNORMAL HIGH (ref 0.1–0.5)
Indirect Bilirubin: 2.6 mg/dL — ABNORMAL HIGH (ref 0.3–0.9)
Total Bilirubin: 4.5 mg/dL — ABNORMAL HIGH (ref 0.3–1.2)
Total Protein: 6.6 g/dL (ref 6.5–8.1)

## 2015-08-26 LAB — BASIC METABOLIC PANEL
Anion gap: 7 (ref 5–15)
BUN: <5 mg/dL — ABNORMAL LOW (ref 6–20)
CO2: 21 mmol/L — ABNORMAL LOW (ref 22–32)
Calcium: 8.1 mg/dL — ABNORMAL LOW (ref 8.9–10.3)
Chloride: 104 mmol/L (ref 101–111)
Creatinine, Ser: 0.36 mg/dL — ABNORMAL LOW (ref 0.44–1.00)
GFR calc Af Amer: >60 mL/min (ref >60)
GFR calc non Af Amer: >60 mL/min (ref >60)
Glucose, Bld: 95 mg/dL (ref 65–99)
Potassium: 3.2 mmol/L — ABNORMAL LOW (ref 3.5–5.1)
Sodium: 132 mmol/L — ABNORMAL LOW (ref 135–145)

## 2015-08-26 LAB — IRON AND TIBC
Iron: 69 ug/dL (ref 28–170)
Saturation Ratios: 42 % — ABNORMAL HIGH (ref 10.4–31.8)
TIBC: 162 ug/dL — ABNORMAL LOW (ref 250–450)
UIBC: 93 ug/dL

## 2015-08-26 LAB — PATHOLOGIST SMEAR REVIEW

## 2015-08-26 LAB — CERULOPLASMIN: Ceruloplasmin: 20.7 mg/dL (ref 19.0–39.0)

## 2015-08-26 LAB — AFP TUMOR MARKER: AFP-Tumor Marker: 13.5 ng/mL — ABNORMAL HIGH (ref 0.0–8.3)

## 2015-08-26 MED ORDER — SPIRONOLACTONE 50 MG PO TABS: 50.0000 mg | ORAL_TABLET | Freq: Every day | ORAL | Status: DC

## 2015-08-26 MED ORDER — ENSURE ENLIVE PO LIQD: 237.0000 mL | Freq: Two times a day (BID) | ORAL | Status: DC

## 2015-08-26 MED ORDER — ADULT MULTIVITAMIN W/MINERALS CH: 1.0000 | ORAL_TABLET | Freq: Every day | ORAL | Status: DC

## 2015-08-26 MED ORDER — AMOXICILLIN-POT CLAVULANATE 875-125 MG PO TABS: 1.0000 | ORAL_TABLET | Freq: Two times a day (BID) | ORAL | Status: DC

## 2015-08-26 MED ORDER — FOLIC ACID 1 MG PO TABS
1.0000 mg | ORAL_TABLET | Freq: Every day | ORAL | Status: DC
Start: 2015-08-26 — End: 2015-11-07

## 2015-08-26 MED ORDER — FUROSEMIDE 20 MG PO TABS: 20.0000 mg | ORAL_TABLET | Freq: Every day | ORAL | Status: DC

## 2015-08-26 MED ORDER — SPIRONOLACTONE 25 MG PO TABS
25.0000 mg | ORAL_TABLET | Freq: Once | ORAL | Status: AC
  Administered 2015-08-26: 25 mg via ORAL
  Filled 2015-08-26: qty 1

## 2015-08-26 MED ORDER — THIAMINE HCL 100 MG PO TABS: 100.0000 mg | ORAL_TABLET | Freq: Every day | ORAL | Status: DC

## 2015-08-26 MED ORDER — POTASSIUM CHLORIDE CRYS ER 20 MEQ PO TBCR
40.0000 meq | EXTENDED_RELEASE_TABLET | ORAL | Status: AC
  Administered 2015-08-26 (×2): 40 meq via ORAL
  Filled 2015-08-26 (×3): qty 2

## 2015-08-26 NOTE — Progress Notes (Signed)
Progress Note   Subjective  Feels much better than on admit- abdomen much less tense post large volume paracentesis Denies SOB, cough etc  MRI as below- no hepatic mass  AFP= 13.5 Hepatitis serology - negative Autoimmune markers -pending   Objective   Vital signs in last 24 hours: Temp:  [98.2 F (36.8 C)-98.7 F (37.1 C)] 98.3 F (36.8 C) (06/12 0811) Pulse Rate:  [82-99] 82 (06/12 0811) Resp:  [17-19] 18 (06/12 0811) BP: (117-156)/(70-85) 123/84 mmHg (06/12 0811) SpO2:  [95 %-97 %] 95 % (06/12 0811) Weight:  [106 lb 3.2 oz (48.172 kg)] 106 lb 3.2 oz (48.172 kg) (06/11 2054) Last BM Date: 08/25/15 General:  pleasant  white female in NAD, jaundiced  Heart:  Regular rate and rhythm; no murmurs Lungs: Respirations even and unlabored, lungs CTA bilaterally Abdomen:  Soft, nontender and nondistended. Normal bowel sounds., nontense ascites Extremities:  Without edema. Neurologic:  Alert and oriented,  grossly normal neurologically., no asterixis Psych:  Cooperative. Normal mood and affect.  Intake/Output from previous day: 06/11 0701 - 06/12 0700 In: 460 [P.O.:360; IV Piggyback:100] Out: 900 [Urine:900] Intake/Output this shift: Total I/O In: 120 [P.O.:120] Out: 800 [Urine:800]  Lab Results:  Recent Labs  08/24/15 0518 08/25/15 0448 08/26/15 0404  WBC 5.7 6.7 6.5  HGB 9.7* 10.1* 10.3*  HCT 29.7* 30.6* 31.4*  PLT 92* 91* 85*   BMET  Recent Labs  08/24/15 0518 08/25/15 0448 08/26/15 0404  NA 138 136 132*  K 3.6 3.2* 3.2*  CL 108 106 104  CO2 20* 19* 21*  GLUCOSE 68 86 95  BUN 5* 7 <5*  CREATININE 0.37* 0.55 0.36*  CALCIUM 8.1* 8.3* 8.1*   LFT  Recent Labs  08/26/15 0404  PROT 6.6  ALBUMIN 2.1*  AST 89*  ALT 30  ALKPHOS 75  BILITOT 4.5*  BILIDIR 1.9*  IBILI 2.6*   PT/INR  Recent Labs  08/23/15 1641 08/24/15 0518  LABPROT 17.5* 19.4*  INR 1.43 1.63*    Studies/Results: Ct Abdomen Pelvis W Contrast  08/25/2015  CLINICAL DATA:   Acute onset of elevated LFTs and ascites. Abdominal distention. Initial encounter. EXAM: CT ABDOMEN AND PELVIS WITH CONTRAST TECHNIQUE: Multidetector CT imaging of the abdomen and pelvis was performed using the standard protocol following bolus administration of intravenous contrast. CONTRAST:  100mL ISOVUE-300 IOPAMIDOL (ISOVUE-300) INJECTION 61% COMPARISON:  None. FINDINGS: Small bilateral pleural effusions are noted. Diffuse right-sided airspace opacification raises suspicion for pneumonia. Mild left basilar atelectasis is noted. Small volume ascites is noted tracking about the liver and spleen, extending along the paracolic gutters and into the pelvis. The nodular contour of the liver is compatible with hepatic cirrhosis. Scattered foci of decreased attenuation are seen within the liver, without a definite dominant mass, though small masses cannot be excluded. The spleen is grossly unremarkable in appearance. Stones are noted within the gallbladder. The gallbladder is difficult to fully assess due to surrounding ascites. The pancreas and adrenal glands are unremarkable. The kidneys are unremarkable in appearance. There is no evidence of hydronephrosis. No renal or ureteral stones are seen. No perinephric stranding is appreciated. The small bowel is unremarkable in appearance. The stomach is within normal limits. No acute vascular abnormalities are seen. The appendix is normal in caliber, without evidence of appendicitis. The colon is grossly unremarkable in appearance. Contrast progresses to the level of the rectum. Soft tissue edema is noted along the abdominal and pelvic wall, including at both flanks. Would correlate for any  evidence of mild anasarca. The bladder is mildly distended and grossly unremarkable. The patient is status post hysterectomy. No suspicious adnexal masses are seen. No inguinal lymphadenopathy is seen. No acute osseous abnormalities are identified. IMPRESSION: 1. Diffuse right basilar  airspace opacification raises suspicion for pneumonia. 2. Small bilateral pleural effusions noted. Mild left basilar atelectasis seen. 3. Small volume ascites within the abdomen and pelvis. 4. Findings of hepatic cirrhosis. Scattered foci of decreased attenuation within the liver, without a definite dominant mass, though small masses cannot be excluded. 5. Cholelithiasis. Gallbladder difficult to fully assess due to surrounding ascites. 6. Soft tissue edema along the abdominal and pelvic wall, including at both flanks. Would correlate for any evidence of mild anasarca. Electronically Signed   By: Roanna Raider M.D.   On: 08/25/2015 00:56   Mr Liver W Wo Contrast  08/26/2015  CLINICAL DATA:  55 year old female with evidence of cirrhosis on recent CT examination. Evaluate for underlying liver lesions. EXAM: MRI ABDOMEN WITHOUT AND WITH CONTRAST TECHNIQUE: Multiplanar multisequence MR imaging of the abdomen was performed both before and after the administration of intravenous contrast. CONTRAST:  5 mL of Eovist. COMPARISON:  No prior abdominal MRI. CT the abdomen and pelvis 08/24/2015. FINDINGS: Lower chest: Irregular areas of signal intensity within the lung bases bilaterally (right greater than left), predominantly in the right lower lobe, concerning for consolidation. Small bilateral pleural effusions lying dependently (right greater than left). Moderate-sized hiatal hernia. Hepatobiliary: The liver has a shrunken appearance and nodular contour, compatible with underlying cirrhosis. No discrete cystic or solid hepatic lesions are identified. Specifically, no hypervascular hepatic lesion noted to suggest underlying hepatocellular carcinoma at this time. No intra or extrahepatic biliary ductal dilatation. Dependent material in the gallbladder is T2 hypointense and T1 hyperintense, compatible with biliary sludge and/or tiny gallstones. Gallbladder does not appear distended. Pancreas: No pancreatic mass. No  pancreatic ductal dilatation. No pancreatic or peripancreatic organized fluid collections. Small amount of T2 signal intensity adjacent to the pancreas throughout the retroperitoneum, likely related to underlying anasarca. Spleen: Unremarkable. Adrenals/Urinary Tract: Kidneys and bilateral adrenal glands are normal in appearance. No hydroureteronephrosis in the visualized abdomen. Stomach/Bowel: Visualized portions are unremarkable. Vascular/Lymphatic: No aneurysm identified in the visualized abdominal vasculature. No lymphadenopathy noted in the abdomen. Other: Large volume of ascites. Diffuse soft tissue edema in the retroperitoneum and abdominal wall. Musculoskeletal: No aggressive osseous lesions are noted in the visualized portions of the skeleton. IMPRESSION: 1. Morphologic changes in the liver compatible with underlying cirrhosis. No hypervascular lesion identified to suggest underlying hepatocellular carcinoma at this time. 2. Large volume of ascites, retroperitoneal edema and diffuse body wall edema with bilateral pleural effusions, suggestive of a state of anasarca. 3. Patchy areas of increased signal intensity throughout the lung bases, concerning for consolidation from either infection or aspiration. Electronically Signed   By: Trudie Reed M.D.   On: 08/26/2015 07:40       Assessment / Plan:    #1 55 yo female with new dx of decompensated cirrhosis with ascites Etiology not clear - possibly ETOH- autoimmune markers pending Improved post paracentesis , on low dose diuretics Will need 2 gram sodium diet No ETOH ! Will need EGD to screen for varices- can be done outpt though (no insurance) Will plan to follow up in office in 2 weeks- repeat labs etc Appt arranged with Tammi Sou- 6/27 /17 at 10 am. She will be established with Dr Adela Lank   #2 possible pneumonia- per med service  She  is stable from GI standpoint to be discharged, but pt doesn't have transportation today, will  have in am    Active Problems:   Ascites   Hypoxia   Pleural effusion   Elevated bilirubin   Liver cirrhosis (HCC)   Elevated INR   Anemia   Cirrhosis of liver with ascites (HCC)     LOS: 2 days   Amy Esterwood  08/26/2015, 1:50 PM

## 2015-08-26 NOTE — Discharge Instructions (Signed)
Follow with [facility]  Get CBC, CMP, checked  by Primary MD next visit.    Activity: As tolerated with Full fall precautions use walker/cane & assistance as needed   Disposition Home    Diet: Heart Healthy , low sodium, with feeding assistance and aspiration precautions.  For Heart failure patients - Check your Weight same time everyday, if you gain over 2 pounds, or you develop in leg swelling, experience more shortness of breath or chest pain, call your Primary MD immediately. Follow Cardiac Low Salt Diet and 1.5 lit/day fluid restriction.   On your next visit with your primary care physician please Get Medicines reviewed and adjusted.   Please request your Prim.MD to go over all Hospital Tests and Procedure/Radiological results at the follow up, please get all Hospital records sent to your Prim MD by signing hospital release before you go home.   If you experience worsening of your admission symptoms, develop shortness of breath, life threatening emergency, suicidal or homicidal thoughts you must seek medical attention immediately by calling 911 or calling your MD immediately  if symptoms less severe.  You Must read complete instructions/literature along with all the possible adverse reactions/side effects for all the Medicines you take and that have been prescribed to you. Take any new Medicines after you have completely understood and accpet all the possible adverse reactions/side effects.   Do not drive, operating heavy machinery, perform activities at heights, swimming or participation in water activities or provide baby sitting services if your were admitted for syncope or siezures until you have seen by Primary MD or a Neurologist and advised to do so again.  Do not drive when taking Pain medications.    Do not take more than prescribed Pain, Sleep and Anxiety Medications  Special Instructions: If you have smoked or chewed Tobacco  in the last 2 yrs please stop  smoking, stop any regular Alcohol  and or any Recreational drug use.  Wear Seat belts while driving.   Please note  You were cared for by a hospitalist during your hospital stay. If you have any questions about your discharge medications or the care you received while you were in the hospital after you are discharged, you can call the unit and asked to speak with the hospitalist on call if the hospitalist that took care of you is not available. Once you are discharged, your primary care physician will handle any further medical issues. Please note that NO REFILLS for any discharge medications will be authorized once you are discharged, as it is imperative that you return to your primary care physician (or establish a relationship with a primary care physician if you do not have one) for your aftercare needs so that they can reassess your need for medications and monitor your lab values.

## 2015-08-26 NOTE — Discharge Summary (Signed)
Michelle Schneider, is a 55 y.o. female  DOB [birth year]  MRN [id number].  Admission date:  08/23/2015  Admitting Physician  Clydie Braun, MD  Discharge Date:  08/26/2015   Primary MD  No PCP Per Patient  Recommendations for primary care physician for things to follow:  - patient to follow with [facility] gastroenterology regarding autoimmune workup and cytology results for liver cirrhosis - Please repeat lab during next visit  Admission Diagnosis  Cirrhosis of liver with ascites, unspecified hepatic cirrhosis type (HCC) [K74.60]   Discharge Diagnosis  Cirrhosis of liver with ascites, unspecified hepatic cirrhosis type (HCC) [K74.60]    Active Problems:   Ascites   Hypoxia   Pleural effusion   Elevated bilirubin   Liver cirrhosis (HCC)   Elevated INR   Anemia   Cirrhosis of liver with ascites [facility])      Past Medical History  Diagnosis Date  . Family history of adverse reaction to anesthesia     cousin had a difficult time waking up after appendectomy  . Heart murmur     "diagnosed with a very slight heart murmur in 2001 but no other doctor has been able to hear it"  . Raynaud's disease     Past Surgical History  Procedure Laterality Date  . Abdominal hysterectomy    . Orif humerus fracture Right 05/23/2015    Procedure: OPEN REDUCTION INTERNAL FIXATION (ORIF) RIGHT  PROXIMAL HUMERUS FRACTURE;  Surgeon: Francena Hanly, MD;  Location: [facility] OR;  Service: Orthopedics;  Laterality: Right;       History of present illness and  Hospital Course:     Kindly see H&P for history of present illness and admission details, please review complete Labs, Consult reports and Test reports for all details in brief  HPI  from the history and physical done on the day of admission 08/24/2015  HPI: Michelle Schneider is a 55 y.o. female with medical history significant for raynaud's.patient presented to outside  emergency room complaining of abdominal distention and lower leg swelling. Patient states that this occurred over approximately 3 weeks.at that time her abdomen started to give out. Patient has no history of ascites. Patient began to develop lower extremity Swelling 2 days ago and it has been steadily worsening. Patient has no history of liver disease. Patient has no history of IV drug use. Patient denies using dirty tattoo parlors. Patient denies any past blood transfusions. Patient is unable to eat and drink normally. Patient denies any abdominal trauma.  She denies fevers chills nausea vomiting diarrhea.  ED Course: paracentesis performed in emergency room and 4 L taken off.   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  Ascites in the setting of new diagnosis of liver cirrhosis - Status post 4 L of ascites fluid removal - Ultrasound with evidence of liver cirrhosis, unclear etiology, most likely related to alcohol abuse (she reports she drinks 1 bottle of wine every 4-5 days) - Negative hepatitis panel - AFP 3.5 - CT abdomen pelvis significant for liver cirrhosis with low-attenuation foci in the liver, MRI liver with no evidence of hepatocellular carcinoma at this time, but dictated thickened for liver cirrhosis - we'll discharge on low dose Lasix,  Aldactone, and low sodium diet. - autoimmune workup pending at the time of discharge, he will follow with [facility] Dr.armbruster.   Pneumonia - CT significant for right lower lobe opacity, Treated with Zosyn during hospital  stay, will discharge on oral Augmentin.  thrombocytopenia - in the setting of alcohol consumption and liver disease   Coagulopathy - In the setting of liver disease  Alcohol abbuse - no evidence of withdrawals during hosp  PCM - We'll start on ensure    Discharge Condition:  stable   Follow UP  Follow-up Information    Follow up with Amy Esterwood, PA-C.   Specialty:  Gastroenterology   Why:  appointment on 6/27 at 10 AM   Contact information:   [address] [place] [address] [phone]       Follow up with [facility]. Schedule an appointment as soon as possible for a visit in 2 weeks.   Contact information:   [address] [place] [address] [phone]        Discharge Instructions  and  Discharge Medications        Discharge Instructions    Diet - low sodium heart healthy    Complete by:  As directed      Discharge instructions    Complete by:  As directed   [facility]  Get CBC, CMP, checked  by Primary MD next visit.    Activity: As tolerated with Full fall precautions use walker/cane & assistance as needed   Disposition Home    Diet: Heart Healthy , low sodium, with feeding assistance and aspiration precautions.  For Heart failure patients - Check your Weight same time everyday, if you gain over 2 pounds, or you develop in leg swelling, experience more shortness of breath or chest pain, call your Primary MD immediately. Follow Cardiac Low Salt Diet and 1.5 lit/day fluid restriction.   On your next visit with your primary care physician please Get Medicines reviewed and adjusted.   Please request your Prim.MD to go over all Hospital Tests and Procedure/Radiological results at the follow up, please get all Hospital records sent to your Prim MD by signing hospital release before you go home.   If you experience worsening of your admission symptoms, develop  shortness of breath, life threatening emergency, suicidal or homicidal thoughts you must seek medical attention immediately by calling 911 or calling your MD immediately  if symptoms less severe.  You Must read complete instructions/literature along with all the possible adverse reactions/side effects for all the Medicines you take and that have been prescribed to you. Take any new Medicines after you have completely understood and accpet all the possible adverse reactions/side effects.   Do not drive, operating heavy machinery, perform activities at heights, swimming or participation in water activities or provide baby sitting services if your were admitted for syncope or siezures  until you have seen by Primary MD or a Neurologist and advised to do so again.  Do not drive when taking Pain medications.    Do not take more than prescribed Pain, Sleep and Anxiety Medications  Special Instructions: If you have smoked or chewed Tobacco  in the last 2 yrs please stop smoking, stop any regular Alcohol  and or any Recreational drug use.  Wear Seat belts while driving.   Please note  You were cared for by a hospitalist during your hospital stay. If you have any questions about your discharge medications or the care you received while you were in the hospital after you are discharged, you can call the unit and asked to speak with the hospitalist on call if the hospitalist that took care of you is not available. Once you are discharged, your primary care physician will handle any further medical issues. Please note that NO REFILLS for any discharge medications will be authorized once you are discharged, as it is imperative that you return to your primary care physician (or establish a relationship with a primary care physician if you do not have one) for your aftercare needs so that they can reassess your need for medications and monitor your lab values.     Increase activity slowly    Complete by:  As  directed             Medication List    STOP taking these medications        diazepam 5 MG tablet  Commonly known as:  VALIUM     ibuprofen 200 MG tablet  Commonly known as:  ADVIL,MOTRIN     NON FORMULARY     ondansetron 4 MG tablet  Commonly known as:  ZOFRAN     OVER THE COUNTER MEDICATION     OVER THE COUNTER MEDICATION     oxyCODONE-acetaminophen 5-325 MG tablet  Commonly known as:  PERCOCET      TAKE these medications        amoxicillin-clavulanate 875-125 MG tablet  Commonly known as:  AUGMENTIN  Take 1 tablet by mouth 2 (two) times daily.     feeding supplement (ENSURE ENLIVE) Liqd  Take 237 mLs by mouth 2 (two) times daily between meals.     folic acid 1 MG tablet  Commonly known as:  FOLVITE  Take 1 tablet (1 mg total) by mouth daily.     furosemide 20 MG tablet  Commonly known as:  LASIX  Take 1 tablet (20 mg total) by mouth daily.     multivitamin with minerals Tabs tablet  Take 1 tablet by mouth daily.     spironolactone 50 MG tablet  Commonly known as:  ALDACTONE  Take 1 tablet (50 mg total) by mouth daily.  Start taking on:  08/27/2015     thiamine 100 MG tablet  Take 1 tablet (100 mg total) by mouth daily.          Diet and Activity recommendation: See Discharge Instructions above   Consults obtained -  GI   Major procedures and Radiology Reports - PLEASE review detailed and final reports for all details, in brief -     [place] Chest  08/24/2015  CLINICAL DATA:  Ultrasound of the chest prior to possible thoracentesis. Status post paracentesis yesterday. EXAM: CHEST ULTRASOUND COMPARISON:  Chest X-ray done 08/23/2015. FINDINGS: Limited ultrasound survey for pleural fluid was performed of the right chest. There is a minimal amount of pleural fluid. There  is no significant pocket of fluid to allow for a safe approach for thoracentesis. IMPRESSION: Minimal pleural effusion. Read by:  Corrin Parker, PA-C Electronically Signed   By: Malachy Moan M.D.   On: 08/24/2015 10:51   US Abdomen Complete  08/23/2015  CLINICAL DATA:  Abdominal pain and swelling for 2 weeks. Elevated bilirubin and AST level. EXAM: ABDOMEN ULTRASOUND COMPLETE COMPARISON:  08/23/2015 radiographs FINDINGS: Gallbladder: Gallbladder wall thickening, 6 mm in thickness. Shadowing small calculi in the gallbladder along with sludge. Sonographic Murphy's sign absent. Common bile duct: Diameter: 4 mm Liver: No focal lesion identified. Nodular margin, suspicious for cirrhosis. IVC: No abnormality visualized. Pancreas: Not well seen except a small portion of the pancreatic body due to overlying bowel gas. Spleen: No splenomegaly.  Spleen measures 8.6 cm vertically. Right Kidney: Length: 11.3 cm. Echogenicity within normal limits. No mass or hydronephrosis visualized. Left Kidney: Length: 11.2 cm. Echogenicity within normal limits. No mass or hydronephrosis visualized. Abdominal aorta: No aneurysm visualized. Other findings: Large volume ascites.  Bilateral pleural effusions. IMPRESSION: 1. Shrunken nodular liver favoring cirrhosis.  No splenomegaly seen. 2. Ascites and bilateral pleural effusions. 3. Cholelithiasis also with sludge in the gallbladder. Prominent gallbladder wall thickening, although this could be due to the patient's hypoalbuminemia rather than necessarily being from inflammation. 4. Ascites and pleural effusions. 5. Poor visualization of the pancreas. Electronically Signed   By: Gaylyn Rong M.D.   On: 08/23/2015 18:41   Ct Abdomen Pelvis W Contrast  08/25/2015  CLINICAL DATA:  Acute onset of elevated LFTs and ascites. Abdominal distention. Initial encounter. EXAM: CT ABDOMEN AND PELVIS WITH CONTRAST TECHNIQUE: Multidetector CT imaging of the abdomen and pelvis was performed using the standard protocol following bolus administration of intravenous contrast. CONTRAST:  ISOVUE-300 IOPAMIDOL (ISOVUE-300) INJECTION 61% COMPARISON:  None. FINDINGS: Small  bilateral pleural effusions are noted. Diffuse right-sided airspace opacification raises suspicion for pneumonia. Mild left basilar atelectasis is noted. Small volume ascites is noted tracking about the liver and spleen, extending along the paracolic gutters and into the pelvis. The nodular contour of the liver is compatible with hepatic cirrhosis. Scattered foci of decreased attenuation are seen within the liver, without a definite dominant mass, though small masses cannot be excluded. The spleen is grossly unremarkable in appearance. Stones are noted within the gallbladder. The gallbladder is difficult to fully assess due to surrounding ascites. The pancreas and adrenal glands are unremarkable. The kidneys are unremarkable in appearance. There is no evidence of hydronephrosis. No renal or ureteral stones are seen. No perinephric stranding is appreciated. The small bowel is unremarkable in appearance. The stomach is within normal limits. No acute vascular abnormalities are seen. The appendix is normal in caliber, without evidence of appendicitis. The colon is grossly unremarkable in appearance. Contrast progresses to the level of the rectum. Soft tissue edema is noted along the abdominal and pelvic wall, including at both flanks. Would correlate for any evidence of mild anasarca. The bladder is mildly distended and grossly unremarkable. The patient is status post hysterectomy. No suspicious adnexal masses are seen. No inguinal lymphadenopathy is seen. No acute osseous abnormalities are identified. IMPRESSION: 1. Diffuse right basilar airspace opacification raises suspicion for pneumonia. 2. Small bilateral pleural effusions noted. Mild left basilar atelectasis seen. 3. Small volume ascites within the abdomen and pelvis. 4. Findings of hepatic cirrhosis. Scattered foci of decreased attenuation within the liver, without a definite dominant mass, though small masses cannot be excluded. 5. Cholelithiasis. Gallbladder  difficult to  fully assess due to surrounding ascites. 6. Soft tissue edema along the abdominal and pelvic wall, including at both flanks. Would correlate for any evidence of mild anasarca. Electronically Signed   By: Roanna Raider M.D.   On: 08/25/2015 00:56   Mr Liver W Wo Contrast  08/26/2015  CLINICAL DATA:  55 year old female with evidence of cirrhosis on recent CT examination. Evaluate for underlying liver lesions. EXAM: MRI ABDOMEN WITHOUT AND WITH CONTRAST TECHNIQUE: Multiplanar multisequence MR imaging of the abdomen was performed both before and after the administration of intravenous contrast. CONTRAST:  5 mL of Eovist. COMPARISON:  No prior abdominal MRI. CT the abdomen and pelvis 08/24/2015. FINDINGS: Lower chest: Irregular areas of signal intensity within the lung bases bilaterally (right greater than left), predominantly in the right lower lobe, concerning for consolidation. Small bilateral pleural effusions lying dependently (right greater than left). Moderate-sized hiatal hernia. Hepatobiliary: The liver has a shrunken appearance and nodular contour, compatible with underlying cirrhosis. No discrete cystic or solid hepatic lesions are identified. Specifically, no hypervascular hepatic lesion noted to suggest underlying hepatocellular carcinoma at this time. No intra or extrahepatic biliary ductal dilatation. Dependent material in the gallbladder is T2 hypointense and T1 hyperintense, compatible with biliary sludge and/or tiny gallstones. Gallbladder does not appear distended. Pancreas: No pancreatic mass. No pancreatic ductal dilatation. No pancreatic or peripancreatic organized fluid collections. Small amount of T2 signal intensity adjacent to the pancreas throughout the retroperitoneum, likely related to underlying anasarca. Spleen: Unremarkable. Adrenals/Urinary Tract: Kidneys and bilateral adrenal glands are normal in appearance. No hydroureteronephrosis in the visualized abdomen.  Stomach/Bowel: Visualized portions are unremarkable. Vascular/Lymphatic: No aneurysm identified in the visualized abdominal vasculature. No lymphadenopathy noted in the abdomen. Other: Large volume of ascites. Diffuse soft tissue edema in the retroperitoneum and abdominal wall. Musculoskeletal: No aggressive osseous lesions are noted in the visualized portions of the skeleton. IMPRESSION: 1. Morphologic changes in the liver compatible with underlying cirrhosis. No hypervascular lesion identified to suggest underlying hepatocellular carcinoma at this time. 2. Large volume of ascites, retroperitoneal edema and diffuse body wall edema with bilateral pleural effusions, suggestive of a state of anasarca. 3. Patchy areas of increased signal intensity throughout the lung bases, concerning for consolidation from either infection or aspiration. Electronically Signed   By: Trudie Reed M.D.   On: 08/26/2015 07:40   Dg Abd Acute W/chest  08/23/2015  CLINICAL DATA:  55 year old female with a history of abdominal distention and edema. EXAM: DG ABDOMEN ACUTE W/ 1V CHEST COMPARISON:  None. FINDINGS: Chest: Cardiomediastinal silhouette partially obscured by overlying lung and pleural disease. Dense opacity at the right base obscuring the right hemidiaphragm and the right heart border. Opacity at the left base in the retrocardiac region blunting the left costophrenic angle an the left cardiophrenic angle. No pneumothorax. Calcifications of the aortic arch. Surgical changes of the right humerus for fixation of humeral fracture. Abdomen: Gas within stomach, small bowel, colon. No abnormally distended small bowel or colon. Soft tissue density at the periphery of the abdomen, with bowel gas centralized. No unexpected calcifications. No unexpected radiopaque foreign body. No displaced fracture. Mild degenerative changes of the lower lumbar spine. IMPRESSION: Chest: Bilateral right greater than left pleural effusions with associated  atelectasis/consolidation. Abdomen: Nonobstructive bowel gas pattern. Soft tissue density at the periphery of the abdomen with centralized small bowel loops, potentially representing ascites. This may be better assessed with a dedicated ultrasound, or, if there is concern for acute intra-abdominal process, cross-sectional imaging with CT may  be considered. Signed, Yvone Neu. Loreta Ave, DO [facility] Electronically Signed   By: Gilmer Mor D.O.   On: 08/23/2015 17:15    Micro Results    Recent Results (from the past 240 hour(s))  Culture, body fluid-bottle     Status: None (Preliminary result)   Collection Time: 08/24/15  1:15 AM  Result Value Ref Range Status   Specimen Description PERITONEAL  Final   Special Requests NONE  Final   Culture NO GROWTH 2 DAYS  Final   Report Status PENDING  Incomplete  Gram stain     Status: None   Collection Time: 08/24/15  1:15 AM  Result Value Ref Range Status   Specimen Description PERITONEAL  Final   Special Requests NONE  Final   Gram Stain   Final    CYTOSPIN SMEAR WBC PRESENT, PREDOMINANTLY MONONUCLEAR NO ORGANISMS SEEN    Report Status 08/24/2015 FINAL  Final       Today   Subjective:   Michelle Schneider today has no headache,no chest or abdominal pain,no new weakness tingling or numbness, feels much better wants to go home today.   Objective:   Blood pressure 123/84, pulse 82, temperature 98.3 F (36.8 C), temperature source Oral, resp. rate 18, height 5\' 3"  (1.6 m), weight 48.172 kg (106 lb 3.2 oz), SpO2 95 %.   Intake/Output Summary (Last 24 hours) at 08/26/15 1551 Last data filed at 08/26/15 1000  Gross per 24 hour  Intake    460 ml  Output   1700 ml  Net  -1240 ml    Exam Awake Alert, Oriented x 3, No new F.N deficits, Normal affect [place].AT,PERRAL Supple Neck,No JVD, No cervical lymphadenopathy appriciated.  Symmetrical Chest wall movement, Good air movement  bilaterally, CTAB RRR,No Gallops,Rubs or new Murmurs, No Parasternal Heave +ve B.Sounds, Abd Soft, Non tender,minimal non-tense ascites No Cyanosis, Clubbing or edema, No new Rash or bruise  Data Review   CBC w Diff:  Lab Results  Component Value Date   WBC 6.5 08/26/2015   HGB 10.3* 08/26/2015   HCT 31.4* 08/26/2015   PLT 85* 08/26/2015   LYMPHOPCT 37 08/23/2015   MONOPCT 10 08/23/2015   EOSPCT 3 08/23/2015   BASOPCT 2 08/23/2015    CMP:  Lab Results  Component Value Date   NA 132* 08/26/2015   K 3.2* 08/26/2015   CL 104 08/26/2015   CO2 21* 08/26/2015   BUN <5* 08/26/2015   CREATININE 0.36* 08/26/2015   PROT 6.6 08/26/2015   ALBUMIN 2.1* 08/26/2015   BILITOT 4.5* 08/26/2015   ALKPHOS 75 08/26/2015   AST 89* 08/26/2015   ALT 30 08/26/2015  .   Total Time in preparing paper work, data evaluation and todays exam - 35 minutes  ELGERGAWY, DAWOOD M.D on 08/26/2015 at 3:51 PM  [facility]   Office  [phone]

## 2015-08-26 NOTE — Evaluation (Signed)
Clinical/Bedside Swallow Evaluation Patient Details  Name: Michelle Schneider MRN: [id number] Date of Birth: 07/03/1960  Today's Date: 08/26/2015 Time: SLP Start Time (ACUTE ONLY): 1431 SLP Stop Time (ACUTE ONLY): 1446 SLP Time Calculation (min) (ACUTE ONLY): 15 min  Past Medical History:  Past Medical History  Diagnosis Date  . Family history of adverse reaction to anesthesia     cousin had a difficult time waking up after appendectomy  . Heart murmur     "diagnosed with a very slight heart murmur in 2001 but no other doctor has been able to hear it"  . Raynaud's disease    Past Surgical History:  Past Surgical History  Procedure Laterality Date  . Abdominal hysterectomy    . Orif humerus fracture Right 05/23/2015    Procedure: OPEN REDUCTION INTERNAL FIXATION (ORIF) RIGHT  PROXIMAL HUMERUS FRACTURE;  Surgeon: Francena HanlyKevin Supple, MD;  Location: [facility] OR;  Service: Orthopedics;  Laterality: Right;   HPI:  55 y.o. female with medical history significant for raynaud's.patient presented to outside emergency room complaining of abdominal distention and lower leg swelling. Patient states that this occurred over approximately 3 weeks.at that time her abdomen started to give out. Patient has no history of ascites. Patient began to develop lower extremity Swelling 2 days ago and it has been steadily worsening. Patient has no history of liver disease. Patient has no history of IV drug use. Patient denies using dirty tattoo parlors. Patient denies any past blood transfusions. Patient is unable to eat and drink normally. Patient denies any abdominal trauma; denies any dysphagia; BSE ordered to r/o aspiration d/t possible RLL PNA.   Assessment / Plan / Recommendation Clinical Impression   Pt with a normal oropharyngeal swallow noted during entire BSE; no overt s/s of aspiration noted throughout BSE; Regular/thin diet recommended; ST does not need to f/u at this time.    Aspiration Risk  No limitations     Diet Recommendation   Regular (2 gm sodium)/thin liquids  Medication Administration: Whole meds with liquid    Other  Recommendations Oral Care Recommendations: Oral care BID   Follow up Recommendations  None    Frequency and Duration   n/a         Prognosis Prognosis for Safe Diet Advancement: Good      Swallow Study   General Date of Onset: 08/23/15 HPI: 55 y.o. female with medical history significant for raynaud's.patient presented to outside emergency room complaining of abdominal distention and lower leg swelling. Patient states that this occurred over approximately 3 weeks.at that time her abdomen started to give out. Patient has no history of ascites. Patient began to develop lower extremity Swelling 2 days ago and it has been steadily worsening. Patient has no history of liver disease. Patient has no history of IV drug use. Patient denies using dirty tattoo parlors. Patient denies any past blood transfusions. Patient is unable to eat and drink normally. Patient denies any abdominal trauma. Type of Study: Bedside Swallow Evaluation Previous Swallow Assessment: n/a Diet Prior to this Study: Regular;Thin liquids (2 gm sodium) Temperature Spikes Noted: No Respiratory Status: Room air History of Recent Intubation: No Behavior/Cognition: Alert;Cooperative;Pleasant mood Oral Cavity Assessment: Within Functional Limits Oral Care Completed by SLP: No Oral Cavity - Dentition: Adequate natural dentition Vision: Functional for self-feeding Self-Feeding Abilities: Able to feed self Patient Positioning: Upright in bed Baseline Vocal Quality: Normal Volitional Cough: Strong Volitional Swallow: Able to elicit    Oral/Motor/Sensory Function Overall Oral Motor/Sensory Function: Within functional limits  Ice Chips Ice chips: Within functional limits Presentation: Spoon   Thin Liquid Thin Liquid: Within functional limits Presentation: Cup;Straw    Nectar Thick Nectar Thick Liquid:  Not tested   Honey Thick Honey Thick Liquid: Not tested   Puree Puree: Within functional limits Presentation: Spoon   Solid      Solid: Within functional limits Presentation: Self Fed        ADAMS,PAT, M.S., CCC-SLP 08/26/2015,3:41 PM

## 2015-08-27 LAB — IGG: IgG (Immunoglobin G), Serum: 2326 mg/dL — ABNORMAL HIGH (ref 700–1600)

## 2015-08-27 LAB — HEPATITIS B SURFACE ANTIBODY,QUALITATIVE: Hep B S Ab: NONREACTIVE

## 2015-08-27 LAB — HEPATITIS A ANTIBODY, TOTAL: Hep A Total Ab: NEGATIVE

## 2015-08-27 LAB — FANA STAINING PATTERNS: Homogeneous Pattern: 1:1280 {titer}

## 2015-08-27 LAB — ALPHA-1-ANTITRYPSIN: A-1 Antitrypsin, Ser: 165 mg/dL (ref 90–200)

## 2015-08-27 LAB — ANTINUCLEAR ANTIBODIES, IFA: ANA Ab, IFA: POSITIVE — AB

## 2015-08-29 LAB — CULTURE, BODY FLUID W GRAM STAIN -BOTTLE: Culture: NO GROWTH

## 2015-08-30 ENCOUNTER — Other Ambulatory Visit: Payer: Self-pay

## 2015-08-30 DIAGNOSIS — B179 Acute viral hepatitis, unspecified: Secondary | ICD-10-CM

## 2015-09-04 ENCOUNTER — Other Ambulatory Visit (INDEPENDENT_AMBULATORY_CARE_PROVIDER_SITE_OTHER): Payer: Self-pay

## 2015-09-04 DIAGNOSIS — B179 Acute viral hepatitis, unspecified: Secondary | ICD-10-CM

## 2015-09-04 DIAGNOSIS — K7200 Acute and subacute hepatic failure without coma: Secondary | ICD-10-CM

## 2015-09-04 LAB — BASIC METABOLIC PANEL
BUN: 12 mg/dL (ref 6–23)
CO2: 26 mEq/L (ref 19–32)
Calcium: 9.5 mg/dL (ref 8.4–10.5)
Chloride: 98 mEq/L (ref 96–112)
Creatinine, Ser: 0.34 mg/dL — ABNORMAL LOW (ref 0.40–1.20)
GFR: 210.77 mL/min (ref >60.00)
Glucose, Bld: 90 mg/dL (ref 70–99)
Potassium: 3.8 mEq/L (ref 3.5–5.1)
Sodium: 130 mEq/L — ABNORMAL LOW (ref 135–145)

## 2015-09-04 LAB — HEPATIC FUNCTION PANEL
ALT: 29 U/L (ref 0–35)
AST: 59 U/L — ABNORMAL HIGH (ref 0–37)
Albumin: 3.3 g/dL — ABNORMAL LOW (ref 3.5–5.2)
Alkaline Phosphatase: 87 U/L (ref 39–117)
Bilirubin, Direct: 1.2 mg/dL — ABNORMAL HIGH (ref 0.0–0.3)
Total Bilirubin: 2.2 mg/dL — ABNORMAL HIGH (ref 0.2–1.2)
Total Protein: 8.2 g/dL (ref 6.0–8.3)

## 2015-09-04 LAB — PROTIME-INR
INR: 1.6 ratio — ABNORMAL HIGH (ref 0.8–1.0)
Prothrombin Time: 17.5 s — ABNORMAL HIGH (ref 9.6–13.1)

## 2015-09-05 ENCOUNTER — Other Ambulatory Visit: Payer: Self-pay | Admitting: Gastroenterology

## 2015-09-05 ENCOUNTER — Other Ambulatory Visit: Payer: Self-pay | Admitting: *Deleted

## 2015-09-05 DIAGNOSIS — K7469 Other cirrhosis of liver: Secondary | ICD-10-CM

## 2015-09-05 DIAGNOSIS — K7460 Unspecified cirrhosis of liver: Secondary | ICD-10-CM

## 2015-09-09 ENCOUNTER — Other Ambulatory Visit: Payer: Self-pay | Admitting: *Deleted

## 2015-09-09 ENCOUNTER — Other Ambulatory Visit (INDEPENDENT_AMBULATORY_CARE_PROVIDER_SITE_OTHER): Payer: Self-pay

## 2015-09-09 DIAGNOSIS — K7460 Unspecified cirrhosis of liver: Secondary | ICD-10-CM

## 2015-09-09 DIAGNOSIS — R188 Other ascites: Principal | ICD-10-CM

## 2015-09-09 LAB — CBC WITH DIFFERENTIAL/PLATELET
Basophils Absolute: 0.1 10*3/uL (ref 0.0–0.1)
Basophils Relative: 1.9 % (ref 0.0–3.0)
Eosinophils Absolute: 0.3 10*3/uL (ref 0.0–0.7)
Eosinophils Relative: 5.4 % — ABNORMAL HIGH (ref 0.0–5.0)
HCT: 35.7 % — ABNORMAL LOW (ref 36.0–46.0)
Hemoglobin: 12 g/dL (ref 12.0–15.0)
Lymphocytes Relative: 38.3 % (ref 12.0–46.0)
Lymphs Abs: 2 10*3/uL (ref 0.7–4.0)
MCHC: 33.7 g/dL (ref 30.0–36.0)
MCV: 106.6 fl — ABNORMAL HIGH (ref 78.0–100.0)
Monocytes Absolute: 0.7 10*3/uL (ref 0.1–1.0)
Monocytes Relative: 13.8 % — ABNORMAL HIGH (ref 3.0–12.0)
Neutro Abs: 2.2 10*3/uL (ref 1.4–7.7)
Neutrophils Relative %: 40.6 % — ABNORMAL LOW (ref 43.0–77.0)
Platelets: 144 10*3/uL — ABNORMAL LOW (ref 150.0–400.0)
RBC: 3.35 Mil/uL — ABNORMAL LOW (ref 3.87–5.11)
RDW: 14.2 % (ref 11.5–15.5)
WBC: 5.3 10*3/uL (ref 4.0–10.5)

## 2015-09-09 LAB — COMPREHENSIVE METABOLIC PANEL
ALT: 28 U/L (ref 0–35)
AST: 53 U/L — ABNORMAL HIGH (ref 0–37)
Albumin: 3.4 g/dL — ABNORMAL LOW (ref 3.5–5.2)
Alkaline Phosphatase: 73 U/L (ref 39–117)
BUN: 11 mg/dL (ref 6–23)
CO2: 28 mEq/L (ref 19–32)
Calcium: 9.8 mg/dL (ref 8.4–10.5)
Chloride: 102 mEq/L (ref 96–112)
Creatinine, Ser: 0.39 mg/dL — ABNORMAL LOW (ref 0.40–1.20)
GFR: 179.9 mL/min (ref >60.00)
Glucose, Bld: 97 mg/dL (ref 70–99)
Potassium: 4.4 mEq/L (ref 3.5–5.1)
Sodium: 134 mEq/L — ABNORMAL LOW (ref 135–145)
Total Bilirubin: 2.2 mg/dL — ABNORMAL HIGH (ref 0.2–1.2)
Total Protein: 8.3 g/dL (ref 6.0–8.3)

## 2015-09-09 LAB — PROTIME-INR
INR: 1.5 ratio — ABNORMAL HIGH (ref 0.8–1.0)
Prothrombin Time: 16 s — ABNORMAL HIGH (ref 9.6–13.1)

## 2015-09-10 ENCOUNTER — Telehealth: Payer: Self-pay | Admitting: *Deleted

## 2015-09-10 ENCOUNTER — Encounter: Payer: Self-pay | Admitting: Physician Assistant

## 2015-09-10 ENCOUNTER — Ambulatory Visit (INDEPENDENT_AMBULATORY_CARE_PROVIDER_SITE_OTHER): Payer: Self-pay | Admitting: Physician Assistant

## 2015-09-10 VITALS — BP 120/80 | HR 76 | Ht 62.5 in | Wt 87.6 lb

## 2015-09-10 DIAGNOSIS — K7290 Hepatic failure, unspecified without coma: Secondary | ICD-10-CM

## 2015-09-10 DIAGNOSIS — R188 Other ascites: Secondary | ICD-10-CM

## 2015-09-10 DIAGNOSIS — K7460 Unspecified cirrhosis of liver: Secondary | ICD-10-CM

## 2015-09-10 MED ORDER — SPIRONOLACTONE 50 MG PO TABS: ORAL_TABLET | ORAL | Status: DC

## 2015-09-10 NOTE — Progress Notes (Signed)
Patient ID: Launa Flightaula D Rish, female   DOB: 12/20/1960, 55 y.o.   MRN: [id number]   Subjective:    Patient ID: Launa Flightaula D Fudala, female    DOB: 09/19/1960, 55 y.o.   MRN: [id number]  HPI Sanda LingerCollett is a 55 year old white female, recently established with Dr. Adela LankArmbruster, having been seen in consultation during recent hospitalization. She was admitted with ascites, anasarca and jaundice of unclear etiology. Workup revealed decompensated cirrhosis, MRI does not show any evidence of hepatocellular carcinoma She was successfully diuresis during hospitalization. 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 Hepatitis serologies were negative, a NA is positive with a titer of 03-1278 on the afferent act in IgG elevated at 53, and IgG of 2326. It says she's been doing well since hospitalization is been following a very strict 2 g sodium diet. Feels that her weight is down about 10 pounds. She has no residual edema or appreciable ascites. Her only complaint is fatigue Most recent labs done 09/09/2015 hemoglobin of 12 platelets 144 INR 1.5 ProTime 16, total bili of 2.2 AST of 53 ALT 28, serum sodium 134 and creatinine of 0.39. Most parameters have improved Patient is currently scheduled for transjugular liver biopsy on 09/13/2015 No current EtOH use.   . Review of Systems Pertinent positive and negative review of systems were noted in the above HPI section.  All other review of systems was otherwise negative.  Outpatient Encounter Prescriptions as of 09/10/2015  Medication Sig  . feeding supplement, ENSURE ENLIVE, (ENSURE ENLIVE) LIQD Take 237 mLs by mouth 2 (two) times daily between meals.  . folic acid (FOLVITE) 1 MG tablet Take 1 tablet (1 mg total) by mouth daily.  . furosemide (LASIX) 20 MG tablet Take 1 tablet (20 mg total) by mouth daily.  . Multiple Vitamin (MULTIVITAMIN WITH MINERALS) TABS tablet Take 1  tablet by mouth daily.  Marland Kitchen. spironolactone (ALDACTONE) 50 MG tablet Take 1/2 tablet ( 25 mg ) daily.  Marland Kitchen. thiamine 100 MG tablet Take 1 tablet (100 mg total) by mouth daily.  . [DISCONTINUED] spironolactone (ALDACTONE) 50 MG tablet Take 1 tablet (50 mg total) by mouth daily.  . [DISCONTINUED] amoxicillin-clavulanate (AUGMENTIN) 875-125 MG tablet Take 1 tablet by mouth 2 (two) times daily.   No facility-administered encounter medications on file as of 09/10/2015.   Allergies  Allergen Reactions  . Oxycodone-Acetaminophen Nausea And Vomiting and Other (See Comments)    Hallucinations and bad dreams   Patient Active Problem List   Diagnosis Date Noted  . Hypoxia 08/24/2015  . Pleural effusion 08/24/2015  . Elevated bilirubin 08/24/2015  . Liver cirrhosis (HCC) 08/24/2015  . Elevated INR 08/24/2015  . Anemia 08/24/2015  . Cirrhosis of liver with ascites (HCC)   . Ascites 08/23/2015   Social History   Social History  . Marital Status: Divorced    Spouse Name: N/A  . Number of Children: N/A  . Years of Education: N/A   Occupational History  . N/A    Social History Main Topics  . Smoking status: Never Smoker   . Smokeless tobacco: Never Used  . Alcohol Use: 0.0 oz/week    0 Standard drinks or equivalent per week     Comment: occasional  . Drug Use: No  . Sexual Activity: Not Currently   Other Topics Concern  . Not on file   Social History Narrative    Ms. Rabbani's family history includes Dementia  in her father; Parkinson's disease in her mother; Stroke in her brother.      Objective:    Filed Vitals:   09/10/15 1015  BP: 120/80  Pulse: 76    Physical Exam  well-developed white female in no acute distress, accompanied by a friend, very pleasant blood pressure 120/80 pulse 76 foot 2 weight 87 BMI of 15.7. HEENT; nontraumatic normocephalic EOMI PERRLA sclera anicteric, Cardiovascular ;regular rate and rhythm with S1-S2 no murmur or gallop, Pulmonary ;clear  bilaterally, Abdomen ;soft nontender nondistended no appreciable, no palpable mass or hepatosplenomegaly bowel sounds present, Rectal; exam not done, Extremities no clubbing cyanosis or edema skin warm and dry, Neuropsych ;mood and affect affect appropriate no asterixis     Assessment & Plan:   #571 55 year old white female with new diagnosis of decompensated cirrhosis presenting with anasarca. Autoimmune markers are positive and most likely etiology is underlying autoimmune liver disease. Patient has responded well to diuretics and has no current residual edema or ascites  #2) underweight with BMI of 15  Plan; patient will have transjugular liver biopsy done on 09/13/2015. We discussed this further today and questions were answered  We'll plan to repeat labs in 2 weeks. And schedule office follow-up with Dr. Adela LankArmbruster in 2-3 weeks She'll continue a 2 g sodium diet Will decrease Lasix to 10 mg by mouth every morning and Aldactone to 25 mg by mouth every morning-anticipate we will be able to stop diuretics. I have asked her to call the office if she loses more than 2 pounds prior to her next office visit. We discussed upper endoscopy today for screening for esophageal varices. This will need to be scheduled time of her next office visit. We also discussed initiating the hepatitis A vaccine/Twinrix-prefers to wait until after she has the liver biopsy and this can be started at next office visit as well.Wayne Both.  Amy S Esterwood PA-C 09/10/2015   Cc: No ref. provider found

## 2015-09-10 NOTE — Telephone Encounter (Signed)
Called the patient to advise her I [address] [facility] [address]  The patient asked if I could get the Lasix ( furosemide)  in 10 mg.  I asked the pharmacist but he said they could only get 20 mg tablets.  She said she was sure her brother gets them in 10 mg tablets.  When she looked at his bottle of furosemide, it said 20 mg tablets.  She was going to look at a medication list for Medicare and if she can find  Furosemide 10 mg tablets, she will call me and let me know. She said the 20 mg tablets are so small and had to cut, even though they are scored.

## 2015-09-10 NOTE — Patient Instructions (Addendum)
Please go to the basement level to have your labs drawn. On 09-23-2015.  The lab opens at 7:30 am and they close at 5:30 PM. They take lunch between 1:30 - 2:00 PM.   Decrease Aldactone to 25 mg, take 1 tab  every morning.  Decrease Furosemide to 10 mg , Take 1 tab every morning.   .  If you are age 57 or younger, your body mass index should be between 19-25. Your Body mass index is 15.76 kg/(m^2). If this is out of the aformentioned range listed, please consider follow up with your Primary Care Provider.

## 2015-09-10 NOTE — Progress Notes (Signed)
Agree with assessment and plan as outlined. Will await liver biopsy, if confirms AIH with active inflammation she would warrant therapy for this. I will further discuss with her in clinic.

## 2015-09-11 ENCOUNTER — Telehealth: Payer: Self-pay | Admitting: Physician Assistant

## 2015-09-11 ENCOUNTER — Other Ambulatory Visit: Payer: Self-pay | Admitting: Radiology

## 2015-09-11 NOTE — Telephone Encounter (Signed)
Confirmed with IR. She is NPO after 7 am on 09/13/15. She may eat a light breakfast before then. She may take her medications. If she takes her medications after 7 am, it must be with water only. Called back to patient. No answer. No voicemail.

## 2015-09-11 NOTE — Telephone Encounter (Signed)
Discussed with the patient.

## 2015-09-11 NOTE — Telephone Encounter (Signed)
See note from 09-10-2015.

## 2015-09-12 ENCOUNTER — Other Ambulatory Visit: Payer: Self-pay | Admitting: Radiology

## 2015-09-13 ENCOUNTER — Ambulatory Visit (HOSPITAL_COMMUNITY)
Admission: RE | Admit: 2015-09-13 | Discharge: 2015-09-13 | Disposition: A | Discharge status: Home / Self Care | Payer: Self-pay | Source: Ambulatory Visit | Attending: Gastroenterology | Admitting: Gastroenterology

## 2015-09-13 ENCOUNTER — Other Ambulatory Visit: Payer: Self-pay | Admitting: Gastroenterology

## 2015-09-13 ENCOUNTER — Telehealth: Payer: Self-pay | Admitting: Gastroenterology

## 2015-09-13 ENCOUNTER — Encounter (HOSPITAL_COMMUNITY): Payer: Self-pay

## 2015-09-13 DIAGNOSIS — K754 Autoimmune hepatitis: Secondary | ICD-10-CM | POA: Insufficient documentation

## 2015-09-13 DIAGNOSIS — K7460 Unspecified cirrhosis of liver: Secondary | ICD-10-CM

## 2015-09-13 DIAGNOSIS — R188 Other ascites: Secondary | ICD-10-CM | POA: Insufficient documentation

## 2015-09-13 DIAGNOSIS — R011 Cardiac murmur, unspecified: Secondary | ICD-10-CM | POA: Insufficient documentation

## 2015-09-13 DIAGNOSIS — K766 Portal hypertension: Secondary | ICD-10-CM | POA: Insufficient documentation

## 2015-09-13 DIAGNOSIS — I7300 Raynaud's syndrome without gangrene: Secondary | ICD-10-CM | POA: Insufficient documentation

## 2015-09-13 LAB — CBC WITH DIFFERENTIAL/PLATELET
Basophils Absolute: 0.1 10*3/uL (ref 0.0–0.1)
Basophils Relative: 2 %
Eosinophils Absolute: 0.3 10*3/uL (ref 0.0–0.7)
Eosinophils Relative: 5 %
HCT: 34.7 % — ABNORMAL LOW (ref 36.0–46.0)
Hemoglobin: 12.1 g/dL (ref 12.0–15.0)
Lymphocytes Relative: 41 %
Lymphs Abs: 2.3 10*3/uL (ref 0.7–4.0)
MCH: 35.7 pg — ABNORMAL HIGH (ref 26.0–34.0)
MCHC: 34.9 g/dL (ref 30.0–36.0)
MCV: 102.4 fL — ABNORMAL HIGH (ref 78.0–100.0)
Monocytes Absolute: 0.7 10*3/uL (ref 0.1–1.0)
Monocytes Relative: 12 %
Neutro Abs: 2.2 10*3/uL (ref 1.7–7.7)
Neutrophils Relative %: 41 %
Platelets: 119 10*3/uL — ABNORMAL LOW (ref 150–400)
RBC: 3.39 MIL/uL — ABNORMAL LOW (ref 3.87–5.11)
RDW: 13.3 % (ref 11.5–15.5)
WBC: 5.5 10*3/uL (ref 4.0–10.5)

## 2015-09-13 LAB — COMPREHENSIVE METABOLIC PANEL
ALT: 33 U/L (ref 14–54)
AST: 67 U/L — ABNORMAL HIGH (ref 15–41)
Albumin: 3.4 g/dL — ABNORMAL LOW (ref 3.5–5.0)
Alkaline Phosphatase: 95 U/L (ref 38–126)
Anion gap: 8 (ref 5–15)
BUN: 16 mg/dL (ref 6–20)
CO2: 25 mmol/L (ref 22–32)
Calcium: 9.3 mg/dL (ref 8.9–10.3)
Chloride: 103 mmol/L (ref 101–111)
Creatinine, Ser: 0.41 mg/dL — ABNORMAL LOW (ref 0.44–1.00)
GFR calc Af Amer: >60 mL/min (ref >60)
GFR calc non Af Amer: >60 mL/min (ref >60)
Glucose, Bld: 97 mg/dL (ref 65–99)
Potassium: 3.5 mmol/L (ref 3.5–5.1)
Sodium: 136 mmol/L (ref 135–145)
Total Bilirubin: 1.8 mg/dL — ABNORMAL HIGH (ref 0.3–1.2)
Total Protein: 8.8 g/dL — ABNORMAL HIGH (ref 6.5–8.1)

## 2015-09-13 LAB — APTT: aPTT: 38 seconds — ABNORMAL HIGH (ref 24–37)

## 2015-09-13 LAB — PROTIME-INR
INR: 1.43 (ref 0.00–1.49)
Prothrombin Time: 17 seconds — ABNORMAL HIGH (ref 11.6–15.2)

## 2015-09-13 MED ORDER — LIDOCAINE HCL 1 % IJ SOLN
INTRAMUSCULAR | Status: AC
  Filled 2015-09-13: qty 20

## 2015-09-13 MED ORDER — MIDAZOLAM HCL 2 MG/2ML IJ SOLN
INTRAMUSCULAR | Status: AC | PRN
  Administered 2015-09-13: 0.5 mg via INTRAVENOUS
  Administered 2015-09-13: 1 mg via INTRAVENOUS
  Administered 2015-09-13: 0.5 mg via INTRAVENOUS

## 2015-09-13 MED ORDER — MIDAZOLAM HCL 2 MG/2ML IJ SOLN
INTRAMUSCULAR | Status: AC
  Filled 2015-09-13: qty 6

## 2015-09-13 MED ORDER — SODIUM CHLORIDE 0.9 % IV SOLN
INTRAVENOUS | Status: DC
  Administered 2015-09-13: 12:00:00 via INTRAVENOUS

## 2015-09-13 MED ORDER — LIDOCAINE HCL 1 % IJ SOLN
INTRAMUSCULAR | Status: AC | PRN
  Administered 2015-09-13: 5 mL

## 2015-09-13 MED ORDER — FENTANYL CITRATE (PF) 100 MCG/2ML IJ SOLN
INTRAMUSCULAR | Status: AC | PRN
  Administered 2015-09-13 (×2): 25 ug via INTRAVENOUS

## 2015-09-13 MED ORDER — IOPAMIDOL (ISOVUE-300) INJECTION 61%
50.0000 mL | Freq: Once | INTRAVENOUS | Status: AC | PRN
  Administered 2015-09-13: 30 mL via INTRAVENOUS

## 2015-09-13 MED ORDER — FENTANYL CITRATE (PF) 100 MCG/2ML IJ SOLN
INTRAMUSCULAR | Status: AC
  Filled 2015-09-13: qty 4

## 2015-09-13 NOTE — Consult Note (Signed)
Chief Complaint: Patient was seen in consultation today for image guided transjugular liver biopsy  Referring Physician(s): Ruffin Frederick  Supervising Physician: Richarda Overlie  Patient Status: Outpatient  History of Present Illness: Michelle Schneider is a 55 y.o. 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 She presents today for image guided transjugular liver biopsy for further evaluation.  Past Medical History  Diagnosis Date  . Family history of adverse reaction to anesthesia     cousin had a difficult time waking up after appendectomy  . Heart murmur     "diagnosed with a very slight heart murmur in 2001 but no other doctor has been able to hear it"  . Raynaud's disease     Past Surgical History  Procedure Laterality Date  . Abdominal hysterectomy    . Orif humerus fracture Right 05/23/2015    Procedure: OPEN REDUCTION INTERNAL FIXATION (ORIF) RIGHT  PROXIMAL HUMERUS FRACTURE;  Surgeon: Francena Hanly, MD;  Location: [facility] OR;  Service: Orthopedics;  Laterality: Right;    Allergies: Oxycodone-acetaminophen  Medications: Prior to Admission medications   Medication Sig Start Date End Date Taking? Authorizing Provider  feeding supplement, ENSURE ENLIVE, (ENSURE ENLIVE) LIQD Take 237 mLs by mouth 2 (two) times daily between meals. 08/26/15  Yes Starleen Arms, MD  folic acid (FOLVITE) 1 MG tablet Take 1 tablet (1 mg total) by mouth daily. 08/26/15  Yes Leana Roe Elgergawy, MD  furosemide (LASIX) 20 MG tablet Take 1 tablet (20 mg total) by mouth daily. 08/26/15  Yes Starleen Arms, MD  Multiple Vitamin (MULTIVITAMIN WITH MINERALS) TABS tablet Take 1 tablet by mouth daily. 08/26/15  Yes Starleen Arms, MD  spironolactone (ALDACTONE) 50 MG tablet Take 1/2 tablet ( 25 mg ) daily. 09/10/15  Yes Amy S Esterwood, PA-C  thiamine  (VITAMIN B-1) 100 MG tablet Take 100 mg by mouth daily.   Yes Historical Provider, MD  thiamine 100 MG tablet Take 1 tablet (100 mg total) by mouth daily. 08/26/15   Starleen Arms, MD     Family History  Problem Relation Age of Onset  . Dementia Father   . Parkinson's disease Mother   . Stroke Brother     Social History   Social History  . Marital Status: Divorced    Spouse Name: N/A  . Number of Children: N/A  . Years of Education: N/A   Occupational History  . N/A    Social History Main Topics  . Smoking status: Never Smoker   . Smokeless tobacco: Never Used  . Alcohol Use: 0.0 oz/week    0 Standard drinks or equivalent per week     Comment: occasional  . Drug Use: No  . Sexual Activity: Not Currently   Other Topics Concern  . None   Social History Narrative     Review of Systems currently denies fever, headache, chest pain, dyspnea, cough, back pain, nausea, vomiting or abnormal bleeding. She has had some abdominal bloating/mild discomfort as well as weight loss.  Vital Signs: BP 136/83 mmHg  Pulse 77  Temp(Src) 98.1 F (36.7 C) (Oral)  Resp 16  Ht 5' 2.5" (1.588 m)  Wt 87 lb 9.6 oz (39.735 kg)  BMI 15.76 kg/m2  SpO2 100%  Physical Exam patient awake, alert. Chest clear to auscultation bilaterally. Heart with regular rate and rhythm. Abdomen soft, mildly distended, positive bowel sounds, nontender; lower extremities  with no edema  Mallampati Score:     Imaging: [place] Chest  08/24/2015  CLINICAL DATA:  Ultrasound of the chest prior to possible thoracentesis. Status post paracentesis yesterday. EXAM: CHEST ULTRASOUND COMPARISON:  Chest X-ray done 08/23/2015. FINDINGS: Limited ultrasound survey for pleural fluid was performed of the right chest. There is a minimal amount of pleural fluid. There is no significant pocket of fluid to allow for a safe approach for thoracentesis. IMPRESSION: Minimal pleural effusion. Read by:  Corrin ParkerWendy Blair, PA-C Electronically  Signed   By: Malachy MoanHeath  McCullough M.D.   On: 08/24/2015 10:51   [place] Abdomen Complete  08/23/2015  CLINICAL DATA:  Abdominal pain and swelling for 2 weeks. Elevated bilirubin and AST level. EXAM: ABDOMEN ULTRASOUND COMPLETE COMPARISON:  08/23/2015 radiographs FINDINGS: Gallbladder: Gallbladder wall thickening, 6 mm in thickness. Shadowing small calculi in the gallbladder along with sludge. Sonographic Murphy's sign absent. Common bile duct: Diameter: 4 mm Liver: No focal lesion identified. Nodular margin, suspicious for cirrhosis. IVC: No abnormality visualized. Pancreas: Not well seen except a small portion of the pancreatic body due to overlying bowel gas. Spleen: No splenomegaly.  Spleen measures 8.6 cm vertically. Right Kidney: Length: 11.3 cm. Echogenicity within normal limits. No mass or hydronephrosis visualized. Left Kidney: Length: 11.2 cm. Echogenicity within normal limits. No mass or hydronephrosis visualized. Abdominal aorta: No aneurysm visualized. Other findings: Large volume ascites.  Bilateral pleural effusions. IMPRESSION: 1. Shrunken nodular liver favoring cirrhosis.  No splenomegaly seen. 2. Ascites and bilateral pleural effusions. 3. Cholelithiasis also with sludge in the gallbladder. Prominent gallbladder wall thickening, although this could be due to the patient's hypoalbuminemia rather than necessarily being from inflammation. 4. Ascites and pleural effusions. 5. Poor visualization of the pancreas. Electronically Signed   By: Gaylyn RongWalter  Liebkemann M.D.   On: 08/23/2015 18:41   Ct Abdomen Pelvis W Contrast  08/25/2015  CLINICAL DATA:  Acute onset of elevated LFTs and ascites. Abdominal distention. Initial encounter. EXAM: CT ABDOMEN AND PELVIS WITH CONTRAST TECHNIQUE: Multidetector CT imaging of the abdomen and pelvis was performed using the standard protocol following bolus administration of intravenous contrast. CONTRAST:  100mL ISOVUE-300 IOPAMIDOL (ISOVUE-300) INJECTION 61% COMPARISON:  None.  FINDINGS: Small bilateral pleural effusions are noted. Diffuse right-sided airspace opacification raises suspicion for pneumonia. Mild left basilar atelectasis is noted. Small volume ascites is noted tracking about the liver and spleen, extending along the paracolic gutters and into the pelvis. The nodular contour of the liver is compatible with hepatic cirrhosis. Scattered foci of decreased attenuation are seen within the liver, without a definite dominant mass, though small masses cannot be excluded. The spleen is grossly unremarkable in appearance. Stones are noted within the gallbladder. The gallbladder is difficult to fully assess due to surrounding ascites. The pancreas and adrenal glands are unremarkable. The kidneys are unremarkable in appearance. There is no evidence of hydronephrosis. No renal or ureteral stones are seen. No perinephric stranding is appreciated. The small bowel is unremarkable in appearance. The stomach is within normal limits. No acute vascular abnormalities are seen. The appendix is normal in caliber, without evidence of appendicitis. The colon is grossly unremarkable in appearance. Contrast progresses to the level of the rectum. Soft tissue edema is noted along the abdominal and pelvic wall, including at both flanks. Would correlate for any evidence of mild anasarca. The bladder is mildly distended and grossly unremarkable. The patient is status post hysterectomy. No suspicious adnexal masses are seen. No inguinal lymphadenopathy is seen. No acute osseous abnormalities are  identified. IMPRESSION: 1. Diffuse right basilar airspace opacification raises suspicion for pneumonia. 2. Small bilateral pleural effusions noted. Mild left basilar atelectasis seen. 3. Small volume ascites within the abdomen and pelvis. 4. Findings of hepatic cirrhosis. Scattered foci of decreased attenuation within the liver, without a definite dominant mass, though small masses cannot be excluded. 5.  Cholelithiasis. Gallbladder difficult to fully assess due to surrounding ascites. 6. Soft tissue edema along the abdominal and pelvic wall, including at both flanks. Would correlate for any evidence of mild anasarca. Electronically Signed   By: Roanna Raider M.D.   On: 08/25/2015 00:56   Mr Liver W Wo Contrast  08/26/2015  CLINICAL DATA:  55 year old female with evidence of cirrhosis on recent CT examination. Evaluate for underlying liver lesions. EXAM: MRI ABDOMEN WITHOUT AND WITH CONTRAST TECHNIQUE: Multiplanar multisequence MR imaging of the abdomen was performed both before and after the administration of intravenous contrast. CONTRAST:  5 mL of Eovist. COMPARISON:  No prior abdominal MRI. CT the abdomen and pelvis 08/24/2015. FINDINGS: Lower chest: Irregular areas of signal intensity within the lung bases bilaterally (right greater than left), predominantly in the right lower lobe, concerning for consolidation. Small bilateral pleural effusions lying dependently (right greater than left). Moderate-sized hiatal hernia. Hepatobiliary: The liver has a shrunken appearance and nodular contour, compatible with underlying cirrhosis. No discrete cystic or solid hepatic lesions are identified. Specifically, no hypervascular hepatic lesion noted to suggest underlying hepatocellular carcinoma at this time. No intra or extrahepatic biliary ductal dilatation. Dependent material in the gallbladder is T2 hypointense and T1 hyperintense, compatible with biliary sludge and/or tiny gallstones. Gallbladder does not appear distended. Pancreas: No pancreatic mass. No pancreatic ductal dilatation. No pancreatic or peripancreatic organized fluid collections. Small amount of T2 signal intensity adjacent to the pancreas throughout the retroperitoneum, likely related to underlying anasarca. Spleen: Unremarkable. Adrenals/Urinary Tract: Kidneys and bilateral adrenal glands are normal in appearance. No hydroureteronephrosis in the  visualized abdomen. Stomach/Bowel: Visualized portions are unremarkable. Vascular/Lymphatic: No aneurysm identified in the visualized abdominal vasculature. No lymphadenopathy noted in the abdomen. Other: Large volume of ascites. Diffuse soft tissue edema in the retroperitoneum and abdominal wall. Musculoskeletal: No aggressive osseous lesions are noted in the visualized portions of the skeleton. IMPRESSION: 1. Morphologic changes in the liver compatible with underlying cirrhosis. No hypervascular lesion identified to suggest underlying hepatocellular carcinoma at this time. 2. Large volume of ascites, retroperitoneal edema and diffuse body wall edema with bilateral pleural effusions, suggestive of a state of anasarca. 3. Patchy areas of increased signal intensity throughout the lung bases, concerning for consolidation from either infection or aspiration. Electronically Signed   By: Trudie Reed M.D.   On: 08/26/2015 07:40   Dg Abd Acute W/chest  08/23/2015  CLINICAL DATA:  55 year old female with a history of abdominal distention and edema. EXAM: DG ABDOMEN ACUTE W/ 1V CHEST COMPARISON:  None. FINDINGS: Chest: Cardiomediastinal silhouette partially obscured by overlying lung and pleural disease. Dense opacity at the right base obscuring the right hemidiaphragm and the right heart border. Opacity at the left base in the retrocardiac region blunting the left costophrenic angle an the left cardiophrenic angle. No pneumothorax. Calcifications of the aortic arch. Surgical changes of the right humerus for fixation of humeral fracture. Abdomen: Gas within stomach, small bowel, colon. No abnormally distended small bowel or colon. Soft tissue density at the periphery of the abdomen, with bowel gas centralized. No unexpected calcifications. No unexpected radiopaque foreign body. No displaced fracture. Mild degenerative changes of  the lower lumbar spine. IMPRESSION: Chest: Bilateral right greater than left pleural  effusions with associated atelectasis/consolidation. Abdomen: Nonobstructive bowel gas pattern. Soft tissue density at the periphery of the abdomen with centralized small bowel loops, potentially representing ascites. This may be better assessed with a dedicated ultrasound, or, if there is concern for acute intra-abdominal process, cross-sectional imaging with CT may be considered. Signed, Yvone NeuJaime S. Loreta AveWagner, DO [facility] Electronically Signed   By: Gilmer MorJaime  Wagner D.O.   On: 08/23/2015 17:15    Labs:  CBC:  Recent Labs  08/24/15 0518 08/25/15 0448 08/26/15 0404 09/09/15 1216  WBC 5.7 6.7 6.5 5.3  HGB 9.7* 10.1* 10.3* 12.0  HCT 29.7* 30.6* 31.4* 35.7*  PLT 92* 91* 85* 144.0*    COAGS:  Recent Labs  05/22/15 0849 08/23/15 1641 08/24/15 0518 09/04/15 1518 09/09/15 1216  INR 1.34 1.43 1.63* 1.6* 1.5*  APTT 37  --  39*  --   --     BMP:  Recent Labs  08/23/15 1641 08/24/15 0518 08/25/15 0448 08/26/15 0404 09/04/15 1518 09/09/15 1216  NA 135 138 136 132* 130* 134*  K 3.2* 3.6 3.2* 3.2* 3.8 4.4  CL 104 108 106 104 98 102  CO2 20* 20* 19* 21* 26 28  GLUCOSE 97 68 86 95 90 97  BUN 8 5* 7 <5* 12 11  CALCIUM 8.1* 8.1* 8.3* 8.1* 9.5 9.8  CREATININE 0.37* 0.37* 0.55 0.36* 0.34* 0.39*  GFRNONAA >60 >60 >60 >60  --   --   GFRAA >60 >60 >60 >60  --   --     LIVER FUNCTION TESTS:  Recent Labs  08/25/15 0448 08/26/15 0404 09/04/15 1518 09/09/15 1216  BILITOT 5.4* 4.5* 2.2* 2.2*  AST 100* 89* 59* 53*  ALT 30 30 29 28   ALKPHOS 75 75 87 73  PROT 6.7 6.6 8.2 8.3  ALBUMIN 2.4* 2.1* 3.3* 3.4*    TUMOR MARKERS:  Recent Labs  08/25/15 1308  AFPTM 13.5*    Assessment and Plan: 55 y.o. 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 She presents today for image guided  transjugular liver biopsy for further evaluation.Risks and benefits discussed with the patient including, but not limited to bleeding, infection, damage to adjacent structures or low yield requiring additional tests.All of the patient's questions were answered, patient is agreeable to proceed.Consent signed and in chart. LABS PENDING.      Thank you for this interesting consult.  I greatly enjoyed meeting Michelle Schneider and look forward to participating in their care.  A copy of this report was sent to the requesting provider on this date.  Electronically Signed: D. Jeananne RamaKevin Allred 09/13/2015, 12:13 PM   I spent a total of 20 minutes  in face to face in clinical consultation, greater than 50% of which was counseling/coordinating care for image guided transjugular liver biopsy

## 2015-09-13 NOTE — Procedures (Signed)
Successful transjugular liver biopsy.  4 cores obtained.  Hepatic pressures also obtained.  Hepatic pressures are compatible with portal hypertension, see report in PACS.  No immediate complication.  Minimal blood loss.

## 2015-09-13 NOTE — Telephone Encounter (Signed)
Patient states Michelle Schneider, [place] decreased her diurectics. Today her stomach is hard again and she increased the medications back to regular doses.

## 2015-09-13 NOTE — Discharge Instructions (Signed)
Liver Biopsy The liver is a large organ in the upper right-hand side of your abdomen. A liver biopsy is a procedure in which a tissue sample is taken from the liver and examined under a microscope. The procedure is done to confirm a suspected problem. There are three types of liver biopsies:  Percutaneous. In this type, an incision is made in your abdomen. The sample is removed through the incision with a needle.  Laparoscopic. In this type, several incisions are made in the abdomen. A tiny camera is passed through one of the incisions to help guide the health care provider. The sample is removed through the other incision or incisions.  Transjugular. In this type, an incision is made in the neck. A tube is passed through the incision to the liver. The sample is removed through the tube with a needle. LET [facility] CARE PROVIDER KNOW ABOUT:  Any allergies you have.  All medicines you are taking, including vitamins, herbs, eye drops, creams, and over-the-counter medicines.  Previous problems you or members of your family have had with the use of anesthetics.  Any blood disorders you have.  Previous surgeries you have had.  Medical conditions you have.  Possibility of pregnancy, if this applies. RISKS AND COMPLICATIONS Generally, this is a safe procedure. However, problems can occur and include:  Bleeding.  Infection.  Bruising.  Collapsed lung.  Leak of digestive juices (bile) from the liver or gallbladder.  Problems with heart rhythm.  Pain at the biopsy site or in the right shoulder.  Low blood pressure (hypotension).  Injury to nearby organs or tissues. BEFORE THE PROCEDURE  Your health care provider may do some blood or urine tests. These will help your health care provider learn how well your kidneys and liver are working and how well your blood clots.  Ask your health care provider if you will be able to go home the day of the procedure. Arrange for someone to  take you home and stay with you for at least 24 hours.  Do not eat or drink anything after midnight on the night before the procedure or as directed by your health care provider.  Ask your health care provider about:  Changing or stopping your regular medicines. This is especially important if you are taking diabetes medicines or blood thinners.  Taking medicines such as aspirin and ibuprofen. These medicines can thin your blood. Do not take these medicines before your procedure if your health care provider asks you not to. PROCEDURE Regardless of the type of biopsy that will be done, you will have an IV line placed. Through this line, you will receive fluids and medicine to relax you. If you will be having a laparoscopic biopsy, you may also receive medicine through this line to make you sleep during the procedure (general anesthetic). Percutaneous Liver Biopsy  You will positioned on your back, with your right hand over your head.  A health care provider will locate your liver by tapping and pressing on the right side of your abdomen or with the help of an ultrasound machine or CT scan.  An area at the bottom of your last right rib will be numbed.  An incision will be made in the numbed area.  The biopsy needle will be inserted into the incision.  Several samples of liver tissue will be taken with the biopsy needle. You will be asked to hold your breath as each sample is taken. Laparoscopic Liver Biopsy  You will be  positioned on your back.  Several small incisions will be made in your abdomen.  Your doctor will pass a tiny camera through one incision. The camera will allow the liver to be viewed on a TV monitor in the operating room.  Tools will be passed through the other incision or incisions. These tools will be used to remove samples of liver tissue. Transjugular Liver Biopsy  You will be positioned on your back on an X-ray table, with your head turned to your left.  An  area on your neck just over your jugular vein will be numbed.  An incision will be made in the numbed area.  A tiny tube will be inserted through the incision. It will be pushed through the jugular vein to a blood vessel in the liver called the hepatic vein.  Dye will be inserted through the tube, and X-rays will be taken. The dye will make the blood vessels in the liver light up on the X-rays.  The biopsy needle will be pushed through the tube until it reaches the liver.  Samples of liver tissue will be taken with the biopsy needle.  The needle and the tube will be removed. After the samples are obtained, the incision or incisions will be closed. AFTER THE PROCEDURE  You will be taken to a recovery area.  You may have to lie on your right side for 1-2 hours. This will prevent bleeding from the biopsy site.  Your progress will be watched. Your blood pressure, pulse, and the biopsy site will be checked often.  You may have some pain or feel sick. If this happens, tell your health care provider.  As you begin to feel better, you will be offered ice and beverages.  You may be allowed to go home when the medicines have worn off and you can walk, drink, eat, and use the bathroom.   This information is not intended to replace advice given to you by your health care provider. Make sure you discuss any questions you have with your health care provider.   Document Released: 05/23/2003 Document Revised: 03/23/2014 Document Reviewed: 04/28/2013 Elsevier Interactive Patient Education 2016 Elsevier Inc[employer] Venogram A venogram, or venography, is a procedure to look at the veins using X-ray and dye (contrast). Contrast helps the veins show up on X-rays. A venography evaluates vein abnormalities, identifies clots within veins such as deep vein thrombosis (DVT), and evaluates swelling in an arm or leg. [facility] KNOW ABOUT:   Any allergies you have, especially to medicines,  shellfish, iodine, and contrast.  All medicines you are taking, including vitamins, herbs, eye drops, creams, and over-the-counter medicines.  Any previous complications from this or other procedures.  Any smoking history.  Any possibility of pregnancy.  Any blood disorders you have.  Previous surgeries you have had.  Medical conditions you have. RISKS AND COMPLICATIONS  Generally, this is a safe procedure. However, as with any procedure, problems can occur. Possible problems include:  Blood clots.  Kidney problems.  Allergic reaction to the contrast.  Bleeding.  Infection.  X-ray exposure. BEFORE THE PROCEDURE   Ask your health care provider about changing or stopping your regular medicines. This is especially important if you are taking diabetes medicines or blood thinners.  Your health care provider may want you to have blood tests. These tests can help tell how well your kidneys and liver are working. They can also show how well your blood clots.   Do not eat  or drink anything after midnight on the night before the procedure or as directed by your health care provider.  A blood sample may be drawn.  An IV tube will be placed into a vein in your arm or leg.  You will be asked to remove your clothing and put on a hospital gown.  You may be asked to remove your watch or jewelry.  Arrange for someone to drive you home after the procedure. If you receive pain medicine or sedatives during the procedure, it will be unsafe for you to drive for a few hours after the procedure. PROCEDURE   You will have an IV tube placed in a vein. This is where your health care provider will inject the dye. You may also be given medicine through the IV tube to help you relax (sedation).  During the exam, you will lie on an X-ray table. The table may be tilted in different directions during the procedure to help the dye move throughout your body. Safety straps will keep you secure if the  table is tilted.  If the veins to be evaluated are in your arm or leg, a band may be wrapped around that arm or leg to make the veins stay full of blood. You may feel like your arm or leg is going to sleep.  When the contrast is injected into your vein, you may notice a hot, flushed feeling as it moves throughout your body. You may also notice a metallic taste in your mouth. Both of these sensations will go away after the test is complete.  You may be asked to lie in different positions or place your extremity in different positions.  At the end of the procedure, you will be given extra IV fluids to help flush the contrast material from your veins.  When the IV tube is removed, pressure will be applied to prevent bleeding. A bandage may be applied. AFTER THE PROCEDURE   You will be checked frequently after the procedure. You may feel sleepy during this time if you were given a sedation medicine during the procedure.  You may be given something to eat and drink.  You will be instructed to drink a lot of fluids for the remainder of the day and into the next day, to help flush the contrast from your body.   This information is not intended to replace advice given to you by your health care provider. Make sure you discuss any questions you have with your health care provider.   Document Released: 02/18/2009 Document Revised: 03/23/2014 Document Reviewed: 11/07/2012 Elsevier Interactive Patient Education 2016 Elsevier Inc. Moderate Conscious Sedation, Adult Sedation is the use of medicines to promote relaxation and relieve discomfort and anxiety. Moderate conscious sedation is a type of sedation. Under moderate conscious sedation you are less alert than normal but are still able to respond to instructions or stimulation. Moderate conscious sedation is used during short medical and dental procedures. It is milder than deep sedation or general anesthesia and allows you to return to your regular  activities sooner. LET [facility] KNOW ABOUT:   Any allergies you have.  All medicines you are taking, including vitamins, herbs, eye drops, creams, and over-the-counter medicines.  Use of steroids (by mouth or creams).  Previous problems you or members of your family have had with the use of anesthetics.  Any blood disorders you have.  Previous surgeries you have had.  Medical conditions you have.  Possibility of pregnancy, if  this applies.  Use of cigarettes, alcohol, or illegal drugs. RISKS AND COMPLICATIONS Generally, this is a safe procedure. However, as with any procedure, problems can occur. Possible problems include:  Oversedation.  Trouble breathing on your own. You may need to have a breathing tube until you are awake and breathing on your own.  Allergic reaction to any of the medicines used for the procedure. BEFORE THE PROCEDURE  You may have blood tests done. These tests can help show how well your kidneys and liver are working. They can also show how well your blood clots.  A physical exam will be done.  Only take medicines as directed by your health care provider. You may need to stop taking medicines (such as blood thinners, aspirin, or nonsteroidal anti-inflammatory drugs) before the procedure.   Do not eat or drink at least 6 hours before the procedure or as directed by your health care provider.  Arrange for a responsible adult, family member, or friend to take you home after the procedure. He or she should stay with you for at least 24 hours after the procedure, until the medicine has worn off. PROCEDURE   An intravenous (IV) catheter will be inserted into one of your veins. Medicine will be able to flow directly into your body through this catheter. You may be given medicine through this tube to help prevent pain and help you relax.  The medical or dental procedure will be done. AFTER THE PROCEDURE  You will stay in a recovery area  until the medicine has worn off. Your blood pressure and pulse will be checked.   Depending on the procedure you had, you may be allowed to go home when you can tolerate liquids and your pain is under control.   This information is not intended to replace advice given to you by your health care provider. Make sure you discuss any questions you have with your health care provider.   Document Released: 11/25/2000 Document Revised: 03/23/2014 Document Reviewed: 11/07/2012 Elsevier Interactive Patient Education [employer]

## 2015-09-13 NOTE — Telephone Encounter (Signed)
Patient not available will try again Monday.

## 2015-09-13 NOTE — Telephone Encounter (Signed)
Ok, that's fine .Marland Kitchen. She will need BMET next week

## 2015-09-16 NOTE — Telephone Encounter (Signed)
Patient aware and she will come for labs.

## 2015-09-19 ENCOUNTER — Other Ambulatory Visit: Payer: Self-pay | Admitting: *Deleted

## 2015-09-19 MED ORDER — PREDNISONE 10 MG PO TABS: ORAL_TABLET | ORAL | Status: DC

## 2015-09-24 ENCOUNTER — Telehealth: Payer: Self-pay | Admitting: Gastroenterology

## 2015-09-24 NOTE — Telephone Encounter (Signed)
Pt was given folic acid and thiamine in the ER. States she needs refills on these if she needs to continue taking them. Please advise.

## 2015-09-24 NOTE — Telephone Encounter (Signed)
No I don't think so.  A regular multivitamin should be fine. Thanks

## 2015-09-25 NOTE — Telephone Encounter (Signed)
Spoke with pt and she is aware.

## 2015-09-27 ENCOUNTER — Telehealth: Payer: Self-pay | Admitting: Gastroenterology

## 2015-09-27 NOTE — Telephone Encounter (Signed)
She plans for her labs on Monday.

## 2015-09-30 ENCOUNTER — Other Ambulatory Visit (INDEPENDENT_AMBULATORY_CARE_PROVIDER_SITE_OTHER): Payer: Self-pay

## 2015-09-30 ENCOUNTER — Other Ambulatory Visit: Payer: Self-pay | Admitting: *Deleted

## 2015-09-30 DIAGNOSIS — K7460 Unspecified cirrhosis of liver: Secondary | ICD-10-CM

## 2015-09-30 DIAGNOSIS — K7290 Hepatic failure, unspecified without coma: Secondary | ICD-10-CM

## 2015-09-30 LAB — COMPREHENSIVE METABOLIC PANEL
ALT: 42 U/L — ABNORMAL HIGH (ref 0–35)
AST: 43 U/L — ABNORMAL HIGH (ref 0–37)
Albumin: 3.4 g/dL — ABNORMAL LOW (ref 3.5–5.2)
Alkaline Phosphatase: 63 U/L (ref 39–117)
BUN: 18 mg/dL (ref 6–23)
CO2: 29 mEq/L (ref 19–32)
Calcium: 9.7 mg/dL (ref 8.4–10.5)
Chloride: 99 mEq/L (ref 96–112)
Creatinine, Ser: 0.45 mg/dL (ref 0.40–1.20)
GFR: 152.49 mL/min (ref >60.00)
Glucose, Bld: 84 mg/dL (ref 70–99)
Potassium: 3.6 mEq/L (ref 3.5–5.1)
Sodium: 133 mEq/L — ABNORMAL LOW (ref 135–145)
Total Bilirubin: 1.6 mg/dL — ABNORMAL HIGH (ref 0.2–1.2)
Total Protein: 7.9 g/dL (ref 6.0–8.3)

## 2015-10-01 ENCOUNTER — Other Ambulatory Visit: Payer: Self-pay | Admitting: *Deleted

## 2015-10-01 DIAGNOSIS — K754 Autoimmune hepatitis: Secondary | ICD-10-CM

## 2015-10-04 LAB — THIOPURINE METHYLTRANSFERASE (TPMT), RBC: Thiopurine Methyltransferase, RBC: 15 nmol/hr/mL RBC

## 2015-10-10 ENCOUNTER — Other Ambulatory Visit: Payer: Self-pay | Admitting: Gastroenterology

## 2015-10-10 DIAGNOSIS — K754 Autoimmune hepatitis: Secondary | ICD-10-CM

## 2015-10-15 ENCOUNTER — Other Ambulatory Visit (INDEPENDENT_AMBULATORY_CARE_PROVIDER_SITE_OTHER): Payer: Self-pay

## 2015-10-15 DIAGNOSIS — K754 Autoimmune hepatitis: Secondary | ICD-10-CM

## 2015-10-15 LAB — COMPREHENSIVE METABOLIC PANEL
ALT: 54 U/L — ABNORMAL HIGH (ref 0–35)
AST: 54 U/L — ABNORMAL HIGH (ref 0–37)
Albumin: 3.7 g/dL (ref 3.5–5.2)
Alkaline Phosphatase: 79 U/L (ref 39–117)
BUN: 16 mg/dL (ref 6–23)
CO2: 31 mEq/L (ref 19–32)
Calcium: 10.2 mg/dL (ref 8.4–10.5)
Chloride: 97 mEq/L (ref 96–112)
Creatinine, Ser: 0.52 mg/dL (ref 0.40–1.20)
GFR: 129.03 mL/min (ref >60.00)
Glucose, Bld: 96 mg/dL (ref 70–99)
Potassium: 3.8 mEq/L (ref 3.5–5.1)
Sodium: 135 mEq/L (ref 135–145)
Total Bilirubin: 2 mg/dL — ABNORMAL HIGH (ref 0.2–1.2)
Total Protein: 8 g/dL (ref 6.0–8.3)

## 2015-10-16 LAB — IGG: IgG (Immunoglobin G), Serum: 1793 mg/dL — ABNORMAL HIGH (ref 694–1618)

## 2015-11-07 ENCOUNTER — Encounter: Payer: Self-pay | Admitting: Gastroenterology

## 2015-11-07 ENCOUNTER — Ambulatory Visit (INDEPENDENT_AMBULATORY_CARE_PROVIDER_SITE_OTHER): Payer: Self-pay | Admitting: Gastroenterology

## 2015-11-07 VITALS — BP 120/80 | HR 72 | Ht 62.5 in | Wt 92.4 lb

## 2015-11-07 DIAGNOSIS — R188 Other ascites: Secondary | ICD-10-CM

## 2015-11-07 DIAGNOSIS — K7469 Other cirrhosis of liver: Secondary | ICD-10-CM

## 2015-11-07 DIAGNOSIS — K754 Autoimmune hepatitis: Secondary | ICD-10-CM

## 2015-11-07 NOTE — Progress Notes (Signed)
HPI :  55 y/o female here for follow up for autoimmune hepatitis. She was hospitalized with ascites, anasarca, and jaundice a few months ago. Workup with imaging and labs revealed evidence of cirrhosis. She had a positive ANA, IgG, and smooth muscle AB.Patient had a transjugular liver biopsy c/w autoimmune hepatitis. She has elevated portal pressures on measurement, cirrhosis noted on biopsy along with active autoimmune hepatitis. She was treated with prednisone 60mg  daily x 1 week, then 40mg  once daily for one week, then 30mg  daily x 2 weeks, and then 20mg  thereafter. TPMT enzyme assay obtained and normal at 15.   She reports she is doing okay on the prednisone but it is effecting her sleep, and also having easy bruising. She is currently taking 20mg  per day. She reports significant improvement in her swelling. She reports significant improvement in her edema since her treatment began. She is taking lasix 20mg  and aldactone 25mg , taking every other day. No abdominal pains.   She has been seen a "holisitic provider" and looking into more natural therapies and asking about treating her liver disease with "essential oils" and "lavender". She states she has also had IV infusions of vitamin C.     Past Medical History:  Diagnosis Date  . Family history of adverse reaction to anesthesia    cousin had a difficult time waking up after appendectomy  . Heart murmur    "diagnosed with a very slight heart murmur in 2001 but no other doctor has been able to hear it"  . Raynaud's disease      Past Surgical History:  Procedure Laterality Date  . ABDOMINAL HYSTERECTOMY    . ORIF HUMERUS FRACTURE Right 05/23/2015   Procedure: OPEN REDUCTION INTERNAL FIXATION (ORIF) RIGHT  PROXIMAL HUMERUS FRACTURE;  Surgeon: Francena Hanly, MD;  Location: [facility] OR;  Service: Orthopedics;  Laterality: Right;   Family History  Problem Relation Age of Onset  . Dementia Father   . Parkinson's disease Mother   . Stroke Brother     Social History  Substance Use Topics  . Smoking status: Never Smoker  . Smokeless tobacco: Never Used  . Alcohol use 0.0 oz/week     Comment: occasional   Current Outpatient Prescriptions  Medication Sig Dispense Refill  . Cholecalciferol (VITAMIN D3) 10000 units TABS Take 1 tablet by mouth daily.    . feeding supplement, ENSURE ENLIVE, (ENSURE ENLIVE) LIQD Take 237 mLs by mouth 2 (two) times daily between meals. 237 mL 12  . furosemide (LASIX) 20 MG tablet Take 1 tablet (20 mg total) by mouth daily. 30 tablet 0  . magnesium gluconate (MAGONATE) 500 MG tablet Take 500 mg by mouth daily.    . Multiple Vitamin (MULTIVITAMIN WITH MINERALS) TABS tablet Take 1 tablet by mouth daily. 30 tablet 1  . predniSONE (DELTASONE) 10 MG tablet Take 60 mg po daily x 1 week then take 40 mg po daily x 1 week then take 30 mg daily x 2 weeks then take 20 mg daily thereafter 100 tablet 3  . spironolactone (ALDACTONE) 50 MG tablet Take 1/2 tablet ( 25 mg ) daily. 30 tablet 0   No current facility-administered medications for this visit.    Allergies  Allergen Reactions  . Oxycodone-Acetaminophen Nausea And Vomiting and Other (See Comments)    Hallucinations and bad dreams     Review of Systems: All systems reviewed and negative except where noted in HPI.    No results found.  Physical Exam: BP 120/80 (BP  Location: Left Arm, Patient Position: Sitting, Cuff Size: Small)   Pulse 72   Ht 5' 2.5" (1.588 m)   Wt 92 lb 6.4 oz (41.9 kg)   BMI 16.63 kg/m  Constitutional: Pleasant,well-developed, female in no acute distress. HEENT: Normocephalic and atraumatic. Conjunctivae are normal. No scleral icterus. Neck supple.  Cardiovascular: Normal rate, regular rhythm.  Pulmonary/chest: Effort normal and breath sounds normal. No wheezing, rales or rhonchi. Abdominal: Soft, mild distension, soft, nontender. Bowel sounds active throughout. There are no masses palpable.  Extremities: no  edema Lymphadenopathy: No cervical adenopathy noted. Neurological: Alert and oriented to person place and time. Skin: Skin is warm and dry. No rashes noted. Psychiatric: Normal mood and affect. Behavior is normal.   ASSESSMENT AND PLAN: 55 y/o female who presented relatively recently to the hospital with decompensated cirrhosis. Workup as outlined above has revealed a diagnosis of autoimmune hepatitis. She was started on prednisone monotherapy and diuretics and doing much better over the past few months, but having some side effects of prednisone. Her IgG level has some down since starting steroids.   I had a lengthy discussion with her about long management of autoimmune hepatitis and cirrhosis. Long term risks of cirrhosis include future decompensation or HCC, leading to potential need for liver transplant. My hope is that with treatment of underlying autoimmune hepatitis, we minimize the risk of this, and improve her condition. Her TPMT is normal. She is having some side effects of prednisone. I am recommend she start Azathioprine 50mg  daily and taper down prednisone. We discussed risks / benefits of these therapies. She had several questions about it and wishes for me to discuss her options with her holistic provider. She wishes to treat this condition with "essential oils".   I counseled her I am recommending first line therapies based on the guidelines put out by the AASLD, which are evidence based on this specific condition. I am not aware of any evidence at this time for essential oils providing benefit to autoimmune hepatitis and cirrhosis, but am happy to chat with her other provider with her about this. Given the stakes of progression of her disease if not treated appropriately, which could lead to liver transplantation, and the potential risks of therapies that have not been approved by the FDA, I counseled her I recommend the therapies put forth by the AASLD. She verbalized understanding but  wishes to think about it.   At this time recommend the following: - recommend starting Azathioprine 50mg /day and lowering prednisone to 10mg  daily. She wishes to think about it and will contact me next week. She will continue prednisone 20mg /day in the interim - recommend EGD for varices screening. Discussed risks / benefits she wishes to proceed. - continue present dosing of diuretics - HCC screening every 6 months, next due December 2017 - Twin rix vaccine - will discuss colon cancer screening with her, noticed she does not have any colonoscopy listed in the system after she left the office today  Ileene PatrickSteven Armbruster, MD [facility] Pager [phone]

## 2015-11-07 NOTE — Patient Instructions (Signed)

## 2015-11-08 ENCOUNTER — Other Ambulatory Visit: Payer: Self-pay

## 2015-11-11 ENCOUNTER — Other Ambulatory Visit: Payer: Self-pay | Admitting: Gastroenterology

## 2015-11-11 ENCOUNTER — Telehealth: Payer: Self-pay | Admitting: Gastroenterology

## 2015-11-11 DIAGNOSIS — K754 Autoimmune hepatitis: Secondary | ICD-10-CM

## 2015-11-11 NOTE — Telephone Encounter (Signed)
Called patient and she was not available to speak. I will call her back later today.

## 2015-11-11 NOTE — Telephone Encounter (Signed)
Called patient. I received office visit [facility] and reviewed the records. I discussed this with the patient, and my recommendations for her long term care. I offered her either prednisone monotherapy or Azathioprine at 50mg  / day + prednisone with goal of reducing dose of prednisone over time. I discussed risks / benefits of both regimens in detail.   She was being recommended IV vitamin C infusions for over $100 / infusion and other essential oils by Dr. Marcelino DusterBalthea, amongst some other regimens such as glutathione infusions. I am not aware of any evidence of these regimens for treatment of autoimmune hepatitis. Given the stakes of treatment failure for her include potential further decompensation / liver transplant, I recommended that she avoid alternative therapies at this time. Further, if she agrees to start Azathioprine which can elevate liver enzymes, and she takes other supplements as outlined, and her liver enzymes rise, it will make it difficult to determine what is causing that issue. We had a lengthy discussion about all of these issues and she wished to try Azathioprine at this time and will avoid other therapies.   I asked her to go to the lab to obtain CBC and LFTs, and will also obtain IgG level. Once we have this she will be contacted about starting Azathioprine at 50mg  daily. All questions answered.

## 2015-11-12 ENCOUNTER — Ambulatory Visit (INDEPENDENT_AMBULATORY_CARE_PROVIDER_SITE_OTHER): Payer: Self-pay | Admitting: Gastroenterology

## 2015-11-12 ENCOUNTER — Other Ambulatory Visit (INDEPENDENT_AMBULATORY_CARE_PROVIDER_SITE_OTHER): Payer: Self-pay

## 2015-11-12 DIAGNOSIS — K754 Autoimmune hepatitis: Secondary | ICD-10-CM

## 2015-11-12 DIAGNOSIS — Z23 Encounter for immunization: Secondary | ICD-10-CM

## 2015-11-12 LAB — COMPREHENSIVE METABOLIC PANEL
ALT: 40 U/L — ABNORMAL HIGH (ref 0–35)
AST: 43 U/L — ABNORMAL HIGH (ref 0–37)
Albumin: 3.6 g/dL (ref 3.5–5.2)
Alkaline Phosphatase: 72 U/L (ref 39–117)
BUN: 19 mg/dL (ref 6–23)
CO2: 33 mEq/L — ABNORMAL HIGH (ref 19–32)
Calcium: 9.5 mg/dL (ref 8.4–10.5)
Chloride: 101 mEq/L (ref 96–112)
Creatinine, Ser: 0.53 mg/dL (ref 0.40–1.20)
GFR: 126.19 mL/min (ref >60.00)
Glucose, Bld: 103 mg/dL — ABNORMAL HIGH (ref 70–99)
Potassium: 4.4 mEq/L (ref 3.5–5.1)
Sodium: 139 mEq/L (ref 135–145)
Total Bilirubin: 1.3 mg/dL — ABNORMAL HIGH (ref 0.2–1.2)
Total Protein: 7.1 g/dL (ref 6.0–8.3)

## 2015-11-12 LAB — CBC WITH DIFFERENTIAL/PLATELET
Basophils Absolute: 0.1 10*3/uL (ref 0.0–0.1)
Basophils Relative: 0.8 % (ref 0.0–3.0)
Eosinophils Absolute: 0.1 10*3/uL (ref 0.0–0.7)
Eosinophils Relative: 1.9 % (ref 0.0–5.0)
HCT: 42.1 % (ref 36.0–46.0)
Hemoglobin: 14.6 g/dL (ref 12.0–15.0)
Lymphocytes Relative: 37 % (ref 12.0–46.0)
Lymphs Abs: 2.7 10*3/uL (ref 0.7–4.0)
MCHC: 34.7 g/dL (ref 30.0–36.0)
MCV: 97 fl (ref 78.0–100.0)
Monocytes Absolute: 0.9 10*3/uL (ref 0.1–1.0)
Monocytes Relative: 12.4 % — ABNORMAL HIGH (ref 3.0–12.0)
Neutro Abs: 3.5 10*3/uL (ref 1.4–7.7)
Neutrophils Relative %: 47.9 % (ref 43.0–77.0)
Platelets: 103 10*3/uL — ABNORMAL LOW (ref 150.0–400.0)
RBC: 4.34 Mil/uL (ref 3.87–5.11)
RDW: 13.8 % (ref 11.5–15.5)
WBC: 7.2 10*3/uL (ref 4.0–10.5)

## 2015-11-13 LAB — IGG: IgG (Immunoglobin G), Serum: 1418 mg/dL (ref 694–1618)

## 2015-11-14 ENCOUNTER — Other Ambulatory Visit: Payer: Self-pay | Admitting: Physician Assistant

## 2015-11-14 ENCOUNTER — Encounter: Payer: Self-pay | Admitting: Gastroenterology

## 2015-11-14 ENCOUNTER — Ambulatory Visit (AMBULATORY_SURGERY_CENTER): Payer: Self-pay | Admitting: Gastroenterology

## 2015-11-14 VITALS — BP 113/76 | HR 75 | Temp 98.0°F | Resp 18 | Ht 62.5 in | Wt 92.0 lb

## 2015-11-14 DIAGNOSIS — K754 Autoimmune hepatitis: Secondary | ICD-10-CM

## 2015-11-14 DIAGNOSIS — I8500 Esophageal varices without bleeding: Secondary | ICD-10-CM

## 2015-11-14 MED ORDER — SODIUM CHLORIDE 0.9 % IV SOLN: 500.0000 mL | INTRAVENOUS | Status: DC

## 2015-11-14 MED ORDER — NADOLOL 20 MG PO TABS: 20.0000 mg | ORAL_TABLET | Freq: Every day | ORAL | 1 refills | Status: DC

## 2015-11-14 NOTE — Op Note (Signed)
[facility] Patient Name: Ellen Henriaula Taulbee Procedure Date: 11/14/2015 11:31 AM MRN: [id number] Endoscopist: Viviann SpareSteven P. Adela LankArmbruster , MD Age: 55 Referring MD:  Date of Birth: 06/19/1960 Gender: Female Account #: [id number] Procedure:                Upper GI endoscopy Indications:              history of autoimmune hepatitis and cirrhosis,                            screening for esophageal varices Medicines:                Monitored Anesthesia Care Procedure:                Pre-Anesthesia Assessment:                           - Prior to the procedure, a History and Physical                            was performed, and patient medications and                            allergies were reviewed. The patient's tolerance of                            previous anesthesia was also reviewed. The risks                            and benefits of the procedure and the sedation                            options and risks were discussed with the patient.                            All questions were answered, and informed consent                            was obtained. Prior Anticoagulants: The patient has                            taken no previous anticoagulant or antiplatelet                            agents. ASA Grade Assessment: II - A patient with                            mild systemic disease. After reviewing the risks                            and benefits, the patient was deemed in                            satisfactory condition to undergo the procedure.  After obtaining informed consent, the endoscope was                            passed under direct vision. Throughout the                            procedure, the patient's blood pressure, pulse, and                            oxygen saturations were monitored continuously. The                            Model GIF-HQ190 [id number]) scope was introduced                            through the  mouth, and advanced to the antrum of                            the stomach. The upper GI endoscopy was                            accomplished without difficulty. The patient                            tolerated the procedure well. Scope In: Scope Out: Findings:                 Esophagogastric landmarks were identified: the                            Z-line was found at 34 cm, the gastroesophageal                            junction was found at 34 cm and the upper extent of                            the gastric folds was found at 35 cm from the                            incisors.                           A small hiatal hernia was present.                           2 columns of small varices were found in the lower                            third of the esophagus, without stigmata of                            bleeding.                           The exam of the esophagus was  otherwise normal.                           A large amount of food (residue) was found in the                            gastric antrum and thus the pylorus and duodenum                            were not evaluated.                           The exam of the stomach was otherwise normal. No                            evidence of gastric varices. Complications:            No immediate complications. Estimated blood loss:                            None. Estimated Blood Loss:     Estimated blood loss: none. Impression:               - Esophagogastric landmarks identified.                           - Small hiatal hernia.                           - Small esophageal varices without any stigmata of                            bleeding.                           - A large amount of food (residue) in the stomach,                            antrum and duodenum not evaluated.                           - No gastric varices Recommendation:           - Patient has a contact number available for                             emergencies. The signs and symptoms of potential                            delayed complications were discussed with the                            patient. Return to normal activities tomorrow.                            Written discharge instructions were provided to the  patient.                           - Resume previous diet.                           - Continue present medications.                           - Start nadolol 20mg  daily (resting HR of 70                            currently), titrate up as tolerated                           - No further varices screening if patient is able                            to tolerate long term beta blocker Viviann Spare P. Armbruster, MD 11/14/2015 11:50:58 AM This report has been signed electronically.

## 2015-11-14 NOTE — Progress Notes (Signed)
Teeth unchanged after procedure.A and O x3. Report to RN. Tolerated MAC anesthesia well. 

## 2015-11-14 NOTE — Patient Instructions (Signed)
YOU HAD AN ENDOSCOPIC PROCEDURE TODAY AT THE [facility]:   Refer to the procedure report that was given to you for any specific questions about what was found during the examination.  If the procedure report does not answer your questions, please call your gastroenterologist to clarify.  If you requested that your care partner not be given the details of your procedure findings, then the procedure report has been included in a sealed envelope for you to review at your convenience later.  YOU SHOULD EXPECT: Some feelings of bloating in the abdomen. Passage of more gas than usual.  Walking can help get rid of the air that was put into your GI tract during the procedure and reduce the bloating.   Please Note:  You might notice some irritation and congestion in your nose or some drainage.  This is from the oxygen used during your procedure.  There is no need for concern and it should clear up in a day or so.  SYMPTOMS TO REPORT IMMEDIATELY:    Following upper endoscopy (EGD)  Vomiting of blood or coffee ground material  New chest pain or pain under the shoulder blades  Painful or persistently difficult swallowing  New shortness of breath  Fever of 100F or higher  Black, tarry-looking stools  For urgent or emergent issues, a gastroenterologist can be reached at any hour by calling [phone].   DIET:  We do recommend a small meal at first, but then you may proceed to your regular diet.  Drink plenty of fluids but you should avoid alcoholic beverages for 24 hours.  ACTIVITY:  You should plan to take it easy for the rest of today and you should NOT DRIVE or use heavy machinery until tomorrow (because of the sedation medicines used during the test).    FOLLOW UP: Our staff will call the number listed on your records the next business day following your procedure to check on you and address any questions or concerns that you may have regarding the information given to you  following your procedure. If we do not reach you, we will leave a message.  However, if you are feeling well and you are not experiencing any problems, there is no need to return our call.  We will assume that you have returned to your regular daily activities without incident.  If any biopsies were taken you will be contacted by phone or by letter within the next 1-3 weeks.  Please call us at [phone] if you have not heard about the biopsies in 3 weeks.    SIGNATURES/CONFIDENTIALITY: You and/or your care partner have signed paperwork which will be entered into your electronic medical record.  These signatures attest to the fact that that the information above on your After Visit Summary has been reviewed and is understood.  Full responsibility of the confidentiality of this discharge information lies with you and/or your care-partner.  Be sure to take your new medication today. Adjust as needed to keep your heart-rate at 70 per minute.

## 2015-11-15 ENCOUNTER — Telehealth: Payer: Self-pay | Admitting: *Deleted

## 2015-11-15 NOTE — Telephone Encounter (Signed)
  Follow up Call-  Call back number 11/14/2015  Post procedure Call Back phone  # #[phone]  Permission to leave phone message No  Some recent data might be hidden     Patient questions:  Do you have a fever, pain , or abdominal swelling? No. Pain Score  0 *  Have you tolerated food without any problems? Yes.    Have you been able to return to your normal activities? Yes.    Do you have any questions about your discharge instructions: Diet   No. Medications  No. Follow up visit  No.  Do you have questions or concerns about your Care? No.  Actions: * If pain score is 4 or above: No action needed, pain <4.

## 2015-11-22 ENCOUNTER — Other Ambulatory Visit: Payer: Self-pay

## 2015-11-22 DIAGNOSIS — K754 Autoimmune hepatitis: Secondary | ICD-10-CM

## 2015-11-22 MED ORDER — AZATHIOPRINE 50 MG PO TABS: 50.0000 mg | ORAL_TABLET | Freq: Every day | ORAL | 1 refills | Status: DC

## 2015-11-29 ENCOUNTER — Other Ambulatory Visit (INDEPENDENT_AMBULATORY_CARE_PROVIDER_SITE_OTHER): Payer: Self-pay

## 2015-11-29 DIAGNOSIS — K754 Autoimmune hepatitis: Secondary | ICD-10-CM

## 2015-11-29 LAB — HEPATIC FUNCTION PANEL
ALT: 44 U/L — ABNORMAL HIGH (ref 0–35)
AST: 44 U/L — ABNORMAL HIGH (ref 0–37)
Albumin: 3.6 g/dL (ref 3.5–5.2)
Alkaline Phosphatase: 64 U/L (ref 39–117)
Bilirubin, Direct: 0.4 mg/dL — ABNORMAL HIGH (ref 0.0–0.3)
Total Bilirubin: 1.6 mg/dL — ABNORMAL HIGH (ref 0.2–1.2)
Total Protein: 7.1 g/dL (ref 6.0–8.3)

## 2015-11-29 LAB — CBC WITH DIFFERENTIAL/PLATELET
Basophils Absolute: 0.1 10*3/uL (ref 0.0–0.1)
Basophils Relative: 0.8 % (ref 0.0–3.0)
Eosinophils Absolute: 0.1 10*3/uL (ref 0.0–0.7)
Eosinophils Relative: 1.9 % (ref 0.0–5.0)
HCT: 41.8 % (ref 36.0–46.0)
Hemoglobin: 14.5 g/dL (ref 12.0–15.0)
Lymphocytes Relative: 26.6 % (ref 12.0–46.0)
Lymphs Abs: 2 10*3/uL (ref 0.7–4.0)
MCHC: 34.6 g/dL (ref 30.0–36.0)
MCV: 96.1 fl (ref 78.0–100.0)
Monocytes Absolute: 0.7 10*3/uL (ref 0.1–1.0)
Monocytes Relative: 9.5 % (ref 3.0–12.0)
Neutro Abs: 4.6 10*3/uL (ref 1.4–7.7)
Neutrophils Relative %: 61.2 % (ref 43.0–77.0)
Platelets: 108 10*3/uL — ABNORMAL LOW (ref 150.0–400.0)
RBC: 4.35 Mil/uL (ref 3.87–5.11)
RDW: 13.8 % (ref 11.5–15.5)
WBC: 7.6 10*3/uL (ref 4.0–10.5)

## 2015-12-02 ENCOUNTER — Telehealth: Payer: Self-pay | Admitting: Gastroenterology

## 2015-12-02 DIAGNOSIS — K754 Autoimmune hepatitis: Secondary | ICD-10-CM

## 2015-12-02 NOTE — Telephone Encounter (Signed)
Patient notified of results on lab work from 11/29/15.  She verbalized understanding of new lab orders and to decrease prednisone.    Patient reports chest pain and sharp pressure since starting on Nadalol.  Please advise.

## 2015-12-03 NOTE — Telephone Encounter (Signed)
Discussed symptoms with Dr. Adela LankArmbruster. Patient to hold Nadolol for 2 weeks and call with an update on chest pain.  Left message for patient to call back

## 2015-12-03 NOTE — Telephone Encounter (Signed)
I started her on low dose nadolol after her endoscopy showing esophageal varices. She should have her vitals taken to ensure HR and BP are okay on this regimen if she is having symptoms.   Otherwise regarding her chest pain, does this feel like reflux to her, or something different? If she feels like she is having heartburn we can give zantac 150mg  BID and see if this helps. If she's having shortness of breath or other cardiac symptoms, she should see her primary care for this issue, and nadolol if she thinks it is related and see if she feels better.

## 2015-12-04 NOTE — Telephone Encounter (Signed)
Patient notified to hold nadolol.  She also reports alopecia.  Dr. Adela LankArmbruster notified of this also.  Possibly from nadolol or azathioprine.  Will get an update with next lab draw in 2 weeks.

## 2015-12-13 ENCOUNTER — Telehealth: Payer: Self-pay | Admitting: Gastroenterology

## 2015-12-13 ENCOUNTER — Other Ambulatory Visit: Payer: Self-pay

## 2015-12-13 DIAGNOSIS — K7469 Other cirrhosis of liver: Secondary | ICD-10-CM

## 2015-12-13 DIAGNOSIS — K754 Autoimmune hepatitis: Secondary | ICD-10-CM

## 2015-12-13 NOTE — Telephone Encounter (Signed)
Okay. If she did not tolerate the Imuran that's unfortunate. We will see if her symptoms improve while stopping it. If not, then something else is going on. I would obtain CBC and LFTs to ensure stable since she was on the Imuran and ensure no problems if you can order that and have her go to the lab. Thanks

## 2015-12-13 NOTE — Progress Notes (Signed)
Spoke to patient she understands to come by lab next week to have labs checked. She will let us know if her symptoms return.

## 2015-12-13 NOTE — Telephone Encounter (Signed)
Spoke to patient, she called to let Dr. Adela LankArmbruster know that she stopped taking her Imuran on Tuesday and went back to taking her prednisone 20 mg a day. She states that she was just so fatigued with the Imuran and finger nails were slitting down to the quick and bleeding that she could not function. She is questioning if she needs to have bloodwork done since she is not taking the medication. Please advise.

## 2015-12-16 ENCOUNTER — Ambulatory Visit (INDEPENDENT_AMBULATORY_CARE_PROVIDER_SITE_OTHER): Payer: Self-pay | Admitting: Gastroenterology

## 2015-12-16 ENCOUNTER — Other Ambulatory Visit (INDEPENDENT_AMBULATORY_CARE_PROVIDER_SITE_OTHER): Payer: Self-pay

## 2015-12-16 DIAGNOSIS — K7460 Unspecified cirrhosis of liver: Secondary | ICD-10-CM

## 2015-12-16 DIAGNOSIS — K754 Autoimmune hepatitis: Secondary | ICD-10-CM

## 2015-12-16 DIAGNOSIS — Z23 Encounter for immunization: Secondary | ICD-10-CM

## 2015-12-16 DIAGNOSIS — R188 Other ascites: Principal | ICD-10-CM

## 2015-12-16 LAB — HEPATIC FUNCTION PANEL
ALT: 51 U/L — ABNORMAL HIGH (ref 0–35)
AST: 49 U/L — ABNORMAL HIGH (ref 0–37)
Albumin: 3.5 g/dL (ref 3.5–5.2)
Alkaline Phosphatase: 74 U/L (ref 39–117)
Bilirubin, Direct: 0.3 mg/dL (ref 0.0–0.3)
Total Bilirubin: 1.1 mg/dL (ref 0.2–1.2)
Total Protein: 7.2 g/dL (ref 6.0–8.3)

## 2015-12-16 LAB — CBC WITH DIFFERENTIAL/PLATELET
Basophils Absolute: 0 10*3/uL (ref 0.0–0.1)
Basophils Relative: 0.3 % (ref 0.0–3.0)
Eosinophils Absolute: 0.2 10*3/uL (ref 0.0–0.7)
Eosinophils Relative: 2.5 % (ref 0.0–5.0)
HCT: 41 % (ref 36.0–46.0)
Hemoglobin: 14.2 g/dL (ref 12.0–15.0)
Lymphocytes Relative: 41.3 % (ref 12.0–46.0)
Lymphs Abs: 3.4 10*3/uL (ref 0.7–4.0)
MCHC: 34.6 g/dL (ref 30.0–36.0)
MCV: 94.5 fl (ref 78.0–100.0)
Monocytes Absolute: 0.8 10*3/uL (ref 0.1–1.0)
Monocytes Relative: 9.5 % (ref 3.0–12.0)
Neutro Abs: 3.8 10*3/uL (ref 1.4–7.7)
Neutrophils Relative %: 46.4 % (ref 43.0–77.0)
Platelets: 131 10*3/uL — ABNORMAL LOW (ref 150.0–400.0)
RBC: 4.34 Mil/uL (ref 3.87–5.11)
RDW: 14.7 % (ref 11.5–15.5)
WBC: 8.3 10*3/uL (ref 4.0–10.5)

## 2015-12-17 ENCOUNTER — Other Ambulatory Visit: Payer: Self-pay

## 2015-12-17 DIAGNOSIS — R7989 Other specified abnormal findings of blood chemistry: Secondary | ICD-10-CM

## 2015-12-17 DIAGNOSIS — R945 Abnormal results of liver function studies: Secondary | ICD-10-CM

## 2016-01-01 ENCOUNTER — Other Ambulatory Visit (INDEPENDENT_AMBULATORY_CARE_PROVIDER_SITE_OTHER): Payer: Self-pay

## 2016-01-01 DIAGNOSIS — R945 Abnormal results of liver function studies: Secondary | ICD-10-CM

## 2016-01-01 DIAGNOSIS — R7989 Other specified abnormal findings of blood chemistry: Secondary | ICD-10-CM

## 2016-01-01 LAB — HEPATIC FUNCTION PANEL
ALT: 57 U/L — ABNORMAL HIGH (ref 0–35)
AST: 47 U/L — ABNORMAL HIGH (ref 0–37)
Albumin: 3.9 g/dL (ref 3.5–5.2)
Alkaline Phosphatase: 83 U/L (ref 39–117)
Bilirubin, Direct: 0.3 mg/dL (ref 0.0–0.3)
Total Bilirubin: 1.5 mg/dL — ABNORMAL HIGH (ref 0.2–1.2)
Total Protein: 7.6 g/dL (ref 6.0–8.3)

## 2016-01-02 ENCOUNTER — Other Ambulatory Visit: Payer: Self-pay

## 2016-01-02 DIAGNOSIS — K754 Autoimmune hepatitis: Secondary | ICD-10-CM

## 2016-01-10 ENCOUNTER — Other Ambulatory Visit: Payer: Self-pay

## 2016-01-10 DIAGNOSIS — K754 Autoimmune hepatitis: Secondary | ICD-10-CM

## 2016-01-13 LAB — IGG: IgG (Immunoglobin G), Serum: 1328 mg/dL (ref 694–1618)

## 2016-01-14 ENCOUNTER — Other Ambulatory Visit: Payer: Self-pay

## 2016-01-14 DIAGNOSIS — K754 Autoimmune hepatitis: Secondary | ICD-10-CM

## 2016-03-02 ENCOUNTER — Other Ambulatory Visit (INDEPENDENT_AMBULATORY_CARE_PROVIDER_SITE_OTHER): Payer: Self-pay

## 2016-03-02 DIAGNOSIS — K7469 Other cirrhosis of liver: Secondary | ICD-10-CM

## 2016-03-02 DIAGNOSIS — K754 Autoimmune hepatitis: Secondary | ICD-10-CM

## 2016-03-02 LAB — CBC WITH DIFFERENTIAL/PLATELET
Basophils Absolute: 0 10*3/uL (ref 0.0–0.1)
Basophils Relative: 0.6 % (ref 0.0–3.0)
Eosinophils Absolute: 0.2 10*3/uL (ref 0.0–0.7)
Eosinophils Relative: 2 % (ref 0.0–5.0)
HCT: 43.3 % (ref 36.0–46.0)
Hemoglobin: 14.8 g/dL (ref 12.0–15.0)
Lymphocytes Relative: 10.9 % — ABNORMAL LOW (ref 12.0–46.0)
Lymphs Abs: 0.9 10*3/uL (ref 0.7–4.0)
MCHC: 34.1 g/dL (ref 30.0–36.0)
MCV: 92.4 fl (ref 78.0–100.0)
Monocytes Absolute: 0.7 10*3/uL (ref 0.1–1.0)
Monocytes Relative: 8.5 % (ref 3.0–12.0)
Neutro Abs: 6.4 10*3/uL (ref 1.4–7.7)
Neutrophils Relative %: 78 % — ABNORMAL HIGH (ref 43.0–77.0)
Platelets: 103 10*3/uL — ABNORMAL LOW (ref 150.0–400.0)
RBC: 4.69 Mil/uL (ref 3.87–5.11)
RDW: 13.7 % (ref 11.5–15.5)
WBC: 8.2 10*3/uL (ref 4.0–10.5)

## 2016-03-02 LAB — HEPATIC FUNCTION PANEL
ALT: 71 U/L — ABNORMAL HIGH (ref 0–35)
AST: 51 U/L — ABNORMAL HIGH (ref 0–37)
Albumin: 4.1 g/dL (ref 3.5–5.2)
Alkaline Phosphatase: 80 U/L (ref 39–117)
Bilirubin, Direct: 0.2 mg/dL (ref 0.0–0.3)
Total Bilirubin: 0.7 mg/dL (ref 0.2–1.2)
Total Protein: 7.3 g/dL (ref 6.0–8.3)

## 2016-03-03 LAB — IGG: IgG (Immunoglobin G), Serum: 1228 mg/dL (ref 694–1618)

## 2016-03-10 ENCOUNTER — Ambulatory Visit (INDEPENDENT_AMBULATORY_CARE_PROVIDER_SITE_OTHER)
Admission: RE | Admit: 2016-03-10 | Discharge: 2016-03-10 | Disposition: A | Discharge status: Home / Self Care | Payer: Self-pay | Source: Ambulatory Visit | Attending: Gastroenterology | Admitting: Gastroenterology

## 2016-03-10 ENCOUNTER — Encounter: Payer: Self-pay | Admitting: Gastroenterology

## 2016-03-10 ENCOUNTER — Other Ambulatory Visit (INDEPENDENT_AMBULATORY_CARE_PROVIDER_SITE_OTHER): Payer: Self-pay

## 2016-03-10 ENCOUNTER — Ambulatory Visit (INDEPENDENT_AMBULATORY_CARE_PROVIDER_SITE_OTHER): Payer: Self-pay | Admitting: Gastroenterology

## 2016-03-10 VITALS — BP 118/64 | HR 83 | Ht 62.5 in | Wt 99.5 lb

## 2016-03-10 DIAGNOSIS — K7469 Other cirrhosis of liver: Secondary | ICD-10-CM

## 2016-03-10 DIAGNOSIS — Z79899 Other long term (current) drug therapy: Secondary | ICD-10-CM

## 2016-03-10 DIAGNOSIS — K754 Autoimmune hepatitis: Secondary | ICD-10-CM

## 2016-03-10 DIAGNOSIS — K7460 Unspecified cirrhosis of liver: Secondary | ICD-10-CM

## 2016-03-10 DIAGNOSIS — I8500 Esophageal varices without bleeding: Secondary | ICD-10-CM

## 2016-03-10 DIAGNOSIS — Z1211 Encounter for screening for malignant neoplasm of colon: Secondary | ICD-10-CM

## 2016-03-10 LAB — HEMOGLOBIN A1C: Hgb A1c MFr Bld: 5.2 % (ref 4.6–6.5)

## 2016-03-10 LAB — VITAMIN D 25 HYDROXY (VIT D DEFICIENCY, FRACTURES): VITD: 110.66 ng/mL (ref 30.00–100.00)

## 2016-03-10 NOTE — Patient Instructions (Signed)
If you are age 665 or older, your body mass index should be between 23-30. Your Body mass index is 17.91 kg/m. If this is out of the aforementioned range listed, please consider follow up with your Primary Care Provider.  If you are age 57 or younger, your body mass index should be between 19-25. Your Body mass index is 17.91 kg/m. If this is out of the aformentioned range listed, please consider follow up with your Primary Care Provider.   Your physician has requested that you go to the basement for lab work before leaving today.  You have been [facility] of [facility]) on Thursday at 9:00am. Please arrive 15 minutes prior to your appointment for registration. Make certain not to have anything to eat or drink after midnight the night prior to your appointment. Should you need to reschedule your appointment, please contact radiology at [phone]. This test typically takes about 30 minutes to perform.  You have been scheduled for a bone density test. Please go to radiology on the basement floor of [facility] for this test. No preparation is necessary.  Thank you.

## 2016-03-10 NOTE — Progress Notes (Signed)
HPI :  55 y/o female here for follow up for autoimmune hepatitis. She was hospitalized in June 2017 with ascites, anasarca, and jaundice. Workup with imaging and labs revealed evidence of cirrhosis. She had a positive ANA, IgG, and smooth muscle AB.Patient had a transjugular liver biopsy c/w autoimmune hepatitis. She has elevated portal pressures on measurement, cirrhosis noted on biopsy along with active autoimmune hepatitis. She was treated with prednisone 60mg  daily x 1 week, then 40mg  once daily for one week, then 30mg  daily x 2 weeks, and then 20mg  thereafter. TPMT enzyme assay obtained and normal at 15.   Since her last visit she underwent an upper endoscopy on August 31, this showed small esophageal varices. She was started on Nadolol 20 mg once a day. She states on this regimen she felt as if "my chest was going to explode", and fatigue, and did not like the way she felt and stopped it. After stopping this medication her symptoms resolved.  We otherwise started her on Azathioprine 50mg  daily. Gave hr Twinrix vaccine. She did not tolerate Azathioprine, had alopecia and felt fatigued. She was on this for about a week or 2 before stopping it. She since been on prednisone 20 mg once daily. Regards to her labs her total IgG count has come down nicely with therapy. Her ALT is mildly elevated into the 70s up from 50s when last checked.  She has also been seen by a chiropracter in the interim. She denies any back pain but is getting manipulation therapy by chiropracter to "help with autoimmune hepatitis". She is also taking essential oils and multiple vitamin supplements    She is irritable. She thinks her weight in her abdomen, she thinks about a few lbs. She is taking calcium and vitamin D supplement. She is also taking milk thistle.   She also asked about essential oil.   Colonoscopy, none prior. No blood in the stools. Occasional constipation. No FH of colon cancer.     Past Medical  History:  Diagnosis Date  . Autoimmune hepatitis (HCC)   . Cirrhosis (HCC)   . Esophageal varices (HCC)   . Family history of adverse reaction to anesthesia    cousin had a difficult time waking up after appendectomy  . Heart murmur    "diagnosed with a very slight heart murmur in 2001 but no other doctor has been able to hear it"  . Hyperlipidemia   . Raynaud's disease      Past Surgical History:  Procedure Laterality Date  . ABDOMINAL HYSTERECTOMY    . ORIF HUMERUS FRACTURE Right 05/23/2015   Procedure: OPEN REDUCTION INTERNAL FIXATION (ORIF) RIGHT  PROXIMAL HUMERUS FRACTURE;  Surgeon: Francena Hanly, MD;  Location: [facility] OR;  Service: Orthopedics;  Laterality: Right;  . WISDOM TOOTH EXTRACTION     Family History  Problem Relation Age of Onset  . Dementia Father   . Parkinson's disease Mother   . Stroke Brother   . Colon cancer Neg Hx   . Esophageal cancer Neg Hx   . Pancreatic cancer Neg Hx   . Rectal cancer Neg Hx   . Stomach cancer Neg Hx    Social History  Substance Use Topics  . Smoking status: Never Smoker  . Smokeless tobacco: Never Used  . Alcohol use 0.0 oz/week     Comment: occasional   Current Outpatient Prescriptions  Medication Sig Dispense Refill  . AMBULATORY NON FORMULARY MEDICATION Medication Name: Optimal Omega daily    . Cholecalciferol (VITAMIN  D3) 10000 units TABS Take 1 tablet by mouth daily.    . magnesium gluconate (MAGONATE) 500 MG tablet Take 500 mg by mouth daily.    Marland Kitchen. MILK THISTLE EXTRACT PO Take 350 mg by mouth daily.    . Multiple Vitamin (MULTIVITAMIN WITH MINERALS) TABS tablet Take 1 tablet by mouth daily. 30 tablet 1  . predniSONE (DELTASONE) 10 MG tablet Take 60 mg po daily x 1 week then take 40 mg po daily x 1 week then take 30 mg daily x 2 weeks then take 20 mg daily thereafter (Patient taking differently: Take 20 mg by mouth daily. Take 60 mg po daily x 1 week then take 40 mg po daily x 1 week then take 30 mg daily x 2 weeks then take 20  mg daily thereafter) 100 tablet 3  . TURMERIC PO Take by mouth.     No current facility-administered medications for this visit.    Allergies  Allergen Reactions  . Oxycodone-Acetaminophen Nausea And Vomiting and Other (See Comments)    Hallucinations and bad dreams     Review of Systems: All systems reviewed and negative except where noted in HPI.   Lab Results  Component Value Date   WBC 8.2 03/02/2016   HGB 14.8 03/02/2016   HCT 43.3 03/02/2016   MCV 92.4 03/02/2016   PLT 103.0 (L) 03/02/2016    Lab Results  Component Value Date   INR 1.43 09/13/2015   INR 1.5 (H) 09/09/2015   INR 1.6 (H) 09/04/2015    Lab Results  Component Value Date   ALT 71 (H) 03/02/2016   AST 51 (H) 03/02/2016   ALKPHOS 80 03/02/2016   BILITOT 0.7 03/02/2016    Lab Results  Component Value Date   CREATININE 0.53 11/12/2015   BUN 19 11/12/2015   NA 139 11/12/2015   K 4.4 11/12/2015   CL 101 11/12/2015   CO2 33 (H) 11/12/2015     Physical Exam: BP 118/64   Pulse 83   Ht 5' 2.5" (1.588 m)   Wt 99 lb 8 oz (45.1 kg)   BMI 17.91 kg/m  Constitutional: Pleasant, thin female in no acute distress. HEENT: Normocephalic and atraumatic. Conjunctivae are normal. No scleral icterus. Neck supple.  Cardiovascular: Normal rate, regular rhythm.  Pulmonary/chest: Effort normal and breath sounds normal. No wheezing, rales or rhonchi. Abdominal: Soft, nondistended, nontender. There are no masses palpable. No hepatomegaly. Extremities: no edema Lymphadenopathy: No cervical adenopathy noted. Neurological: Alert and oriented to person place and time. No asterixis. Skin: Skin is warm and dry. No rashes noted. Psychiatric: Normal mood and affect. Behavior is normal.   ASSESSMENT AND PLAN: 55 year old female who initially presented in June 2017 to the hospital with decompensated cirrhosis. Workup at that time including transjugular liver biopsy revealed diagnosis of autoimmune hepatitis with active  inflammation. She's done quite well on prednisone monotherapy with normalization in total IgG level. Her ascites has resolved and no longer on diuretics. She does have esophageal varices however was intolerant to Nadolol. She unfortunately did not tolerate a trial of azathioprine. On prednisone monotherapy she has developed some mood changes and concerned about long-term risks of steroids.  Moving forward, overall she is much improved, although ALT is slightly higher than previous. I wonder if her steroid use has potentially caused fatty liver driving this process, as worsening of autoimmune hepatitis seems less likely with normalization of IgG. Unfortunately she is not willing to try thiopurines again after her previous reaction. Given  her history of decompensated cirrhosis with ascites and varices, and rising ALT on steroids, I'm going to refer her to hepatologist regarding long-term management - can we decrease dose of steroids? Does she need a liver biopsy if ALT continues to rise? This will be useful to have her plugging with hepatology in case she worsens over time given her young age. She will continue 20 mg of prednisone daily until this visit.  Otherwise regarding treatment of varices I offered her a trial of low-dose propranolol which she declined. I'm going to obtain an ultrasound with AFP level as [facility]. I'm also going to screen her for diabetes and check vitamin D level. I will also refer her for DEXA scan to assess for osteoporosis. I'll await her results and hepatology consult and see her back in clinic in 3 months.  Of note she had questions about using chiropractor to treat autoimmune hepatitis as well as essential oils. If she has back pain and needs manipulation therapy for back with a chiropractor that's OK; however, I counseled her I do not feel that using spinal manipulation to treat autoimmune hepatitis and going to provide any benefit, and this does have  considerable cost. I'm also not aware that essential oils will help treat her autoimmune hepatitis and would recommend against it at this time.  Regarding her colon cancer screening, she is overdue for screening. She has no anemia or alarm symptoms otherwise. She wishes to avoid anesthesia if at all possible and in this light prefers stool testing for her screening. We'll start with stool FIT test. If negative she will continue this yearly, if positive she agrees with colonoscopy.  Ileene PatrickSteven Armbruster, MD [facility] Pager [phone]

## 2016-03-11 LAB — AFP TUMOR MARKER: AFP-Tumor Marker: 6.5 ng/mL — ABNORMAL HIGH

## 2016-03-12 ENCOUNTER — Ambulatory Visit (HOSPITAL_COMMUNITY)
Admission: RE | Admit: 2016-03-12 | Discharge: 2016-03-12 | Disposition: A | Discharge status: Home / Self Care | Payer: Self-pay | Source: Ambulatory Visit | Attending: Gastroenterology | Admitting: Gastroenterology

## 2016-03-12 DIAGNOSIS — Z1211 Encounter for screening for malignant neoplasm of colon: Secondary | ICD-10-CM | POA: Insufficient documentation

## 2016-03-12 DIAGNOSIS — K754 Autoimmune hepatitis: Secondary | ICD-10-CM | POA: Insufficient documentation

## 2016-03-12 DIAGNOSIS — I8500 Esophageal varices without bleeding: Secondary | ICD-10-CM

## 2016-03-12 DIAGNOSIS — K7469 Other cirrhosis of liver: Secondary | ICD-10-CM | POA: Insufficient documentation

## 2016-03-12 DIAGNOSIS — K8020 Calculus of gallbladder without cholecystitis without obstruction: Secondary | ICD-10-CM | POA: Insufficient documentation

## 2016-03-17 ENCOUNTER — Encounter: Payer: Self-pay | Admitting: Gastroenterology

## 2016-03-17 ENCOUNTER — Other Ambulatory Visit: Payer: Self-pay | Admitting: Gastroenterology

## 2016-03-17 ENCOUNTER — Other Ambulatory Visit: Payer: Self-pay

## 2016-03-17 MED ORDER — PREDNISONE 10 MG PO TABS: 20.0000 mg | ORAL_TABLET | Freq: Every day | ORAL | 3 refills | Status: DC

## 2016-03-17 NOTE — Telephone Encounter (Signed)
Michelle FanningJulie can you please ensure this is refill but the script needs to be changed to 20mg  daily. Can give 20mg  tablets prednisone, #674m RF3. Thanks

## 2016-03-17 NOTE — Telephone Encounter (Signed)
Dr Adela LankArmbruster, Are you ok with giving refill of Prednisone?

## 2016-03-17 NOTE — Telephone Encounter (Signed)
Prescription changed to new dosage of 20 mg po daily, #60 with refills #3. Discontinued other order, sent new Rx to patients pharmacy.

## 2016-03-18 NOTE — Progress Notes (Signed)
Letter mailed

## 2016-03-20 ENCOUNTER — Telehealth: Payer: Self-pay | Admitting: Gastroenterology

## 2016-03-23 NOTE — Telephone Encounter (Signed)
Thanks for letting me know. If they cannot see her then I would recommend hepat[facility] or [facility] if you can help coordinate. Thanks very much

## 2016-03-23 NOTE — Telephone Encounter (Signed)
Dr Adela LankArmbruster, Do you know of another office you would like to send this pt to?

## 2016-03-24 NOTE — Telephone Encounter (Signed)
[facility]. [facility] will review notes and contact with an appt. Unsure at this point if they will take pt without ins. Informed on cover sheet that pt has [facility]. Left message informing pt. (per [facility] does not participate with [facility])

## 2016-05-18 ENCOUNTER — Ambulatory Visit (INDEPENDENT_AMBULATORY_CARE_PROVIDER_SITE_OTHER): Payer: Self-pay | Admitting: Gastroenterology

## 2016-05-18 ENCOUNTER — Encounter: Payer: Self-pay | Admitting: Gastroenterology

## 2016-05-18 DIAGNOSIS — K754 Autoimmune hepatitis: Secondary | ICD-10-CM

## 2016-05-18 DIAGNOSIS — Z23 Encounter for immunization: Secondary | ICD-10-CM

## 2016-05-18 DIAGNOSIS — K7469 Other cirrhosis of liver: Secondary | ICD-10-CM

## 2016-05-18 NOTE — Progress Notes (Signed)
Patient seen for final twinrix injection. Patient tolerated well.

## 2016-08-12 ENCOUNTER — Telehealth: Payer: Self-pay | Admitting: Gastroenterology

## 2016-08-12 NOTE — Telephone Encounter (Signed)
Yes she needs an US scheduled in June [facility].  If she wants labs done now that's fine - CBC, CMET, INR  Of note, I referred her to see Hepatology in December but did not see any records. Can you help obtain those so I can review them? Thanks

## 2016-08-12 NOTE — Telephone Encounter (Signed)
From what I can see in her chart, she is due for repeat US and labs in June. Please advise what you would like ordered.

## 2016-08-13 ENCOUNTER — Telehealth: Payer: Self-pay | Admitting: Gastroenterology

## 2016-08-13 ENCOUNTER — Other Ambulatory Visit: Payer: Self-pay

## 2016-08-13 DIAGNOSIS — R188 Other ascites: Secondary | ICD-10-CM

## 2016-08-13 DIAGNOSIS — K7460 Unspecified cirrhosis of liver: Secondary | ICD-10-CM

## 2016-08-13 DIAGNOSIS — K754 Autoimmune hepatitis: Secondary | ICD-10-CM

## 2016-08-13 NOTE — Telephone Encounter (Signed)
I do not see a referral in place for hepatology. I will put one in for that, this will be due to increasing ALT and use of steroids? Thanks.

## 2016-08-13 NOTE — Telephone Encounter (Signed)
I found in Jan. 2018 that Morrie Sheldonshley tried to send [facility] but she did not have insurance. [facility], the patient states that they called her but she wanted to wait until she had insurance to see them. I see where [facility] requested copy of liver biopsy and labs, I am not sure they received them so will fax them again and let them know that we will send new ones when completed. She now has insurance and will call them. She is will bring her insurance card here so we can make a copy of card and update our information.  Patient wanted to let you know she is feeling good, she has slowly weaned herself off of prednisone. Last dose was 06/02/16 at 5 mg. She started doing this in January decreasing by 5 mg monthly.

## 2016-08-13 NOTE — Telephone Encounter (Signed)
Left message for patient to return my call.

## 2016-08-13 NOTE — Telephone Encounter (Signed)
Okay thanks for the update. I wish she had kept in touch with us during this time, I was not aware of this issue and had thought she had been following up with Hepatology..  She should not have weaned herself off steroids without monitoring.  We have ordered labs - CBC, CMET, INR, if you can ask her to go the lab. Could you also add on a IgG level as well? Hopefully she can go to the lab as soon as possible. Thanks much

## 2016-08-13 NOTE — Telephone Encounter (Signed)
I had recommended a referral to Hepatology in December for another opinion regarding long term management. [facility]ane Police[facility]amor[facility]. If she has not followed through with it, or if never scheduled, I would like her set up for an evaluation with them. Otherwise I will await her labs and US results and contact her. She should be on prednisone otherwise, if you can verify her dose. Thanks

## 2016-08-14 ENCOUNTER — Other Ambulatory Visit (INDEPENDENT_AMBULATORY_CARE_PROVIDER_SITE_OTHER): Payer: PRIVATE HEALTH INSURANCE

## 2016-08-14 DIAGNOSIS — K754 Autoimmune hepatitis: Secondary | ICD-10-CM

## 2016-08-14 DIAGNOSIS — R188 Other ascites: Secondary | ICD-10-CM

## 2016-08-14 DIAGNOSIS — K7460 Unspecified cirrhosis of liver: Secondary | ICD-10-CM

## 2016-08-14 LAB — COMPREHENSIVE METABOLIC PANEL
ALT: 20 U/L (ref 0–35)
AST: 31 U/L (ref 0–37)
Albumin: 4.1 g/dL (ref 3.5–5.2)
Alkaline Phosphatase: 134 U/L — ABNORMAL HIGH (ref 39–117)
BUN: 14 mg/dL (ref 6–23)
CO2: 29 mEq/L (ref 19–32)
Calcium: 9.8 mg/dL (ref 8.4–10.5)
Chloride: 107 mEq/L (ref 96–112)
Creatinine, Ser: 0.5 mg/dL (ref 0.40–1.20)
GFR: 134.61 mL/min (ref >60.00)
Glucose, Bld: 96 mg/dL (ref 70–99)
Potassium: 4.3 mEq/L (ref 3.5–5.1)
Sodium: 141 mEq/L (ref 135–145)
Total Bilirubin: 0.8 mg/dL (ref 0.2–1.2)
Total Protein: 7 g/dL (ref 6.0–8.3)

## 2016-08-14 LAB — PROTIME-INR
INR: 1.3 ratio — ABNORMAL HIGH (ref 0.8–1.0)
Prothrombin Time: 13.8 s — ABNORMAL HIGH (ref 9.6–13.1)

## 2016-08-14 LAB — CBC WITH DIFFERENTIAL/PLATELET
Basophils Absolute: 0 10*3/uL (ref 0.0–0.1)
Basophils Relative: 0.4 % (ref 0.0–3.0)
Eosinophils Absolute: 0.2 10*3/uL (ref 0.0–0.7)
Eosinophils Relative: 5.2 % — ABNORMAL HIGH (ref 0.0–5.0)
HCT: 38.3 % (ref 36.0–46.0)
Hemoglobin: 13.1 g/dL (ref 12.0–15.0)
Lymphocytes Relative: 43.8 % (ref 12.0–46.0)
Lymphs Abs: 2.1 10*3/uL (ref 0.7–4.0)
MCHC: 34.1 g/dL (ref 30.0–36.0)
MCV: 92.9 fl (ref 78.0–100.0)
Monocytes Absolute: 0.6 10*3/uL (ref 0.1–1.0)
Monocytes Relative: 12 % (ref 3.0–12.0)
Neutro Abs: 1.9 10*3/uL (ref 1.4–7.7)
Neutrophils Relative %: 38.6 % — ABNORMAL LOW (ref 43.0–77.0)
Platelets: 85 10*3/uL — ABNORMAL LOW (ref 150.0–400.0)
RBC: 4.12 Mil/uL (ref 3.87–5.11)
RDW: 13.8 % (ref 11.5–15.5)
WBC: 4.8 10*3/uL (ref 4.0–10.5)

## 2016-08-17 LAB — IGG: IgG (Immunoglobin G), Serum: 1164 mg/dL (ref 694–1618)

## 2016-08-18 ENCOUNTER — Other Ambulatory Visit: Payer: Self-pay

## 2016-08-26 ENCOUNTER — Ambulatory Visit (HOSPITAL_COMMUNITY)
Admission: RE | Admit: 2016-08-26 | Discharge: 2016-08-26 | Disposition: A | Discharge status: Home / Self Care | Payer: PRIVATE HEALTH INSURANCE | Source: Ambulatory Visit | Attending: Gastroenterology | Admitting: Gastroenterology

## 2016-08-26 DIAGNOSIS — K754 Autoimmune hepatitis: Secondary | ICD-10-CM | POA: Insufficient documentation

## 2016-08-26 DIAGNOSIS — K7460 Unspecified cirrhosis of liver: Secondary | ICD-10-CM

## 2016-08-26 DIAGNOSIS — R188 Other ascites: Secondary | ICD-10-CM

## 2016-08-26 DIAGNOSIS — K8020 Calculus of gallbladder without cholecystitis without obstruction: Secondary | ICD-10-CM | POA: Insufficient documentation

## 2017-02-15 ENCOUNTER — Other Ambulatory Visit: Payer: Self-pay

## 2017-02-15 DIAGNOSIS — K7460 Unspecified cirrhosis of liver: Secondary | ICD-10-CM

## 2017-02-15 DIAGNOSIS — R188 Other ascites: Principal | ICD-10-CM

## 2017-02-26 ENCOUNTER — Telehealth: Payer: Self-pay | Admitting: Gastroenterology

## 2017-02-26 NOTE — Telephone Encounter (Signed)
A user error has taken place: ERROR °

## 2017-03-01 ENCOUNTER — Ambulatory Visit (HOSPITAL_COMMUNITY)
Admission: RE | Admit: 2017-03-01 | Discharge: 2017-03-01 | Disposition: A | Discharge status: Home / Self Care | Payer: No Typology Code available for payment source | Source: Ambulatory Visit | Attending: Gastroenterology | Admitting: Gastroenterology

## 2017-03-01 DIAGNOSIS — R188 Other ascites: Secondary | ICD-10-CM | POA: Insufficient documentation

## 2017-03-01 DIAGNOSIS — K7460 Unspecified cirrhosis of liver: Secondary | ICD-10-CM | POA: Diagnosis present

## 2017-03-01 DIAGNOSIS — K8020 Calculus of gallbladder without cholecystitis without obstruction: Secondary | ICD-10-CM | POA: Diagnosis not present

## 2017-03-04 ENCOUNTER — Other Ambulatory Visit: Payer: Self-pay

## 2017-03-04 DIAGNOSIS — K7460 Unspecified cirrhosis of liver: Secondary | ICD-10-CM

## 2017-03-04 DIAGNOSIS — R188 Other ascites: Principal | ICD-10-CM

## 2017-04-13 ENCOUNTER — Telehealth: Payer: Self-pay

## 2017-04-13 DIAGNOSIS — K754 Autoimmune hepatitis: Secondary | ICD-10-CM

## 2017-04-13 NOTE — Telephone Encounter (Signed)
Called and spoke to pt. She has not had labs done .I added the IgG Dr. Mervyn SkeetersA mentioned in his last note on 08-12-16 . Pt said she will go to the lab prior to coming in for her appt on 04-26-17.

## 2017-04-22 ENCOUNTER — Other Ambulatory Visit (INDEPENDENT_AMBULATORY_CARE_PROVIDER_SITE_OTHER): Payer: PRIVATE HEALTH INSURANCE

## 2017-04-22 DIAGNOSIS — K754 Autoimmune hepatitis: Secondary | ICD-10-CM

## 2017-04-22 DIAGNOSIS — K7460 Unspecified cirrhosis of liver: Secondary | ICD-10-CM | POA: Diagnosis not present

## 2017-04-22 DIAGNOSIS — R188 Other ascites: Secondary | ICD-10-CM | POA: Diagnosis not present

## 2017-04-22 LAB — COMPREHENSIVE METABOLIC PANEL
ALT: 28 U/L (ref 0–35)
AST: 36 U/L (ref 0–37)
Albumin: 4.4 g/dL (ref 3.5–5.2)
Alkaline Phosphatase: 99 U/L (ref 39–117)
BUN: 12 mg/dL (ref 6–23)
CO2: 29 mEq/L (ref 19–32)
Calcium: 10.1 mg/dL (ref 8.4–10.5)
Chloride: 103 mEq/L (ref 96–112)
Creatinine, Ser: 0.59 mg/dL (ref 0.40–1.20)
GFR: 110.94 mL/min (ref >60.00)
Glucose, Bld: 99 mg/dL (ref 70–99)
Potassium: 3.5 mEq/L (ref 3.5–5.1)
Sodium: 139 mEq/L (ref 135–145)
Total Bilirubin: 0.8 mg/dL (ref 0.2–1.2)
Total Protein: 8.2 g/dL (ref 6.0–8.3)

## 2017-04-22 LAB — CBC WITH DIFFERENTIAL/PLATELET
Basophils Absolute: 0.1 10*3/uL (ref 0.0–0.1)
Basophils Relative: 1.4 % (ref 0.0–3.0)
Eosinophils Absolute: 0.2 10*3/uL (ref 0.0–0.7)
Eosinophils Relative: 4.1 % (ref 0.0–5.0)
HCT: 39.9 % (ref 36.0–46.0)
Hemoglobin: 13.7 g/dL (ref 12.0–15.0)
Lymphocytes Relative: 44.3 % (ref 12.0–46.0)
Lymphs Abs: 2.1 10*3/uL (ref 0.7–4.0)
MCHC: 34.4 g/dL (ref 30.0–36.0)
MCV: 91 fl (ref 78.0–100.0)
Monocytes Absolute: 0.4 10*3/uL (ref 0.1–1.0)
Monocytes Relative: 9.4 % (ref 3.0–12.0)
Neutro Abs: 1.9 10*3/uL (ref 1.4–7.7)
Neutrophils Relative %: 40.8 % — ABNORMAL LOW (ref 43.0–77.0)
Platelets: 94 10*3/uL — ABNORMAL LOW (ref 150.0–400.0)
RBC: 4.39 Mil/uL (ref 3.87–5.11)
RDW: 13.5 % (ref 11.5–15.5)
WBC: 4.7 10*3/uL (ref 4.0–10.5)

## 2017-04-22 LAB — PROTIME-INR
INR: 1.3 ratio — ABNORMAL HIGH (ref 0.8–1.0)
Prothrombin Time: 13.8 s — ABNORMAL HIGH (ref 9.6–13.1)

## 2017-04-23 LAB — IGG: IgG (Immunoglobin G), Serum: 1565 mg/dL (ref 694–1618)

## 2017-04-26 ENCOUNTER — Encounter: Payer: Self-pay | Admitting: Gastroenterology

## 2017-04-26 ENCOUNTER — Other Ambulatory Visit: Payer: PRIVATE HEALTH INSURANCE

## 2017-04-26 ENCOUNTER — Ambulatory Visit: Payer: PRIVATE HEALTH INSURANCE | Admitting: Gastroenterology

## 2017-04-26 VITALS — BP 92/60 | HR 62 | Ht 62.0 in | Wt 97.4 lb

## 2017-04-26 DIAGNOSIS — K754 Autoimmune hepatitis: Secondary | ICD-10-CM

## 2017-04-26 DIAGNOSIS — K7469 Other cirrhosis of liver: Secondary | ICD-10-CM

## 2017-04-26 DIAGNOSIS — Z1211 Encounter for screening for malignant neoplasm of colon: Secondary | ICD-10-CM

## 2017-04-26 NOTE — Progress Notes (Signed)
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   She was hospitalized in June 2017 with ascites, anasarca, and jaundice. Workup with imaging and labs revealed evidence of cirrhosis which was a new diagnosis for her. She had a positive ANA, IgG, and smooth muscle AB. Patient had a transjugular liver biopsy c/w autoimmune hepatitis. She had elevated portal pressures on measurement, cirrhosis noted on biopsy along with active autoimmune hepatitis. She was treated with prednisone initially. TPMT enzyme assay normal at 15.   She underwent an upper endoscopy on November 14 2015, this showed small esophageal varices. She was started on Nadolol 20 mg and did not tolerate it. She did not tolerate propranolol.  We otherwise started her on Azathioprine 50mg  daily as we weaned prednisone. Gave her Twinrix vaccine. She did not tolerate Azathioprine, had alopecia and felt fatigued.   She had a nice response to prednisone and her labs improved.  I referred her to see hepatologist at that time regarding long-term management but she never followed through this due to insurance issue.  I am not seeing her in clinic for over a year, she has not followed up with me.  She reports she self tapered off prednisone last year and was off it completely as of February March 2018.  She states she has been seeing a chiropractor for alignment of her spine which she feels is possibly related to her autoimmune hepatitis.  She states she is also taking "essential oils".   She has not had any jaundice, edema, ascites.  Her weight is stable.  She is eating quite healthy.  She denies any problems with her bowel habits.  She denies any blood in her stools.  She was previously referred for stool test for colon cancer screening as she has declined colonoscopy in the past. She otherwise states she feels well without any complaints today.  EGD 11/14/2015 - small varices [place] - 03/01/2017 - cirrhotic  changes without mass lesions  Past Medical History:  Diagnosis Date  . Autoimmune hepatitis (HCC)   . Cirrhosis (HCC)   . Esophageal varices (HCC)   . Family history of adverse reaction to anesthesia    cousin had a difficult time waking up after appendectomy  . Heart murmur    "diagnosed with a very slight heart murmur in 2001 but no other doctor has been able to hear it"  . Hyperlipidemia   . Raynaud's disease      Past Surgical History:  Procedure Laterality Date  . ABDOMINAL HYSTERECTOMY    . ORIF HUMERUS FRACTURE Right 05/23/2015   Procedure: OPEN REDUCTION INTERNAL FIXATION (ORIF) RIGHT  PROXIMAL HUMERUS FRACTURE;  Surgeon: Francena Hanly, MD;  Location: [facility] OR;  Service: Orthopedics;  Laterality: Right;  . WISDOM TOOTH EXTRACTION     Family History  Problem Relation Age of Onset  . Dementia Father   . Parkinson's disease Mother   . Stroke Brother   . Colon cancer Neg Hx   . Esophageal cancer Neg Hx   . Pancreatic cancer Neg Hx   . Rectal cancer Neg Hx   . Stomach cancer Neg Hx    Social History   Tobacco Use  . Smoking status: Never Smoker  . Smokeless tobacco: Never Used  Substance Use Topics  . Alcohol use: Yes    Alcohol/week: 0.0 oz    Comment: occasional  . Drug use: No   Current Outpatient Medications  Medication Sig Dispense Refill  .  magnesium gluconate (MAGONATE) 500 MG tablet Take 500 mg by mouth daily as needed.    . NON FORMULARY CBD oil - 2 drops daily under tongue    . OVER THE COUNTER MEDICATION Doterra Lifelong Vitality Pack, PDR Prime, Bone Nutrient     No current facility-administered medications for this visit.    Allergies  Allergen Reactions  . Oxycodone-Acetaminophen Nausea And Vomiting and Other (See Comments)    Hallucinations and bad dreams     Review of Systems: All systems reviewed and negative except where noted in HPI.   Lab Results  Component Value Date   WBC 4.7 04/22/2017   HGB 13.7 04/22/2017   HCT 39.9 04/22/2017     MCV 91.0 04/22/2017   PLT 94.0 (L) 04/22/2017    Lab Results  Component Value Date   CREATININE 0.59 04/22/2017   BUN 12 04/22/2017   NA 139 04/22/2017   K 3.5 04/22/2017   CL 103 04/22/2017   CO2 29 04/22/2017   Lab Results  Component Value Date   ALT 28 04/22/2017   AST 36 04/22/2017   ALKPHOS 99 04/22/2017   BILITOT 0.8 04/22/2017   Lab Results  Component Value Date   INR 1.3 (H) 04/22/2017   INR 1.3 (H) 08/14/2016   INR 1.43 09/13/2015     Physical Exam: BP 92/60   Pulse 62   Ht 5\' 2"  (1.575 m)   Wt 97 lb 6 oz (44.2 kg)   BMI 17.81 kg/m  Constitutional: Pleasant, thin appearing, female in no acute distress. HEENT: Normocephalic and atraumatic. Conjunctivae are normal. No scleral icterus. Neck supple.  Cardiovascular: Normal rate, regular rhythm.  Pulmonary/chest: Effort normal and breath sounds normal. No wheezing, rales or rhonchi. Abdominal: Soft, nondistended, nontender. . There are no masses palpable.  Extremities: no edema Lymphadenopathy: No cervical adenopathy noted. Neurological: Alert and oriented to person place and time. No asterixis. Skin: Skin is warm and dry. No rashes noted. Psychiatric: Normal mood and affect. Behavior is normal.   ASSESSMENT AND PLAN: 57 year old female here for reassessment of the following issues:  Autoimmune hepatitis / cirrhosis:  Initially decompensated with jaundice, ascites, esophageal varices.  Her immunoglobulin was initially over 2300, with steroids it normalized and her transaminases normalized.  I referred her to see Hepatology for long-term management previously given her intolerance of azathioprine, she never followed up for that visit.  She self tapered off steroids over a year ago and has not seen me since that time.  Her labs currently show that her immunoglobulin is around 1500 and her LFTs are normal.  She has no jaundice and no evidence of ascites which is excellent.  I did caution her that she should not  be managing her steroids on her own and she needs to follow up with us as directed for this disorder, as she is at risk for decompensations moving forward.  She previously has seen a holistic provider and a chiropractor who told her to take essential oils and manage this with spinal manipulation therapy as well as vitamin C infusions.  I cautioned her there is no evidence that this is effective for autoimmune hepatitis and would not recommend this in itself, and she agreed.  Currently she seems pretty well compensated and happy her labs are improved, recommend the following - AFP level to complete HCC screening - [facility] - due for EGD for varices surveillance. Recommend this be done at the hospital as primary  therapy for her will entail banding given her intolerance to beta blockade. Discussed risks / benefits and she wished to proceed - I once again will refer her to hepatology for evaluation for long-term management discussion.  We will hold off on steroids now given her labs look good at this time, although I think we should repeat her labs in 3 months to ensure stable.  Perhaps if she flares in the future we would treat with budesonide given she had side effects with prednisone. We will see what Hepatology says.  Colon cancer screening - patient declines colonoscopy, however she is willing to proceed with stool based testing.  She wished to proceed with Cologuard.   Ileene Patrick, MD [facility]astro[facility]ology Pager [phone]

## 2017-04-26 NOTE — Patient Instructions (Addendum)
You will be due for an ultrasound in June 2019. We will call you with an appointment when it gets closer to that time.    Your physician has requested that you go to the basement for lab work before leaving today.  We will refer you to Dr. Delilah Shanawn Drazek's office for autoimmune hepatitis.   Your provider has ordered Cologuard testing as an option for colon cancer screening. This is performed by [facility] and may be out of network with your insurance. PRIOR to completing the test, it is YOUR responsibility to contact your insurance about covered benefits for this test. Your out of pocket expense could be anywhere from $0.00 to $649.00.   When you call to check coverage with your insurer, please provide the following information:   -The ONLY provider of Cologuard is [facility]  - CPT code for Cologuard is [id number].  [facility] NPI # [id number]  -[facility] Tax ID # [id number]   We have already sent your demographic and insurance information to [facility] (phone number [phone]) and they should contact you within the next week regarding your test. If you have not heard from them within the next week, please call our office at [phone].

## 2017-04-27 ENCOUNTER — Telehealth: Payer: Self-pay

## 2017-04-27 LAB — AFP TUMOR MARKER: AFP-Tumor Marker: 3.9 ng/mL

## 2017-04-27 NOTE — Telephone Encounter (Signed)
[facility] Completed and faxed with copies of recent labs, imaging and 04-26-17 OV note to [phone].  Their office # is [phone].

## 2017-05-11 NOTE — Telephone Encounter (Signed)
[facility] called and confirmed pt has appt with Michelle Schneider on Friday, March 1st at 10:30am.

## 2017-06-02 ENCOUNTER — Other Ambulatory Visit: Payer: Self-pay

## 2017-06-02 ENCOUNTER — Encounter (HOSPITAL_COMMUNITY): Payer: Self-pay | Admitting: Emergency Medicine

## 2017-06-07 ENCOUNTER — Other Ambulatory Visit: Payer: Self-pay

## 2017-06-07 ENCOUNTER — Encounter: Payer: Self-pay | Admitting: Gastroenterology

## 2017-06-07 LAB — COLOGUARD: Cologuard: NEGATIVE

## 2017-06-14 ENCOUNTER — Other Ambulatory Visit: Payer: Self-pay

## 2017-06-15 ENCOUNTER — Ambulatory Visit (HOSPITAL_COMMUNITY): Payer: No Typology Code available for payment source | Admitting: Registered Nurse

## 2017-06-15 ENCOUNTER — Ambulatory Visit (HOSPITAL_COMMUNITY)
Admission: RE | Admit: 2017-06-15 | Discharge: 2017-06-15 | Disposition: A | Discharge status: Home / Self Care | Payer: No Typology Code available for payment source | Source: Ambulatory Visit | Attending: Gastroenterology | Admitting: Gastroenterology

## 2017-06-15 ENCOUNTER — Encounter (HOSPITAL_COMMUNITY): Payer: Self-pay | Admitting: Registered Nurse

## 2017-06-15 ENCOUNTER — Encounter (HOSPITAL_COMMUNITY)
Admission: RE | Disposition: A | Discharge status: Home / Self Care | Payer: Self-pay | Source: Ambulatory Visit | Attending: Gastroenterology

## 2017-06-15 ENCOUNTER — Other Ambulatory Visit: Payer: Self-pay

## 2017-06-15 DIAGNOSIS — Z885 Allergy status to narcotic agent status: Secondary | ICD-10-CM | POA: Diagnosis not present

## 2017-06-15 DIAGNOSIS — I739 Peripheral vascular disease, unspecified: Secondary | ICD-10-CM | POA: Diagnosis not present

## 2017-06-15 DIAGNOSIS — Z79899 Other long term (current) drug therapy: Secondary | ICD-10-CM | POA: Diagnosis not present

## 2017-06-15 DIAGNOSIS — K449 Diaphragmatic hernia without obstruction or gangrene: Secondary | ICD-10-CM | POA: Diagnosis not present

## 2017-06-15 DIAGNOSIS — Z1211 Encounter for screening for malignant neoplasm of colon: Secondary | ICD-10-CM

## 2017-06-15 DIAGNOSIS — R011 Cardiac murmur, unspecified: Secondary | ICD-10-CM | POA: Insufficient documentation

## 2017-06-15 DIAGNOSIS — K7460 Unspecified cirrhosis of liver: Secondary | ICD-10-CM

## 2017-06-15 DIAGNOSIS — I7300 Raynaud's syndrome without gangrene: Secondary | ICD-10-CM | POA: Diagnosis not present

## 2017-06-15 DIAGNOSIS — I8510 Secondary esophageal varices without bleeding: Secondary | ICD-10-CM | POA: Diagnosis not present

## 2017-06-15 DIAGNOSIS — K754 Autoimmune hepatitis: Secondary | ICD-10-CM

## 2017-06-15 DIAGNOSIS — E785 Hyperlipidemia, unspecified: Secondary | ICD-10-CM | POA: Insufficient documentation

## 2017-06-15 DIAGNOSIS — K7469 Other cirrhosis of liver: Secondary | ICD-10-CM

## 2017-06-15 HISTORY — PX: ESOPHAGOGASTRODUODENOSCOPY (EGD) WITH PROPOFOL: SHX5813

## 2017-06-15 SURGERY — ESOPHAGOGASTRODUODENOSCOPY (EGD) WITH PROPOFOL
Anesthesia: General

## 2017-06-15 MED ORDER — EPHEDRINE SULFATE-NACL 50-0.9 MG/10ML-% IV SOSY
PREFILLED_SYRINGE | INTRAVENOUS | Status: DC | PRN
  Administered 2017-06-15: 10 mg via INTRAVENOUS

## 2017-06-15 MED ORDER — SODIUM CHLORIDE 0.9 % IV SOLN: INTRAVENOUS | Status: DC

## 2017-06-15 MED ORDER — PROPOFOL 10 MG/ML IV BOLUS
INTRAVENOUS | Status: DC | PRN
  Administered 2017-06-15 (×5): 20 mg via INTRAVENOUS

## 2017-06-15 MED ORDER — LIDOCAINE 2% (20 MG/ML) 5 ML SYRINGE
INTRAMUSCULAR | Status: DC | PRN
  Administered 2017-06-15: 40 mg via INTRAVENOUS

## 2017-06-15 MED ORDER — PROPOFOL 10 MG/ML IV BOLUS
INTRAVENOUS | Status: AC
  Filled 2017-06-15: qty 40

## 2017-06-15 MED ORDER — LACTATED RINGERS IV SOLN
INTRAVENOUS | Status: DC
  Administered 2017-06-15: 1000 mL via INTRAVENOUS

## 2017-06-15 SURGICAL SUPPLY — 15 items

## 2017-06-15 NOTE — Transfer of Care (Addendum)
Immediate Anesthesia Transfer of Care Note  Patient: Michelle Schneider  Procedure(s) Performed: ESOPHAGOGASTRODUODENOSCOPY (EGD) WITH PROPOFOL (N/A )  Patient Location: PACU  Anesthesia Type:MAC  Level of Consciousness: sedated  Airway & Oxygen Therapy: Patient Spontanous Breathing and Patient connected to nasal cannula oxygen  Post-op Assessment: Report given to RN and Post -op Vital signs reviewed and stable  Post vital signs: Reviewed and stable, treated with ephedrine and increased fluids.  Last Vitals:  Vitals Value Taken Time  BP 82/64 06/15/2017 11:07 AM  Temp    Pulse 69 06/15/2017 11:07 AM  Resp 19 06/15/2017 11:07 AM  SpO2 100 % 06/15/2017 11:07 AM  Vitals shown include unvalidated device data.  Last Pain:  Vitals:   06/15/17 1105  TempSrc:   PainSc: 0-No pain         Complications: No apparent anesthesia complications

## 2017-06-15 NOTE — Anesthesia Procedure Notes (Signed)
Date/Time: 06/15/2017 10:51 AM Performed by: Jhonnie GarnerMarshall, Beth M, CRNA Oxygen Delivery Method: Nasal cannula

## 2017-06-15 NOTE — Anesthesia Preprocedure Evaluation (Addendum)
Anesthesia Evaluation  Patient identified by MRN, date of birth, ID band Patient awake    Reviewed: Allergy & Precautions, NPO status , Patient's Chart, lab work & pertinent test results  History of Anesthesia Complications (+) Family history of anesthesia reaction  Airway Mallampati: II  TM Distance: >3 FB     Dental   Pulmonary    breath sounds clear to auscultation       Cardiovascular + Peripheral Vascular Disease  + Valvular Problems/Murmurs  Rhythm:Regular Rate:Normal     Neuro/Psych    GI/Hepatic negative GI ROS, (+) Hepatitis -  Endo/Other  negative endocrine ROS  Renal/GU negative Renal ROS     Musculoskeletal   Abdominal   Peds  Hematology  (+) anemia ,   Anesthesia Other Findings   Reproductive/Obstetrics                             Anesthesia Physical Anesthesia Plan  ASA: III  Anesthesia Plan: MAC   Post-op Pain Management:    Induction: Intravenous  PONV Risk Score and Plan: Treatment may vary due to age or medical condition, Ondansetron, Dexamethasone and Midazolam  Airway Management Planned: Simple Face Mask and Nasal Cannula  Additional Equipment:   Intra-op Plan:   Post-operative Plan:   Informed Consent: I have reviewed the patients History and Physical, chart, labs and discussed the procedure including the risks, benefits and alternatives for the proposed anesthesia with the patient or authorized representative who has indicated his/her understanding and acceptance.   Dental advisory given  Plan Discussed with: CRNA and Anesthesiologist  Anesthesia Plan Comments:        Anesthesia Quick Evaluation

## 2017-06-15 NOTE — Discharge Instructions (Signed)
Esophagogastroduodenoscopy, Care After °Refer to this sheet in the next few weeks. These instructions provide you with information about caring for yourself after your procedure. Your health care provider may also give you more specific instructions. Your treatment has been planned according to current medical practices, but problems sometimes occur. Call your health care provider if you have any problems or questions after your procedure. °What can I expect after the procedure? °After the procedure, it is common to have: °· A sore throat. °· Nausea. °· Bloating. °· Dizziness. °· Fatigue. ° °Follow these instructions at home: °· Do not eat or drink anything until the numbing medicine (local anesthetic) has worn off and your gag reflex has returned. You will know that the local anesthetic has worn off when you can swallow comfortably. °· Do not drive for 24 hours if you received a medicine to help you relax (sedative). °· If your health care provider took a tissue sample for testing during the procedure, make sure to get your test results. This is your responsibility. Ask your health care provider or the department performing the test when your results will be ready. °· Keep all follow-up visits as told by your health care provider. This is important. °Contact a health care provider if: °· You cannot stop coughing. °· You are not urinating. °· You are urinating less than usual. °Get help right away if: °· You have trouble swallowing. °· You cannot eat or drink. °· You have throat or chest pain that gets worse. °· You are dizzy or light-headed. °· You faint. °· You have nausea or vomiting. °· You have chills. °· You have a fever. °· You have severe abdominal pain. °· You have black, tarry, or bloody stools. °This information is not intended to replace advice given to you by your health care provider. Make sure you discuss any questions you have with your health care provider. °Document Released: 02/17/2012 Document  Revised: 08/08/2015 Document Reviewed: 01/24/2015 °Elsevier Interactive Patient Education © 2018 [employer] ° °

## 2017-06-15 NOTE — Op Note (Signed)
[facility] Patient Name: Michelle Schneider Procedure Date: 06/15/2017 MRN: [id number] Attending MD: Carlota Raspberry. Armbruster MD, MD Date of Birth: September 15, 1960 CSN: [id number] Age: 57 Admit Type: Outpatient Procedure:                Upper GI endoscopy Indications:              Cirrhosis rule out esophageal varices (history of                            autoimmune hepatitis, otherwise compensated                            currently) Providers:                Carlota Raspberry. Armbruster MD, MD, Cleda Daub, RN,                            Laurena Spies, Technician, Lajuana Carry, CRNA Referring MD:              Medicines:                Monitored Anesthesia Care Complications:            No immediate complications. Estimated blood loss:                            None. Estimated Blood Loss:     Estimated blood loss: none. Procedure:                Pre-Anesthesia Assessment:                           - Prior to the procedure, a History and Physical                            was performed, and patient medications and                            allergies were reviewed. The patient's tolerance of                            previous anesthesia was also reviewed. The risks                            and benefits of the procedure and the sedation                            options and risks were discussed with the patient.                            All questions were answered, and informed consent                            was obtained. Prior Anticoagulants: The patient has                            taken no previous  anticoagulant or antiplatelet                            agents. ASA Grade Assessment: III - A patient with                            severe systemic disease. After reviewing the risks                            and benefits, the patient was deemed in                            satisfactory condition to undergo the procedure.                           After obtaining  informed consent, the endoscope was                            passed under direct vision. Throughout the                            procedure, the patient's blood pressure, pulse, and                            oxygen saturations were monitored continuously. The                            [id number]) scope was introduced through the                            mouth, and advanced to the second part of duodenum.                            The upper GI endoscopy was accomplished without                            difficulty. The patient tolerated the procedure                            well. Scope In: Scope Out: Findings:      Esophagogastric landmarks were identified: the Z-line was found at 34       cm, the gastroesophageal junction was found at 34 cm and the upper       extent of the gastric folds was found at 35 cm from the incisors.      A 1 cm hiatal hernia was present.      2 columns of trace varices were found in the lower third of the       esophagus just proximal to the GEJ. No high risk stigmata for bleeding       and flatenned with insufflation. Banding not performed.      The exam of the esophagus was otherwise normal.      The entire examined stomach was normal.      The duodenal bulb and second portion of the duodenum were normal. Impression:               -  Esophagogastric landmarks identified.                           - 1 cm hiatal hernia.                           - 2 trace columns of esophageal varices without any                            high risk stigmata - banding not done given small                            size, not enough tissue to band.                           - Normal stomach.                           - Normal duodenal bulb and second portion of the                            duodenum. Moderate Sedation:      No moderate sedation, case performed with MAC Recommendation:           - Patient has a contact number available for                             emergencies. The signs and symptoms of potential                            delayed complications were discussed with the                            patient. Return to normal activities tomorrow.                            Written discharge instructions were provided to the                            patient.                           - Resume previous diet.                           - Continue present medications.                           - Repeat upper endoscopy in 1 year for surveillance. Procedure Code(s):        --- Professional ---                           [phone], Esophagogastroduodenoscopy, flexible,                            transoral; diagnostic, including collection of  specimen(s) by brushing or washing, when performed                            (separate procedure) Diagnosis Code(s):        --- Professional ---                           K74.60, Unspecified cirrhosis of liver                           I85.10, Secondary esophageal varices without                            bleeding                           K44.9, Diaphragmatic hernia without obstruction or                            gangrene CPT copyright 2017 American Medical Association. All rights reserved. The codes documented in this report are preliminary and upon coder review may  be revised to meet current compliance requirements. Remo Lipps P. Armbruster MD, MD 06/15/2017 11:06:46 AM This report has been signed electronically. Number of Addenda: 0

## 2017-06-15 NOTE — Anesthesia Postprocedure Evaluation (Signed)
Anesthesia Post Note  Patient: Michelle Schneider  Procedure(s) Performed: ESOPHAGOGASTRODUODENOSCOPY (EGD) WITH PROPOFOL (N/A )     Patient location during evaluation: Endoscopy Anesthesia Type: General Level of consciousness: awake Pain management: pain level controlled Vital Signs Assessment: post-procedure vital signs reviewed and stable Respiratory status: spontaneous breathing Cardiovascular status: stable Anesthetic complications: no    Last Vitals:  Vitals:   06/15/17 1110 06/15/17 1120  BP: 123/76 (!) 101/59  Pulse: 63 75  Resp: 17 16  Temp:    SpO2: 100% 98%    Last Pain:  Vitals:   06/15/17 1120  TempSrc:   PainSc: 0-No pain                 CHARLENE GREEN

## 2017-06-15 NOTE — Interval H&P Note (Signed)
History and Physical Interval Note:  06/15/2017 10:47 AM  Michelle Schneider  has presented today for surgery, with the diagnosis of autoimmune hepatitis, cirrhosis/db  The various methods of treatment have been discussed with the patient and family. After consideration of risks, benefits and other options for treatment, the patient has consented to  Procedure(s): ESOPHAGOGASTRODUODENOSCOPY (EGD) WITH PROPOFOL (N/A) as a surgical intervention .  The patient's history has been reviewed, patient examined, no change in status, stable for surgery.  I have reviewed the patient's chart and labs.  Questions were answered to the patient's satisfaction.     Viviann SpareSteven P Armbruster

## 2017-06-15 NOTE — H&P (Signed)
HPI:   Michelle Schneider is a 57 y.o. female with a history of autoimmune hepatitis and cirrhosis, history of small varices, here for a surveillance EGD with possible banding.' Asymptomatic. Intolerant to beta blockers in the past.  Past Medical History:  Diagnosis Date  . Autoimmune hepatitis (HCC)   . Cirrhosis (HCC)   . Esophageal varices (HCC)   . Family history of adverse reaction to anesthesia    cousin had a difficult time waking up after appendectomy  . Heart murmur    "diagnosed with a very slight heart murmur in 2001 but no other doctor has been able to hear it"  . Hyperlipidemia   . Raynaud's disease     Past Surgical History:  Procedure Laterality Date  . ABDOMINAL HYSTERECTOMY    . ORIF HUMERUS FRACTURE Right 05/23/2015   Procedure: OPEN REDUCTION INTERNAL FIXATION (ORIF) RIGHT  PROXIMAL HUMERUS FRACTURE;  Surgeon: Francena Hanly, MD;  Location: [facility] OR;  Service: Orthopedics;  Laterality: Right;  . WISDOM TOOTH EXTRACTION      Family History  Problem Relation Age of Onset  . Dementia Father   . Parkinson's disease Mother   . Stroke Brother   . Colon cancer Neg Hx   . Esophageal cancer Neg Hx   . Pancreatic cancer Neg Hx   . Rectal cancer Neg Hx   . Stomach cancer Neg Hx      Social History   Tobacco Use  . Smoking status: Never Smoker  . Smokeless tobacco: Never Used  Substance Use Topics  . Alcohol use: Yes    Alcohol/week: 0.0 oz    Comment: occasional  . Drug use: No    Prior to Admission medications   Medication Sig Start Date End Date Taking? Authorizing Provider  magnesium gluconate (MAGONATE) 500 MG tablet Take 500 mg by mouth daily as needed (for muscle spasms).    Yes [provider]  NON FORMULARY Place 2 drops under the tongue daily. CBD Oil Doterra   Yes [provider]  OVER THE COUNTER MEDICATION Take 3 tablets by mouth 2 (two) times daily. Doterra Lifelong Vitality Pack   Yes [provider]  OVER  THE COUNTER MEDICATION Take 1 tablet by mouth 2 (two) times daily. Bone Nutrient Supplement   Yes [provider]    Current Facility-Administered Medications  Medication Dose Route Frequency Provider Last Rate Last Dose  . 0.9 %  sodium chloride infusion   Intravenous Continuous Armbruster, Willaim Rayas, MD      . lactated ringers infusion   Intravenous Continuous Adela Lank Willaim Rayas, MD 10 mL/hr at 06/15/17 1017 1,000 mL at 06/15/17 1017    Allergies as of 04/26/2017 - Review Complete 04/26/2017  Allergen Reaction Noted  . Oxycodone-acetaminophen Nausea And Vomiting and Other (See Comments) 05/17/2015     Review of Systems:    As per HPI, otherwise negative    Physical Exam:  Vital signs in last 24 hours: Temp:  [97.6 F (36.4 C)] 97.6 F (36.4 C) (04/02 1000) Pulse Rate:  [67] 67 (04/02 1000) Resp:  [19] 19 (04/02 1000) BP: (140)/(96) 140/96 (04/02 1000) SpO2:  [100 %] 100 % (04/02 1000) Weight:  [97 lb (44 kg)] 97 lb (44 kg) (04/02 1000)   General:   Pleasant female in NAD Lungs:  Respirations even and unlabored. Lungs clear to auscultation bilaterally.   No wheezes, crackles, or rhonchi.  Heart:  Regular  rate and rhythm; no MRG Abdomen:  Soft, nondistended, nontender.  No appreciable masses or hepatomegaly.  Extremities:  Without edema. Neurologic:  Alert and  oriented x4;  grossly normal neurologically.  Lab Results  Component Value Date   WBC 4.7 04/22/2017   HGB 13.7 04/22/2017   HCT 39.9 04/22/2017   MCV 91.0 04/22/2017   PLT 94.0 (L) 04/22/2017    Lab Results  Component Value Date   CREATININE 0.59 04/22/2017   BUN 12 04/22/2017   NA 139 04/22/2017   K 3.5 04/22/2017   CL 103 04/22/2017   CO2 29 04/22/2017    Lab Results  Component Value Date   ALT 28 04/22/2017   AST 36 04/22/2017   ALKPHOS 99 04/22/2017   BILITOT 0.8 04/22/2017      Impression / Plan:   57 y/o female with history of autoimmune hepatitis / cirrhosis, here for varices  screening and banding if needed. I have discussed risks / benefits of EGD and banding with her and she wishes to proceed.  Ileene PatrickSteven Armbruster, MD [facility] Pager [phone]

## 2017-06-16 ENCOUNTER — Encounter (HOSPITAL_COMMUNITY): Payer: Self-pay | Admitting: Gastroenterology

## 2017-07-28 ENCOUNTER — Encounter: Payer: Self-pay | Admitting: Gastroenterology

## 2017-07-28 NOTE — Telephone Encounter (Signed)
ERROR

## 2017-07-30 ENCOUNTER — Telehealth: Payer: Self-pay | Admitting: Gastroenterology

## 2017-07-30 NOTE — Telephone Encounter (Signed)
Michelle Schneider I have had a chance to review her visit with [name] from March. I don't see any other testing that has been done since that time. It looks like they wanted to see her again in June. Is there anything specific she is requesting? Thanks

## 2017-07-30 NOTE — Telephone Encounter (Signed)
Patient had labs done in March, I have asked her to have them faxed over here. Patient also concerned about her skin on feet and lower legs has started to discolor in last month, states it is "brown". No other skin discoloration, patient states she is feeling well.

## 2017-07-30 NOTE — Telephone Encounter (Signed)
Routed to Dr. Armbruster. 

## 2017-08-02 ENCOUNTER — Other Ambulatory Visit: Payer: Self-pay

## 2017-08-02 DIAGNOSIS — L819 Disorder of pigmentation, unspecified: Secondary | ICD-10-CM

## 2017-08-02 DIAGNOSIS — K754 Autoimmune hepatitis: Secondary | ICD-10-CM

## 2017-08-02 NOTE — Telephone Encounter (Signed)
Okay. Given her symptoms it may be good to do LFTs to ensure stable. She otherwise had normal labs in February with me, was going to repeat in August for all her labs, but would check LFTs now to ensure bilirubin okay. Thanks

## 2017-08-02 NOTE — Telephone Encounter (Signed)
Left detailed message for patient to come do some blood work, hopefully this week. When we get the results will let her know. As always if she has questions or concerns to call back.

## 2017-08-13 ENCOUNTER — Other Ambulatory Visit (INDEPENDENT_AMBULATORY_CARE_PROVIDER_SITE_OTHER): Payer: PRIVATE HEALTH INSURANCE

## 2017-08-13 DIAGNOSIS — L819 Disorder of pigmentation, unspecified: Secondary | ICD-10-CM | POA: Diagnosis not present

## 2017-08-13 DIAGNOSIS — K754 Autoimmune hepatitis: Secondary | ICD-10-CM

## 2017-08-13 LAB — HEPATIC FUNCTION PANEL
ALT: 21 U/L (ref 0–35)
AST: 30 U/L (ref 0–37)
Albumin: 4.5 g/dL (ref 3.5–5.2)
Alkaline Phosphatase: 78 U/L (ref 39–117)
Bilirubin, Direct: 0.2 mg/dL (ref 0.0–0.3)
Total Bilirubin: 0.8 mg/dL (ref 0.2–1.2)
Total Protein: 8.1 g/dL (ref 6.0–8.3)

## 2017-08-24 ENCOUNTER — Other Ambulatory Visit: Payer: Self-pay | Admitting: Nurse Practitioner

## 2017-09-13 IMAGING — MR MR ABDOMEN WO/W CM
11 of 21 series · 19 of 48 positions shown · IV contrast (5 EOVIST)
Comparison: No prior abdominal MRI. CT the abdomen and pelvis
08/24/2015.

CLINICAL DATA: 55-year-old female with evidence of cirrhosis on
recent CT examination. Evaluate for underlying liver lesions.

EXAM:
MRI ABDOMEN WITHOUT AND WITH CONTRAST
TECHNIQUE: Multiplanar multisequence MR imaging of the abdomen was performed
both before and after the administration of intravenous contrast.
CONTRAST:  5 mL of Eovist.

[Series 3: T1 dynamic · axial · 4.0mm · 0.78mm/px · z∈[-43,+163]mm · 2 of 104 slices shown (1 of 3)]
[im 1/104]
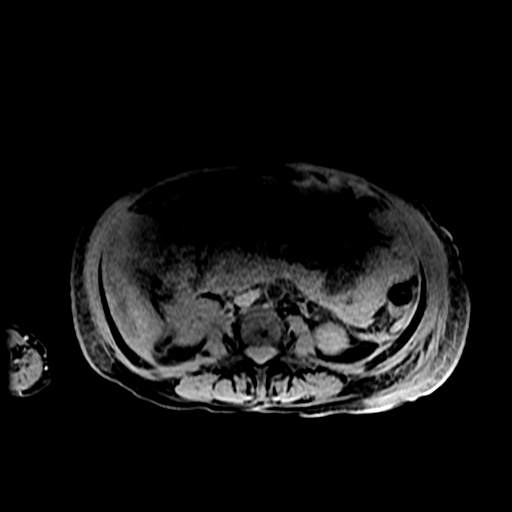
[im 104/104]
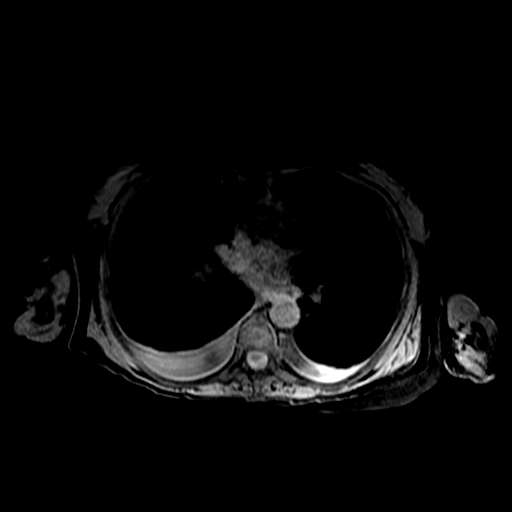

[Series 11: T1 dynamic post-contrast · axial · 4.0mm · 0.78mm/px · z∈[-43,+163]mm · 2 of 104 slices shown (1 of 2)]
[im 1/104]
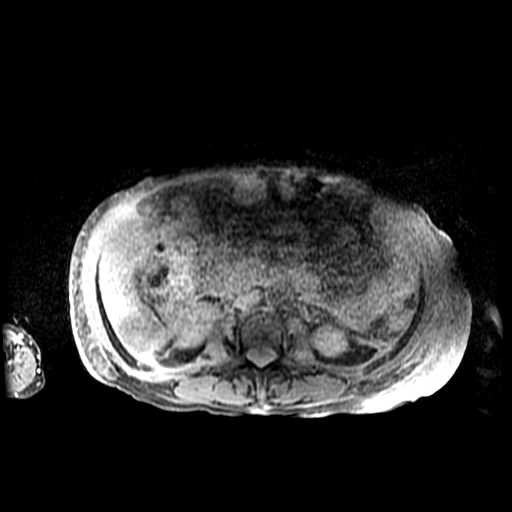
[im 104/104]
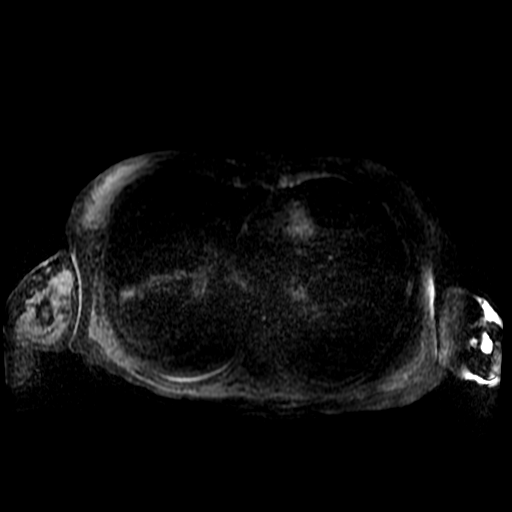

[Series 13: T2 fat-sat · axial · 6.0mm · 0.74mm/px · 1 of 30 slices shown (1 of 3)]
[im 1/30]
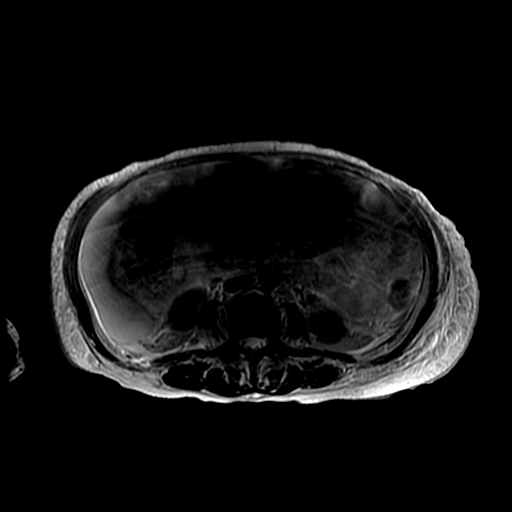

[Series 14: DWI b500 · axial · 8.0mm · 1.56mm/px · 1 of 44 slices shown]
[im 1/44]
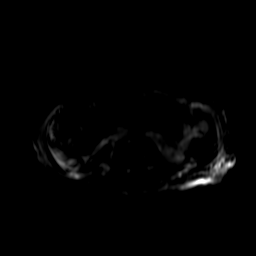

[Series 15: T2 fat-sat · axial · 6.0mm · 0.74mm/px · 1 of 28 slices shown (2 of 3)]
[im 1/28]
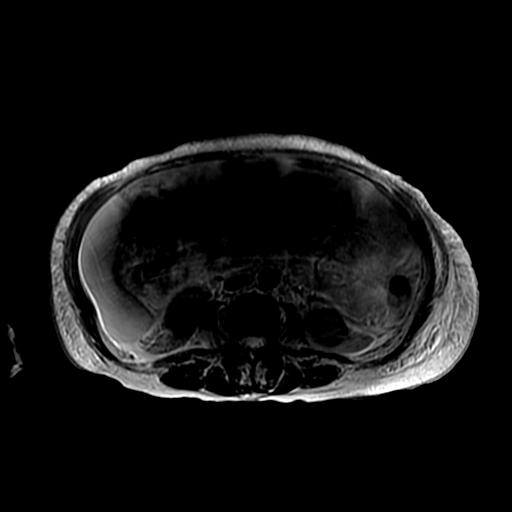

[Series 16: bSSFP · axial · 6.0mm · 0.82mm/px · 1 of 40 slices shown]
[im 1/40]
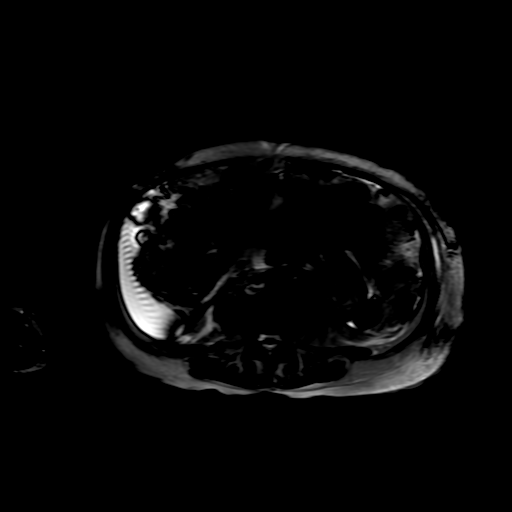

[Series 17: T2 fat-sat · axial · 8.0mm · 0.74mm/px · 1 of 26 slices shown (3 of 3)]
[im 1/26]
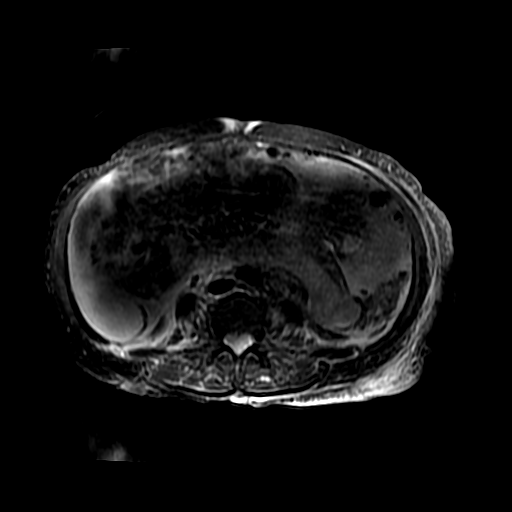

[Series 100: ((date)..104)-((date)..104) · axial · 4.0mm · 0.78mm/px · z∈[-43,+163]mm · 3 of 104 slices shown]
[im 1/104]
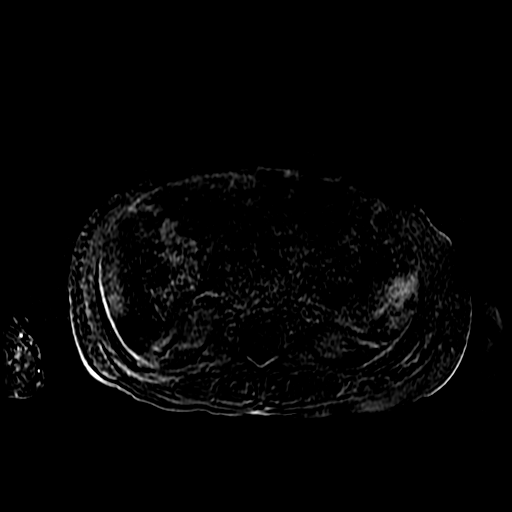
[im 52/104]
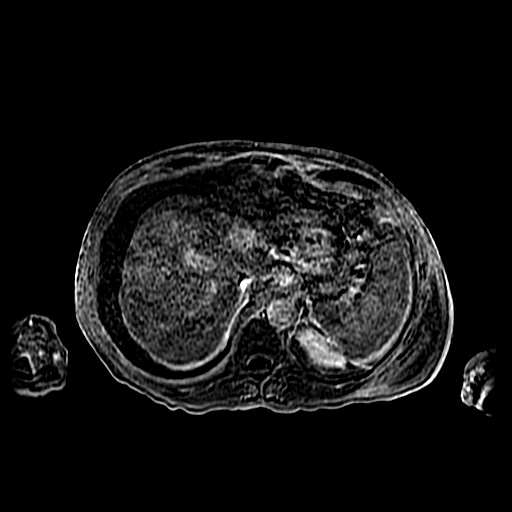
[im 104/104]
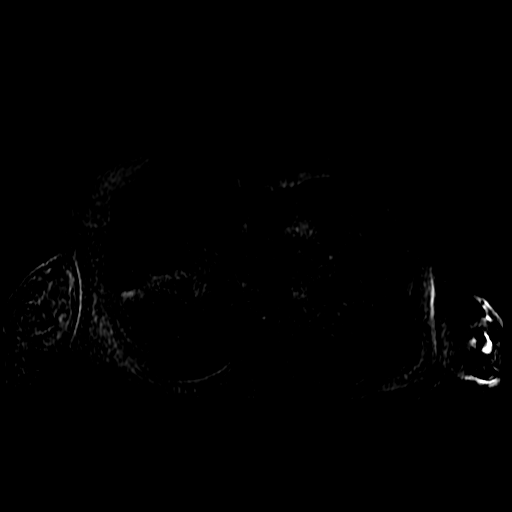

[Series 301: T1 dynamic · axial · 4.0mm · 0.78mm/px · z∈[-43,+163]mm · 3 of 104 slices shown (2 of 3)]
[im 1/104]
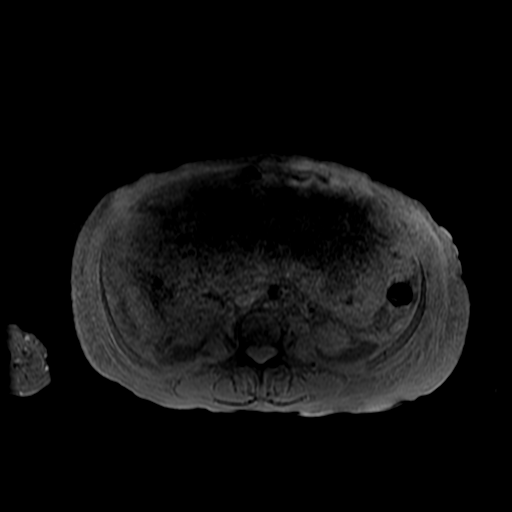
[im 52/104]
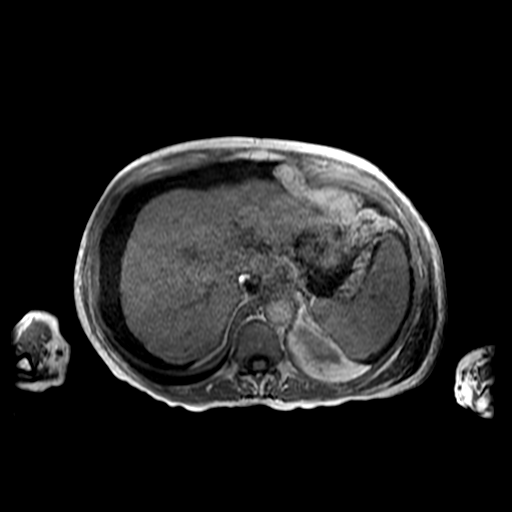
[im 104/104]
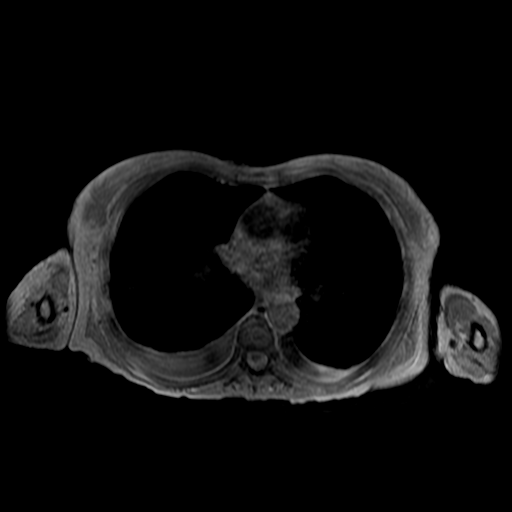

[Series 302: T1 dynamic · axial · 4.0mm · 0.78mm/px · z∈[-43,+163]mm · 3 of 104 slices shown (3 of 3)]
[im 1/104]
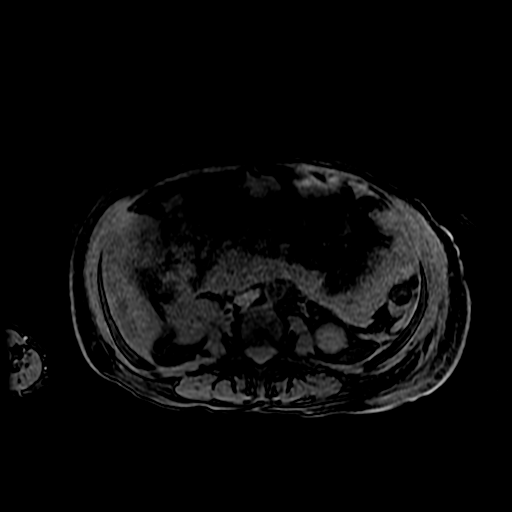
[im 52/104]
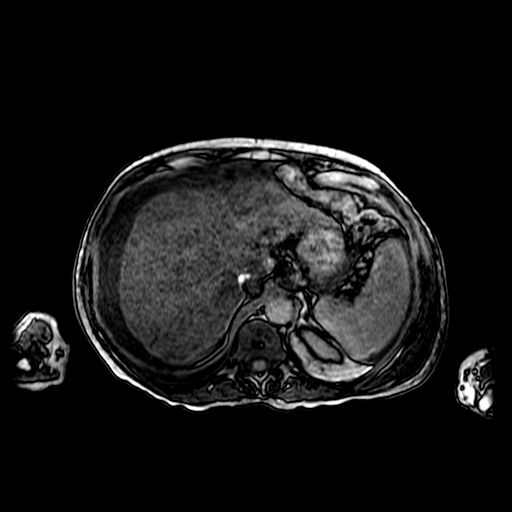
[im 104/104]
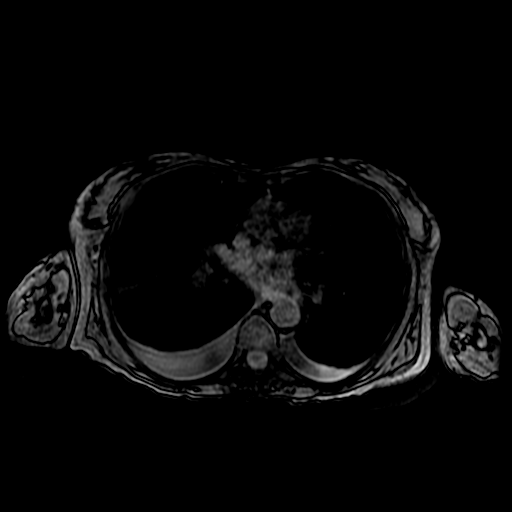

[Series 400: T1 dynamic post-contrast · axial · non-contrast · 4.0mm · 0.78mm/px · 1 of 104 slices shown (2 of 2)]
[im 1/104]
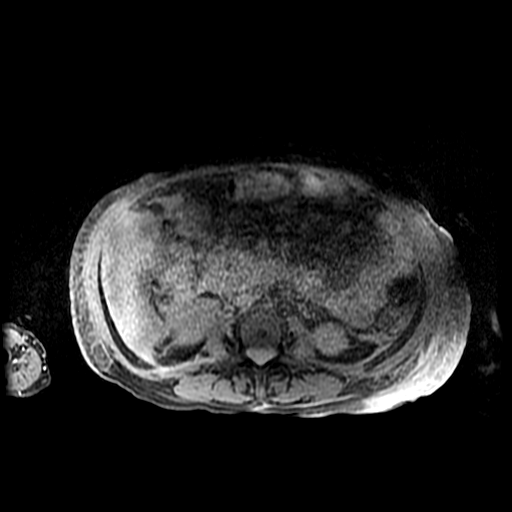

[19 of 48 positions shown; findings below may reference images not displayed]

FINDINGS: Lower chest: Irregular areas of signal intensity within the lung
bases bilaterally (right greater than left), predominantly in the
right lower lobe, concerning for consolidation. Small bilateral
pleural effusions lying dependently (right greater than left).
Moderate-sized hiatal hernia.

Hepatobiliary: The liver has a shrunken appearance and nodular
contour, compatible with underlying cirrhosis. No discrete cystic or
solid hepatic lesions are identified. Specifically, no hypervascular
hepatic lesion noted to suggest underlying hepatocellular carcinoma
at this time. No intra or extrahepatic biliary ductal dilatation.
Dependent material in the gallbladder is T2 hypointense and T1
hyperintense, compatible with biliary sludge and/or tiny gallstones.
Gallbladder does not appear distended.

Pancreas: No pancreatic mass. No pancreatic ductal dilatation. No
pancreatic or peripancreatic organized fluid collections. Small
amount of T2 signal intensity adjacent to the pancreas throughout
the retroperitoneum, likely related to underlying anasarca.

Spleen: Unremarkable.

Adrenals/Urinary Tract: Kidneys and bilateral adrenal glands are
normal in appearance. No hydroureteronephrosis in the visualized
abdomen.

Stomach/Bowel: Visualized portions are unremarkable.

Vascular/Lymphatic: No aneurysm identified in the visualized
abdominal vasculature. No lymphadenopathy noted in the abdomen.

Other: Large volume of ascites. Diffuse soft tissue edema in the
retroperitoneum and abdominal wall.

Musculoskeletal: No aggressive osseous lesions are noted in the
visualized portions of the skeleton.
IMPRESSION: 1. Morphologic changes in the liver compatible with underlying
cirrhosis. No hypervascular lesion identified to suggest underlying
hepatocellular carcinoma at this time.
2. Large volume of ascites, retroperitoneal edema and diffuse body
wall edema with bilateral pleural effusions, suggestive of a state
of anasarca.
3. Patchy areas of increased signal intensity throughout the lung
bases, concerning for consolidation from either infection or
aspiration.

## 2017-12-23 ENCOUNTER — Telehealth: Payer: Self-pay

## 2017-12-23 DIAGNOSIS — K754 Autoimmune hepatitis: Secondary | ICD-10-CM

## 2017-12-23 NOTE — Telephone Encounter (Signed)
-----   Message from Cooper Render, CMA sent at 08/17/2017  4:34 PM EDT ----- Regarding: labs due repeat LFTs, CBC, and INR in October for autoimmune Hepatitis

## 2017-12-23 NOTE — Telephone Encounter (Signed)
Called and spoke to pt.  She will go to the lab one day next week.

## 2017-12-31 ENCOUNTER — Other Ambulatory Visit (INDEPENDENT_AMBULATORY_CARE_PROVIDER_SITE_OTHER): Payer: No Typology Code available for payment source

## 2017-12-31 DIAGNOSIS — K754 Autoimmune hepatitis: Secondary | ICD-10-CM

## 2017-12-31 LAB — CBC WITH DIFFERENTIAL/PLATELET
Basophils Absolute: 0.1 10*3/uL (ref 0.0–0.1)
Basophils Relative: 1.8 % (ref 0.0–3.0)
Eosinophils Absolute: 0.1 10*3/uL (ref 0.0–0.7)
Eosinophils Relative: 2.2 % (ref 0.0–5.0)
HCT: 42.3 % (ref 36.0–46.0)
Hemoglobin: 14.7 g/dL (ref 12.0–15.0)
Lymphocytes Relative: 36.3 % (ref 12.0–46.0)
Lymphs Abs: 1.8 10*3/uL (ref 0.7–4.0)
MCHC: 34.7 g/dL (ref 30.0–36.0)
MCV: 96 fl (ref 78.0–100.0)
Monocytes Absolute: 0.5 10*3/uL (ref 0.1–1.0)
Monocytes Relative: 9.5 % (ref 3.0–12.0)
Neutro Abs: 2.4 10*3/uL (ref 1.4–7.7)
Neutrophils Relative %: 50.2 % (ref 43.0–77.0)
Platelets: 111 10*3/uL — ABNORMAL LOW (ref 150.0–400.0)
RBC: 4.41 Mil/uL (ref 3.87–5.11)
RDW: 14.8 % (ref 11.5–15.5)
WBC: 4.8 10*3/uL (ref 4.0–10.5)

## 2017-12-31 LAB — HEPATIC FUNCTION PANEL
ALT: 26 U/L (ref 0–35)
AST: 37 U/L (ref 0–37)
Albumin: 4.7 g/dL (ref 3.5–5.2)
Alkaline Phosphatase: 66 U/L (ref 39–117)
Bilirubin, Direct: 0.3 mg/dL (ref 0.0–0.3)
Total Bilirubin: 1.6 mg/dL — ABNORMAL HIGH (ref 0.2–1.2)
Total Protein: 8 g/dL (ref 6.0–8.3)

## 2017-12-31 LAB — PROTIME-INR
INR: 1.3 ratio — ABNORMAL HIGH (ref 0.8–1.0)
Prothrombin Time: 14.6 s — ABNORMAL HIGH (ref 9.6–13.1)

## 2018-01-04 ENCOUNTER — Other Ambulatory Visit: Payer: Self-pay

## 2018-01-04 DIAGNOSIS — R188 Other ascites: Secondary | ICD-10-CM

## 2018-01-04 DIAGNOSIS — K7460 Unspecified cirrhosis of liver: Secondary | ICD-10-CM

## 2018-01-04 DIAGNOSIS — K754 Autoimmune hepatitis: Secondary | ICD-10-CM

## 2018-01-10 ENCOUNTER — Other Ambulatory Visit: Payer: No Typology Code available for payment source

## 2018-01-28 ENCOUNTER — Ambulatory Visit: Payer: No Typology Code available for payment source | Admitting: Gastroenterology

## 2018-02-07 ENCOUNTER — Ambulatory Visit: Payer: Self-pay | Admitting: Gastroenterology

## 2018-09-08 ENCOUNTER — Telehealth: Payer: Self-pay | Admitting: Gastroenterology

## 2018-09-09 ENCOUNTER — Telehealth: Payer: Self-pay | Admitting: Gastroenterology

## 2018-09-09 NOTE — Telephone Encounter (Signed)
Attempted to call patient back, but was unable to leave a message on either phone. Sent message on My Chart to please call us back if she still needs [place].

## 2018-09-09 NOTE — Telephone Encounter (Signed)
Called patient and was able to reach her this time. Let her know that we don't test for Lime disease here at our practice; since she doesn't have a PCP, she would need to go to Urgent Care

## 2018-12-22 ENCOUNTER — Other Ambulatory Visit: Payer: Self-pay

## 2018-12-22 ENCOUNTER — Encounter (HOSPITAL_COMMUNITY)
Admission: EM | Disposition: A | Discharge status: Home Health | Payer: Self-pay | Source: Home / Self Care | Attending: Neurosurgery

## 2018-12-22 ENCOUNTER — Encounter (HOSPITAL_COMMUNITY): Payer: Self-pay

## 2018-12-22 ENCOUNTER — Inpatient Hospital Stay (HOSPITAL_COMMUNITY): Payer: No Typology Code available for payment source | Admitting: Certified Registered Nurse Anesthetist

## 2018-12-22 ENCOUNTER — Emergency Department (HOSPITAL_COMMUNITY): Payer: No Typology Code available for payment source

## 2018-12-22 ENCOUNTER — Inpatient Hospital Stay (HOSPITAL_COMMUNITY): Payer: No Typology Code available for payment source

## 2018-12-22 ENCOUNTER — Inpatient Hospital Stay (HOSPITAL_COMMUNITY)
Admission: EM | Admit: 2018-12-22 | Discharge: 2018-12-28 | DRG: 025 | Disposition: A | Discharge status: Home Health | Payer: No Typology Code available for payment source | Attending: Neurosurgery | Admitting: Neurosurgery

## 2018-12-22 DIAGNOSIS — Y92009 Unspecified place in unspecified non-institutional (private) residence as the place of occurrence of the external cause: Secondary | ICD-10-CM | POA: Diagnosis not present

## 2018-12-22 DIAGNOSIS — K769 Liver disease, unspecified: Secondary | ICD-10-CM

## 2018-12-22 DIAGNOSIS — S065XAA Traumatic subdural hemorrhage with loss of consciousness status unknown, initial encounter: Secondary | ICD-10-CM | POA: Diagnosis present

## 2018-12-22 DIAGNOSIS — K766 Portal hypertension: Secondary | ICD-10-CM | POA: Diagnosis present

## 2018-12-22 DIAGNOSIS — D684 Acquired coagulation factor deficiency: Secondary | ICD-10-CM | POA: Diagnosis present

## 2018-12-22 DIAGNOSIS — R260 Ataxic gait: Secondary | ICD-10-CM | POA: Diagnosis present

## 2018-12-22 DIAGNOSIS — S20219A Contusion of unspecified front wall of thorax, initial encounter: Secondary | ICD-10-CM | POA: Diagnosis present

## 2018-12-22 DIAGNOSIS — K754 Autoimmune hepatitis: Secondary | ICD-10-CM | POA: Diagnosis present

## 2018-12-22 DIAGNOSIS — I6200 Nontraumatic subdural hemorrhage, unspecified: Secondary | ICD-10-CM | POA: Diagnosis present

## 2018-12-22 DIAGNOSIS — D6959 Other secondary thrombocytopenia: Secondary | ICD-10-CM | POA: Diagnosis present

## 2018-12-22 DIAGNOSIS — Z79899 Other long term (current) drug therapy: Secondary | ICD-10-CM

## 2018-12-22 DIAGNOSIS — K7469 Other cirrhosis of liver: Secondary | ICD-10-CM

## 2018-12-22 DIAGNOSIS — Z681 Body mass index (BMI) 19 or less, adult: Secondary | ICD-10-CM | POA: Diagnosis not present

## 2018-12-22 DIAGNOSIS — E440 Moderate protein-calorie malnutrition: Secondary | ICD-10-CM | POA: Diagnosis present

## 2018-12-22 DIAGNOSIS — D539 Nutritional anemia, unspecified: Secondary | ICD-10-CM | POA: Diagnosis present

## 2018-12-22 DIAGNOSIS — Z8679 Personal history of other diseases of the circulatory system: Secondary | ICD-10-CM

## 2018-12-22 DIAGNOSIS — W109XXA Fall (on) (from) unspecified stairs and steps, initial encounter: Secondary | ICD-10-CM | POA: Diagnosis present

## 2018-12-22 DIAGNOSIS — R402143 Coma scale, eyes open, spontaneous, at hospital admission: Secondary | ICD-10-CM | POA: Diagnosis present

## 2018-12-22 DIAGNOSIS — G40109 Localization-related (focal) (partial) symptomatic epilepsy and epileptic syndromes with simple partial seizures, not intractable, without status epilepticus: Secondary | ICD-10-CM | POA: Diagnosis present

## 2018-12-22 DIAGNOSIS — E871 Hypo-osmolality and hyponatremia: Secondary | ICD-10-CM | POA: Diagnosis present

## 2018-12-22 DIAGNOSIS — J9601 Acute respiratory failure with hypoxia: Secondary | ICD-10-CM | POA: Diagnosis not present

## 2018-12-22 DIAGNOSIS — K7460 Unspecified cirrhosis of liver: Secondary | ICD-10-CM | POA: Diagnosis present

## 2018-12-22 DIAGNOSIS — Z978 Presence of other specified devices: Secondary | ICD-10-CM

## 2018-12-22 DIAGNOSIS — I7300 Raynaud's syndrome without gangrene: Secondary | ICD-10-CM | POA: Diagnosis present

## 2018-12-22 DIAGNOSIS — Z20828 Contact with and (suspected) exposure to other viral communicable diseases: Secondary | ICD-10-CM | POA: Diagnosis present

## 2018-12-22 DIAGNOSIS — S065X9A Traumatic subdural hemorrhage with loss of consciousness of unspecified duration, initial encounter: Secondary | ICD-10-CM | POA: Diagnosis present

## 2018-12-22 DIAGNOSIS — E876 Hypokalemia: Secondary | ICD-10-CM | POA: Diagnosis present

## 2018-12-22 DIAGNOSIS — Z823 Family history of stroke: Secondary | ICD-10-CM

## 2018-12-22 DIAGNOSIS — R402233 Coma scale, best verbal response, inappropriate words, at hospital admission: Secondary | ICD-10-CM | POA: Diagnosis present

## 2018-12-22 DIAGNOSIS — R402353 Coma scale, best motor response, localizes pain, at hospital admission: Secondary | ICD-10-CM | POA: Diagnosis present

## 2018-12-22 DIAGNOSIS — R64 Cachexia: Secondary | ICD-10-CM | POA: Diagnosis present

## 2018-12-22 DIAGNOSIS — R4701 Aphasia: Secondary | ICD-10-CM | POA: Diagnosis present

## 2018-12-22 DIAGNOSIS — W19XXXA Unspecified fall, initial encounter: Secondary | ICD-10-CM

## 2018-12-22 DIAGNOSIS — Z820 Family history of epilepsy and other diseases of the nervous system: Secondary | ICD-10-CM

## 2018-12-22 DIAGNOSIS — Z90710 Acquired absence of both cervix and uterus: Secondary | ICD-10-CM

## 2018-12-22 DIAGNOSIS — Z885 Allergy status to narcotic agent status: Secondary | ICD-10-CM

## 2018-12-22 HISTORY — PX: CRANIOTOMY: SHX93

## 2018-12-22 LAB — OSMOLALITY, URINE: Osmolality, Ur: 300 mOsm/kg (ref 300–900)

## 2018-12-22 LAB — POCT I-STAT 7, (LYTES, BLD GAS, ICA,H+H)
Acid-Base Excess: 1 mmol/L (ref 0.0–2.0)
Acid-base deficit: 1 mmol/L (ref 0.0–2.0)
Acid-base deficit: 3 mmol/L — ABNORMAL HIGH (ref 0.0–2.0)
Bicarbonate: 21.7 mmol/L (ref 20.0–28.0)
Bicarbonate: 23.3 mmol/L (ref 20.0–28.0)
Bicarbonate: 24.6 mmol/L (ref 20.0–28.0)
Calcium, Ion: 1.1 mmol/L — ABNORMAL LOW (ref 1.15–1.40)
Calcium, Ion: 1.15 mmol/L (ref 1.15–1.40)
Calcium, Ion: 1.04 mmol/L — ABNORMAL LOW (ref 1.15–1.40)
HCT: 20 % — ABNORMAL LOW (ref 36.0–46.0)
HCT: 22 % — ABNORMAL LOW (ref 36.0–46.0)
HCT: 28 % — ABNORMAL LOW (ref 36.0–46.0)
Hemoglobin: 6.8 g/dL — CL (ref 12.0–15.0)
Hemoglobin: 7.5 g/dL — ABNORMAL LOW (ref 12.0–15.0)
Hemoglobin: 9.5 g/dL — ABNORMAL LOW (ref 12.0–15.0)
O2 Saturation: 100 %
O2 Saturation: 100 %
O2 Saturation: 100 %
Patient temperature: 36.8
Patient temperature: 37.1
Patient temperature: 98.9
Potassium: 2.1 mmol/L — CL (ref 3.5–5.1)
Potassium: 2.2 mmol/L — CL (ref 3.5–5.1)
Potassium: 2.6 mmol/L — CL (ref 3.5–5.1)
Sodium: 132 mmol/L — ABNORMAL LOW (ref 135–145)
Sodium: 134 mmol/L — ABNORMAL LOW (ref 135–145)
Sodium: 140 mmol/L (ref 135–145)
TCO2: 23 mmol/L (ref 22–32)
TCO2: 24 mmol/L (ref 22–32)
TCO2: 26 mmol/L (ref 22–32)
pCO2 arterial: 34 mmHg (ref 32.0–48.0)
pCO2 arterial: 34.7 mmHg (ref 32.0–48.0)
pCO2 arterial: 35.3 mmHg (ref 32.0–48.0)
pH, Arterial: 7.413 (ref 7.350–7.450)
pH, Arterial: 7.434 (ref 7.350–7.450)
pH, Arterial: 7.452 — ABNORMAL HIGH (ref 7.350–7.450)
pO2, Arterial: 296 mmHg — ABNORMAL HIGH (ref 83.0–108.0)
pO2, Arterial: 373 mmHg — ABNORMAL HIGH (ref 83.0–108.0)
pO2, Arterial: 184 mmHg — ABNORMAL HIGH (ref 83.0–108.0)

## 2018-12-22 LAB — ABO/RH: ABO/RH(D): A NEG

## 2018-12-22 LAB — CBC WITH DIFFERENTIAL/PLATELET
Abs Immature Granulocytes: 0.02 10*3/uL (ref 0.00–0.07)
Basophils Absolute: 0 10*3/uL (ref 0.0–0.1)
Basophils Relative: 1 %
Eosinophils Absolute: 0 10*3/uL (ref 0.0–0.5)
Eosinophils Relative: 0 %
HCT: 29.7 % — ABNORMAL LOW (ref 36.0–46.0)
Hemoglobin: 10.5 g/dL — ABNORMAL LOW (ref 12.0–15.0)
Immature Granulocytes: 0 %
Lymphocytes Relative: 20 %
Lymphs Abs: 0.9 10*3/uL (ref 0.7–4.0)
MCH: 36.3 pg — ABNORMAL HIGH (ref 26.0–34.0)
MCHC: 35.4 g/dL (ref 30.0–36.0)
MCV: 102.8 fL — ABNORMAL HIGH (ref 80.0–100.0)
Monocytes Absolute: 0.9 10*3/uL (ref 0.1–1.0)
Monocytes Relative: 19 %
Neutro Abs: 2.9 10*3/uL (ref 1.7–7.7)
Neutrophils Relative %: 60 %
Platelets: 52 10*3/uL — ABNORMAL LOW (ref 150–400)
RBC: 2.89 MIL/uL — ABNORMAL LOW (ref 3.87–5.11)
RDW: 13.4 % (ref 11.5–15.5)
WBC: 4.7 10*3/uL (ref 4.0–10.5)
nRBC: 0 % (ref 0.0–0.2)

## 2018-12-22 LAB — PROTIME-INR
INR: 1.6 — ABNORMAL HIGH (ref 0.8–1.2)
Prothrombin Time: 18.5 seconds — ABNORMAL HIGH (ref 11.4–15.2)

## 2018-12-22 LAB — SARS CORONAVIRUS 2 BY RT PCR (HOSPITAL ORDER, PERFORMED IN ~~LOC~~ HOSPITAL LAB): SARS Coronavirus 2: NEGATIVE

## 2018-12-22 LAB — PREPARE RBC (CROSSMATCH)

## 2018-12-22 LAB — COMPREHENSIVE METABOLIC PANEL
ALT: 28 U/L (ref 0–44)
AST: 64 U/L — ABNORMAL HIGH (ref 15–41)
Albumin: 4.1 g/dL (ref 3.5–5.0)
Alkaline Phosphatase: 69 U/L (ref 38–126)
Anion gap: 13 (ref 5–15)
BUN: 10 mg/dL (ref 6–20)
CO2: 24 mmol/L (ref 22–32)
Calcium: 9.6 mg/dL (ref 8.9–10.3)
Chloride: 92 mmol/L — ABNORMAL LOW (ref 98–111)
Creatinine, Ser: 0.54 mg/dL (ref 0.44–1.00)
GFR calc Af Amer: >60 mL/min (ref >60)
GFR calc non Af Amer: >60 mL/min (ref >60)
Glucose, Bld: 122 mg/dL — ABNORMAL HIGH (ref 70–99)
Potassium: 3.2 mmol/L — ABNORMAL LOW (ref 3.5–5.1)
Sodium: 129 mmol/L — ABNORMAL LOW (ref 135–145)
Total Bilirubin: 6.9 mg/dL — ABNORMAL HIGH (ref 0.3–1.2)
Total Protein: 8.2 g/dL — ABNORMAL HIGH (ref 6.5–8.1)

## 2018-12-22 LAB — MRSA PCR SCREENING: MRSA by PCR: NEGATIVE

## 2018-12-22 LAB — OSMOLALITY: Osmolality: 284 mOsm/kg (ref 275–295)

## 2018-12-22 LAB — AMMONIA: Ammonia: 17 umol/L (ref 9–35)

## 2018-12-22 LAB — CREATININE, URINE, RANDOM: Creatinine, Urine: 11.26 mg/dL

## 2018-12-22 LAB — SODIUM, URINE, RANDOM: Sodium, Ur: 104 mmol/L

## 2018-12-22 SURGERY — CRANIOTOMY HEMATOMA EVACUATION SUBDURAL
Anesthesia: General | Site: Head | Laterality: Left

## 2018-12-22 MED ORDER — MAGNESIUM GLUCONATE 500 MG PO TABS
500.0000 mg | ORAL_TABLET | Freq: Every day | ORAL | Status: DC | PRN
  Administered 2018-12-27: 500 mg via ORAL
  Filled 2018-12-22 (×3): qty 1

## 2018-12-22 MED ORDER — THROMBIN 5000 UNITS EX SOLR
OROMUCOSAL | Status: DC | PRN
  Administered 2018-12-22: 5 mL via TOPICAL

## 2018-12-22 MED ORDER — PROPOFOL 1000 MG/100ML IV EMUL
0.0000 ug/kg/min | INTRAVENOUS | Status: DC
  Administered 2018-12-22 – 2018-12-23 (×3): 50 ug/kg/min via INTRAVENOUS
  Filled 2018-12-22 (×2): qty 100

## 2018-12-22 MED ORDER — FENTANYL CITRATE (PF) 100 MCG/2ML IJ SOLN
50.0000 ug | INTRAMUSCULAR | Status: DC | PRN
  Filled 2018-12-22: qty 2

## 2018-12-22 MED ORDER — CHLORHEXIDINE GLUCONATE 0.12% ORAL RINSE (MEDLINE KIT)
15.0000 mL | Freq: Two times a day (BID) | OROMUCOSAL | Status: DC
  Administered 2018-12-22 – 2018-12-23 (×2): 15 mL via OROMUCOSAL

## 2018-12-22 MED ORDER — BUPIVACAINE HCL (PF) 0.5 % IJ SOLN
INTRAMUSCULAR | Status: AC
  Filled 2018-12-22: qty 30

## 2018-12-22 MED ORDER — DEXAMETHASONE SODIUM PHOSPHATE 10 MG/ML IJ SOLN
INTRAMUSCULAR | Status: DC | PRN
  Administered 2018-12-22: 4 mg via INTRAVENOUS

## 2018-12-22 MED ORDER — BUPIVACAINE-EPINEPHRINE 0.5% -1:200000 IJ SOLN
INTRAMUSCULAR | Status: DC | PRN
  Administered 2018-12-22: 10 mL

## 2018-12-22 MED ORDER — BACITRACIN ZINC 500 UNIT/GM EX OINT
TOPICAL_OINTMENT | CUTANEOUS | Status: AC
  Filled 2018-12-22: qty 28.35

## 2018-12-22 MED ORDER — SODIUM CHLORIDE 0.9 % IV SOLN
INTRAVENOUS | Status: DC | PRN
  Administered 2018-12-22: 19:00:00 via INTRAVENOUS

## 2018-12-22 MED ORDER — BISACODYL 10 MG RE SUPP: 10.0000 mg | Freq: Every day | RECTAL | Status: DC | PRN

## 2018-12-22 MED ORDER — BUPIVACAINE-EPINEPHRINE (PF) 0.5% -1:200000 IJ SOLN
INTRAMUSCULAR | Status: AC
  Filled 2018-12-22: qty 30

## 2018-12-22 MED ORDER — LORAZEPAM 2 MG/ML IJ SOLN
INTRAMUSCULAR | Status: AC
  Administered 2018-12-22: 1 mg
  Filled 2018-12-22: qty 1

## 2018-12-22 MED ORDER — POLYETHYLENE GLYCOL 3350 17 G PO PACK: 17.0000 g | PACK | Freq: Every day | ORAL | Status: DC | PRN

## 2018-12-22 MED ORDER — POTASSIUM CHLORIDE IN NACL 20-0.9 MEQ/L-% IV SOLN
INTRAVENOUS | Status: DC
  Administered 2018-12-22 – 2018-12-27 (×5): via INTRAVENOUS
  Filled 2018-12-22 (×7): qty 1000

## 2018-12-22 MED ORDER — CEFAZOLIN SODIUM-DEXTROSE 1-4 GM/50ML-% IV SOLN
1.0000 g | Freq: Three times a day (TID) | INTRAVENOUS | Status: AC
  Administered 2018-12-23 (×2): 1 g via INTRAVENOUS
  Filled 2018-12-22 (×2): qty 50

## 2018-12-22 MED ORDER — 0.9 % SODIUM CHLORIDE (POUR BTL) OPTIME
TOPICAL | Status: DC | PRN
  Administered 2018-12-22 (×2): 1000 mL

## 2018-12-22 MED ORDER — SODIUM CHLORIDE 0.9 % IV SOLN
INTRAVENOUS | Status: DC | PRN
  Administered 2018-12-22: 15 ug/min via INTRAVENOUS

## 2018-12-22 MED ORDER — ORAL CARE MOUTH RINSE
15.0000 mL | OROMUCOSAL | Status: DC
  Administered 2018-12-23 (×6): 15 mL via OROMUCOSAL

## 2018-12-22 MED ORDER — LEVETIRACETAM IN NACL 1000 MG/100ML IV SOLN
1000.0000 mg | Freq: Once | INTRAVENOUS | Status: AC
  Administered 2018-12-22: 1000 mg via INTRAVENOUS
  Filled 2018-12-22: qty 100

## 2018-12-22 MED ORDER — ONDANSETRON HCL 4 MG/2ML IJ SOLN: 4.0000 mg | INTRAMUSCULAR | Status: DC | PRN

## 2018-12-22 MED ORDER — SODIUM CHLORIDE 0.9 % IV SOLN: INTRAVENOUS | Status: DC

## 2018-12-22 MED ORDER — FENTANYL CITRATE (PF) 100 MCG/2ML IJ SOLN
50.0000 ug | INTRAMUSCULAR | Status: DC | PRN
  Administered 2018-12-22 – 2018-12-23 (×6): 100 ug via INTRAVENOUS
  Filled 2018-12-22 (×2): qty 4
  Filled 2018-12-22: qty 2

## 2018-12-22 MED ORDER — ONDANSETRON HCL 4 MG PO TABS: 4.0000 mg | ORAL_TABLET | ORAL | Status: DC | PRN

## 2018-12-22 MED ORDER — LABETALOL HCL 5 MG/ML IV SOLN: 10.0000 mg | INTRAVENOUS | Status: DC | PRN

## 2018-12-22 MED ORDER — SODIUM CHLORIDE 0.9 % IV SOLN
10.0000 mL/h | Freq: Once | INTRAVENOUS | Status: AC
  Administered 2018-12-22: 10 mL/h via INTRAVENOUS

## 2018-12-22 MED ORDER — DOCUSATE SODIUM 100 MG PO CAPS
100.0000 mg | ORAL_CAPSULE | Freq: Two times a day (BID) | ORAL | Status: DC
  Administered 2018-12-23 – 2018-12-28 (×10): 100 mg via ORAL
  Filled 2018-12-22 (×10): qty 1

## 2018-12-22 MED ORDER — SODIUM CHLORIDE 0.9 % IV SOLN: 10.0000 mL/h | Freq: Once | INTRAVENOUS | Status: AC

## 2018-12-22 MED ORDER — CHLORHEXIDINE GLUCONATE CLOTH 2 % EX PADS
6.0000 | MEDICATED_PAD | Freq: Every day | CUTANEOUS | Status: DC
  Administered 2018-12-22: 6 via TOPICAL

## 2018-12-22 MED ORDER — VANCOMYCIN HCL 1000 MG IV SOLR
INTRAVENOUS | Status: DC | PRN
  Administered 2018-12-22: 1000 mg via INTRAVENOUS

## 2018-12-22 MED ORDER — BACITRACIN ZINC 500 UNIT/GM EX OINT
TOPICAL_OINTMENT | CUTANEOUS | Status: DC | PRN
  Administered 2018-12-22: 1 via TOPICAL

## 2018-12-22 MED ORDER — LIDOCAINE-EPINEPHRINE 1 %-1:100000 IJ SOLN
INTRAMUSCULAR | Status: DC | PRN
  Administered 2018-12-22: 10 mL via INTRADERMAL

## 2018-12-22 MED ORDER — LEVETIRACETAM IN NACL 500 MG/100ML IV SOLN
500.0000 mg | Freq: Two times a day (BID) | INTRAVENOUS | Status: DC
  Administered 2018-12-22 – 2018-12-24 (×5): 500 mg via INTRAVENOUS
  Filled 2018-12-22 (×5): qty 100

## 2018-12-22 MED ORDER — THROMBIN 20000 UNITS EX SOLR
CUTANEOUS | Status: AC
  Filled 2018-12-22: qty 20000

## 2018-12-22 MED ORDER — HYDROCODONE-ACETAMINOPHEN 5-325 MG PO TABS: 1.0000 | ORAL_TABLET | ORAL | Status: DC | PRN

## 2018-12-22 MED ORDER — SODIUM CHLORIDE 0.9% IV SOLUTION: Freq: Once | INTRAVENOUS | Status: AC

## 2018-12-22 MED ORDER — FLEET ENEMA 7-19 GM/118ML RE ENEM: 1.0000 | ENEMA | Freq: Once | RECTAL | Status: DC | PRN

## 2018-12-22 MED ORDER — SODIUM CHLORIDE 0.9 % IV SOLN
INTRAVENOUS | Status: DC
  Administered 2018-12-22: 17:00:00 via INTRAVENOUS

## 2018-12-22 MED ORDER — FENTANYL CITRATE (PF) 250 MCG/5ML IJ SOLN
INTRAMUSCULAR | Status: AC
  Filled 2018-12-22: qty 5

## 2018-12-22 MED ORDER — LIDOCAINE 2% (20 MG/ML) 5 ML SYRINGE
INTRAMUSCULAR | Status: DC | PRN
  Administered 2018-12-22: 20 mg via INTRAVENOUS

## 2018-12-22 MED ORDER — PROMETHAZINE HCL 25 MG PO TABS: 12.5000 mg | ORAL_TABLET | ORAL | Status: DC | PRN

## 2018-12-22 MED ORDER — FENTANYL CITRATE (PF) 250 MCG/5ML IJ SOLN
INTRAMUSCULAR | Status: DC | PRN
  Administered 2018-12-22: 100 ug via INTRAVENOUS
  Administered 2018-12-22 (×3): 50 ug via INTRAVENOUS

## 2018-12-22 MED ORDER — LIDOCAINE-EPINEPHRINE 1 %-1:100000 IJ SOLN
INTRAMUSCULAR | Status: AC
  Filled 2018-12-22: qty 1

## 2018-12-22 MED ORDER — THROMBIN 5000 UNITS EX SOLR
CUTANEOUS | Status: AC
  Filled 2018-12-22: qty 5000

## 2018-12-22 MED ORDER — SUGAMMADEX SODIUM 200 MG/2ML IV SOLN
INTRAVENOUS | Status: DC | PRN
  Administered 2018-12-22: 50 mg via INTRAVENOUS

## 2018-12-22 MED ORDER — PROPOFOL 10 MG/ML IV BOLUS
INTRAVENOUS | Status: DC | PRN
  Administered 2018-12-22: 100 mg via INTRAVENOUS

## 2018-12-22 MED ORDER — ONDANSETRON HCL 4 MG/2ML IJ SOLN
INTRAMUSCULAR | Status: DC | PRN
  Administered 2018-12-22: 4 mg via INTRAVENOUS

## 2018-12-22 MED ORDER — PANTOPRAZOLE SODIUM 40 MG IV SOLR: 40.0000 mg | Freq: Every day | INTRAVENOUS | Status: DC

## 2018-12-22 MED ORDER — PHENYLEPHRINE 40 MCG/ML (10ML) SYRINGE FOR IV PUSH (FOR BLOOD PRESSURE SUPPORT)
PREFILLED_SYRINGE | INTRAVENOUS | Status: DC | PRN
  Administered 2018-12-22 (×4): 40 ug via INTRAVENOUS

## 2018-12-22 MED ORDER — POTASSIUM CHLORIDE 2 MEQ/ML IV SOLN
INTRAVENOUS | Status: AC
  Administered 2018-12-22: 1000 mL via INTRAVENOUS
  Filled 2018-12-22: qty 1000

## 2018-12-22 MED ORDER — SUCCINYLCHOLINE CHLORIDE 200 MG/10ML IV SOSY
PREFILLED_SYRINGE | INTRAVENOUS | Status: DC | PRN
  Administered 2018-12-22: 120 mg via INTRAVENOUS

## 2018-12-22 MED ORDER — MORPHINE SULFATE (PF) 2 MG/ML IV SOLN
1.0000 mg | INTRAVENOUS | Status: DC | PRN
  Administered 2018-12-22: 2 mg via INTRAVENOUS
  Filled 2018-12-22 (×2): qty 1

## 2018-12-22 MED ORDER — ROCURONIUM BROMIDE 10 MG/ML (PF) SYRINGE
PREFILLED_SYRINGE | INTRAVENOUS | Status: DC | PRN
  Administered 2018-12-22: 40 mg via INTRAVENOUS
  Administered 2018-12-22: 10 mg via INTRAVENOUS

## 2018-12-22 MED ORDER — ACETAMINOPHEN 650 MG RE SUPP: 650.0000 mg | RECTAL | Status: DC | PRN

## 2018-12-22 MED ORDER — THROMBIN 20000 UNITS EX SOLR
CUTANEOUS | Status: DC | PRN
  Administered 2018-12-22: 20 mL via TOPICAL

## 2018-12-22 MED ORDER — ACETAMINOPHEN 325 MG PO TABS
650.0000 mg | ORAL_TABLET | ORAL | Status: DC | PRN
  Administered 2018-12-23 – 2018-12-28 (×11): 650 mg via ORAL
  Filled 2018-12-22 (×11): qty 2

## 2018-12-22 SURGICAL SUPPLY — 78 items
BASKET BONE COLLECTION (BASKET) IMPLANT
BIT DRILL WIRE PASS 1.3MM (BIT) IMPLANT
BNDG CMPR 75X41 PLY HI ABS (GAUZE/BANDAGES/DRESSINGS)
BNDG GAUZE ELAST 4 BULKY (GAUZE/BANDAGES/DRESSINGS) IMPLANT
BNDG STRETCH 4X75 STRL LF (GAUZE/BANDAGES/DRESSINGS) IMPLANT
BUR ACORN 6.0 PRECISION (BURR) ×2 IMPLANT
BUR SPIRAL ROUTER 2.3 (BUR) IMPLANT
CANISTER SUCT 3000ML PPV (MISCELLANEOUS) ×2 IMPLANT
CARTRIDGE OIL MAESTRO DRILL (MISCELLANEOUS) ×1 IMPLANT
CATH ROBINSON RED A/P 12FR (CATHETERS) IMPLANT
CLIP VESOCCLUDE MED 6/CT (CLIP) IMPLANT
COVER WAND RF STERILE (DRAPES) ×2 IMPLANT
DECANTER SPIKE VIAL GLASS SM (MISCELLANEOUS) ×2 IMPLANT
DIFFUSER DRILL AIR PNEUMATIC (MISCELLANEOUS) ×2 IMPLANT
DRAIN JACKSON PRATT 10MM FLAT (MISCELLANEOUS) ×1 IMPLANT
DRAIN PENROSE 1/2X12 LTX STRL (WOUND CARE) IMPLANT
DRAPE NEUROLOGICAL W/INCISE (DRAPES) ×2 IMPLANT
DRAPE WARM FLUID 44X44 (DRAPES) ×2 IMPLANT
DRILL WIRE PASS 1.3MM (BIT)
DRSG OPSITE 4X5.5 SM (GAUZE/BANDAGES/DRESSINGS) ×2 IMPLANT
DRSG PAD ABDOMINAL 8X10 ST (GAUZE/BANDAGES/DRESSINGS) IMPLANT
DRSG TELFA 3X8 NADH (GAUZE/BANDAGES/DRESSINGS) ×2 IMPLANT
DURAMATRIX ONLAY 3X3 (Plate) ×1 IMPLANT
DURAPREP 6ML APPLICATOR 50/CS (WOUND CARE) ×2 IMPLANT
ELECT REM PT RETURN 9FT ADLT (ELECTROSURGICAL) ×2
ELECTRODE REM PT RTRN 9FT ADLT (ELECTROSURGICAL) ×1 IMPLANT
EVACUATOR SILICONE 100CC (DRAIN) ×1 IMPLANT
GAUZE 4X4 16PLY RFD (DISPOSABLE) IMPLANT
GAUZE SPONGE 4X4 12PLY STRL (GAUZE/BANDAGES/DRESSINGS) IMPLANT
GLOVE BIO SURGEON STRL SZ 6.5 (GLOVE) ×1 IMPLANT
GLOVE BIO SURGEON STRL SZ7 (GLOVE) ×2 IMPLANT
GLOVE BIO SURGEON STRL SZ8 (GLOVE) ×2 IMPLANT
GLOVE BIOGEL PI IND STRL 7.5 (GLOVE) IMPLANT
GLOVE BIOGEL PI IND STRL 8 (GLOVE) ×1 IMPLANT
GLOVE BIOGEL PI IND STRL 8.5 (GLOVE) ×1 IMPLANT
GLOVE BIOGEL PI INDICATOR 7.5 (GLOVE) ×1
GLOVE BIOGEL PI INDICATOR 8 (GLOVE) ×1
GLOVE BIOGEL PI INDICATOR 8.5 (GLOVE) ×1
GLOVE ECLIPSE 7.0 STRL STRAW (GLOVE) ×1 IMPLANT
GLOVE ECLIPSE 8.0 STRL XLNG CF (GLOVE) ×2 IMPLANT
GLOVE EXAM NITRILE XL STR (GLOVE) IMPLANT
GOWN STRL REUS W/ TWL LRG LVL3 (GOWN DISPOSABLE) IMPLANT
GOWN STRL REUS W/ TWL XL LVL3 (GOWN DISPOSABLE) IMPLANT
GOWN STRL REUS W/TWL 2XL LVL3 (GOWN DISPOSABLE) IMPLANT
GOWN STRL REUS W/TWL LRG LVL3 (GOWN DISPOSABLE)
GOWN STRL REUS W/TWL XL LVL3 (GOWN DISPOSABLE)
HEMOSTAT SURGICEL 2X14 (HEMOSTASIS) ×2 IMPLANT
KIT BASIN OR (CUSTOM PROCEDURE TRAY) ×2 IMPLANT
KIT TURNOVER KIT B (KITS) ×2 IMPLANT
NDL HYPO 25X1 1.5 SAFETY (NEEDLE) ×1 IMPLANT
NEEDLE HYPO 25X1 1.5 SAFETY (NEEDLE) ×2 IMPLANT
NS IRRIG 1000ML POUR BTL (IV SOLUTION) ×2 IMPLANT
OIL CARTRIDGE MAESTRO DRILL (MISCELLANEOUS) ×2
PACK CRANIOTOMY CUSTOM (CUSTOM PROCEDURE TRAY) ×2 IMPLANT
PAD ARMBOARD 7.5X6 YLW CONV (MISCELLANEOUS) ×2 IMPLANT
PAD DRESSING TELFA 3X8 NADH (GAUZE/BANDAGES/DRESSINGS) IMPLANT
PATTIES SURGICAL .5 X.5 (GAUZE/BANDAGES/DRESSINGS) IMPLANT
PATTIES SURGICAL .5 X3 (DISPOSABLE) IMPLANT
PATTIES SURGICAL 1X1 (DISPOSABLE) IMPLANT
PIN MAYFIELD SKULL DISP (PIN) IMPLANT
PLATE 1.5  2HOLE LNG NEURO (Plate) ×4 IMPLANT
PLATE 1.5 2HOLE LNG NEURO (Plate) IMPLANT
SCREW SELF DRILL HT 1.5/4MM (Screw) ×8 IMPLANT
SPECIMEN JAR SMALL (MISCELLANEOUS) IMPLANT
SPONGE NEURO XRAY DETECT 1X3 (DISPOSABLE) IMPLANT
SPONGE SURGIFOAM ABS GEL 100 (HEMOSTASIS) IMPLANT
STAPLER SKIN PROX WIDE 3.9 (STAPLE) ×2 IMPLANT
SUT ETHILON 3 0 PS 1 (SUTURE) IMPLANT
SUT NURALON 4 0 TR CR/8 (SUTURE) ×4 IMPLANT
SUT VIC AB 2-0 CP2 18 (SUTURE) ×4 IMPLANT
SUT VIC AB 3-0 SH 8-18 (SUTURE) IMPLANT
SYR CONTROL 10ML LL (SYRINGE) IMPLANT
TAPE CLOTH SURG 4X10 WHT LF (GAUZE/BANDAGES/DRESSINGS) ×1 IMPLANT
TOWEL GREEN STERILE (TOWEL DISPOSABLE) ×2 IMPLANT
TOWEL GREEN STERILE FF (TOWEL DISPOSABLE) ×2 IMPLANT
TRAY FOLEY MTR SLVR 16FR STAT (SET/KITS/TRAYS/PACK) IMPLANT
UNDERPAD 30X30 (UNDERPADS AND DIAPERS) IMPLANT
WATER STERILE IRR 1000ML POUR (IV SOLUTION) ×2 IMPLANT

## 2018-12-22 NOTE — ED Notes (Signed)
Pt contact:  Michelle Schneider [phone]

## 2018-12-22 NOTE — Op Note (Signed)
12/22/2018  8:40 PM  PATIENT:  Michelle Schneider  58 y.o. 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 She has cirrhosis and coagulopathy with low platelets.  The patient presents with large subdural and aphasia.  Head CT shows panhemispheric left SDH with mass effect and early shift.It was elected to take patient to surgery for craniotomy for SDH.  Procedure:  Following smooth intubation, patient was placed in right semi-lateral position with blanket roll.  Head was placed on donut head holder and left frontal scalp was shaved and prepped and draped in usual sterile fashion.  Area of planned incision was infiltrated with lidocaine. A curvilinear incision was made and carried through temporalis fascia and muscle to expose calvarium.  Skull flap was elevated exposing subdural hematoma.  Dura was opened and subdural was evacuated.  This was acute blood and under pressure.   This was evacuated and a considerable amount of blood was removed. The brain was irrigated.    Hemostasis was assured. Surgifoam was used to help achieve hemostasis.  The brain came back up after hematoma evacuation.  The dura was patched with a dural graft,  bone flap was replaced with  plates, a subgaleal #10 JP drain was placed.  The fascia and galea were closed with 2-0 vicryl sutures and the skin was re approximated with staples.  A sterile occlusive dressing was placed.  Patient was extubated and taken to ICU in stable condition having tolerated her operation well.   PLAN OF CARE: Admit to inpatient   PATIENT DISPOSITION:  PACU - hemodynamically stable.   Delay start of Pharmacological VTE agent (>24hrs) due to surgical blood loss or risk of bleeding: yes  

## 2018-12-22 NOTE — ED Notes (Signed)
Notified MD, Zammit of patients systolic bp being >009 (see vitals). Awaiting new orders.

## 2018-12-22 NOTE — H&P (Signed)
Reason for Consult:SDH Referring Physician: Elson Clan, [place]  Michelle Schneider is an 58 y.o. female.  HPI: Michelle Schneider is a 58 y.o. female past medical story of autoimmune hepatitis, cirrhosis, esophageal varices, hyperlipidemia who presents for evaluation of unwitnessed fall.  Patient arrives with her brother's caregiver who provides most of the history.  Caregiver is there during the day and then sister takes care of brother during the night.  Patient was last seen normal Monday afternoon.  When caregiver arrived on Tuesday morning, she saw that the phone was on the bottom of the steps and patient was lying in bed with some bruising.  Patient was unsure of what happened and then later told the caregiver that she fell down the steps but was unsure exactly how it happened.  Caregiver reported that she initially was at her baseline but did notice some swelling around the right orbit.  She reports that she saw her yesterday and noticed bruising around the left orbit that started spreading down the right side of the face and right neck.  Today when she came, patient was confused was having difficulty speaking and was off balance.  Caregiver reports that at baseline, patient is alert and oriented x4 and is able to function independently.  Caregiver does not believe that patient is on blood thinners though she does state that she has history of liver issues.   Past Medical History:  Diagnosis Date  . Autoimmune hepatitis (HCC)   . Cirrhosis (HCC)   . Esophageal varices (HCC)   . Family history of adverse reaction to anesthesia    cousin had a difficult time waking up after appendectomy  . Heart murmur    "diagnosed with a very slight heart murmur in 2001 but no other doctor has been able to hear it"  . Hyperlipidemia   . Raynaud's disease     Past Surgical History:  Procedure Laterality Date  . ABDOMINAL HYSTERECTOMY    . ESOPHAGOGASTRODUODENOSCOPY (EGD) WITH PROPOFOL N/A 06/15/2017   Procedure:  ESOPHAGOGASTRODUODENOSCOPY (EGD) WITH PROPOFOL;  Surgeon: Benancio Deeds, MD;  Location: [facility];  Service: Gastroenterology;  Laterality: N/A;  . ORIF HUMERUS FRACTURE Right 05/23/2015   Procedure: OPEN REDUCTION INTERNAL FIXATION (ORIF) RIGHT  PROXIMAL HUMERUS FRACTURE;  Surgeon: Francena Hanly, MD;  Location: [facility] OR;  Service: Orthopedics;  Laterality: Right;  . WISDOM TOOTH EXTRACTION      Family History  Problem Relation Age of Onset  . Dementia Father   . Parkinson's disease Mother   . Stroke Brother   . Colon cancer Neg Hx   . Esophageal cancer Neg Hx   . Pancreatic cancer Neg Hx   . Rectal cancer Neg Hx   . Stomach cancer Neg Hx     Social History:  reports that she has never smoked. She has never used smokeless tobacco. She reports current alcohol use. She reports that she does not use drugs.  Allergies:  Allergies  Allergen Reactions  . Oxycodone-Acetaminophen Nausea And Vomiting and Other (See Comments)    Hallucinations and bad dreams    Medications: I have reviewed the patient's current medications.  Results for orders placed or performed during the hospital encounter of 12/22/18 (from the past 48 hour(s))  CBC with Differential     Status: None   Collection Time: 12/22/18 12:45 PM  Result Value Ref Range   WBC SPECIMEN CLOTTED 4.0 - 10.5 K/uL    Comment: CORRECTED RESULTS CALLED TO: Tawana Scale RN AT 1316 ON  [id number] BY K FORSYTH CORRECTED ON 10/08 AT 1319: PREVIOUSLY REPORTED AS 4.2    RBC SPECIMEN CLOTTED 3.87 - 5.11 MIL/uL    Comment: CORRECTED RESULTS CALLED TO: Tawana Scale RN AT 1316 ON [id number] BY K FORSYTH CORRECTED ON 10/08 AT 1319: PREVIOUSLY REPORTED AS 2.98    Hemoglobin SPECIMEN CLOTTED 12.0 - 15.0 g/dL    Comment: CORRECTED RESULTS CALLED TO: Tawana Scale RN AT 1316 ON [id number] BY K FORSYTH CORRECTED ON 10/08 AT 1319: PREVIOUSLY REPORTED AS 11.0    HCT SPECIMEN CLOTTED 36.0 - 46.0 %    Comment: CORRECTED RESULTS CALLED TO: Tawana Scale RN AT 1316  ON [id number] BY K FORSYTH CORRECTED ON 10/08 AT 1319: PREVIOUSLY REPORTED AS 30.6    MCV SPECIMEN CLOTTED 80.0 - 100.0 fL    Comment: CORRECTED RESULTS CALLED TO: Tawana Scale RN AT 1316 ON [id number] BY K FORSYTH CORRECTED ON 10/08 AT 1319: PREVIOUSLY REPORTED AS 102.7    MCH SPECIMEN CLOTTED 26.0 - 34.0 pg    Comment: CORRECTED RESULTS CALLED TO: Tawana Scale RN AT 1316 ON [id number] BY K FORSYTH CORRECTED ON 10/08 AT 1319: PREVIOUSLY REPORTED AS 36.9    MCHC SPECIMEN CLOTTED 30.0 - 36.0 g/dL    Comment: CORRECTED RESULTS CALLED TOTawana Scale RN AT 1316 ON [id number] BY K FORSYTH CORRECTED ON 10/08 AT 1319: PREVIOUSLY REPORTED AS 35.9    RDW SPECIMEN CLOTTED 11.5 - 15.5 %    Comment: CORRECTED RESULTS CALLED TOTawana Scale RN AT 1316 ON [id number] BY K FORSYTH CORRECTED ON 10/08 AT 1319: PREVIOUSLY REPORTED AS 13.5    Platelets SPECIMEN CLOTTED 150 - 400 K/uL   nRBC SPECIMEN CLOTTED 0.0 - 0.2 %    Comment: CORRECTED RESULTS CALLED TOTawana Scale RN AT 1316 ON [id number] BY K FORSYTH Performed at [facility] Lab, [address] [place], [place] [address] CORRECTED ON 10/08 AT 1319: PREVIOUSLY REPORTED AS 0.5    Neutrophils Relative % SPECIMEN CLOTTED %   Neutro Abs SPECIMEN CLOTTED 1.7 - 7.7 K/uL   Band Neutrophils SPECIMEN CLOTTED %   Lymphocytes Relative SPECIMEN CLOTTED %   Lymphs Abs SPECIMEN CLOTTED 0.7 - 4.0 K/uL   Monocytes Relative SPECIMEN CLOTTED %   Monocytes Absolute SPECIMEN CLOTTED 0.1 - 1.0 K/uL   Eosinophils Relative SPECIMEN CLOTTED %   Eosinophils Absolute SPECIMEN CLOTTED 0.0 - 0.5 K/uL   Basophils Relative SPECIMEN CLOTTED %   Basophils Absolute SPECIMEN CLOTTED 0.0 - 0.1 K/uL   WBC Morphology SPECIMEN CLOTTED    RBC Morphology SPECIMEN CLOTTED    Smear Review SPECIMEN CLOTTED    Other SPECIMEN CLOTTED %   nRBC SPECIMEN CLOTTED 0 /100 WBC   Metamyelocytes Relative SPECIMEN CLOTTED %   Myelocytes SPECIMEN CLOTTED %   Promyelocytes Relative SPECIMEN CLOTTED %   Blasts  SPECIMEN CLOTTED %   Immature Granulocytes SPECIMEN CLOTTED %   Abs Immature Granulocytes SPECIMEN CLOTTED 0.00 - 0.07 K/uL  Comprehensive metabolic panel     Status: Abnormal   Collection Time: 12/22/18 12:45 PM  Result Value Ref Range   Sodium 129 (L) 135 - 145 mmol/L   Potassium 3.2 (L) 3.5 - 5.1 mmol/L   Chloride 92 (L) 98 - 111 mmol/L   CO2 24 22 - 32 mmol/L   Glucose, Bld 122 (H) 70 - 99 mg/dL   BUN 10 6 - 20 mg/dL   Creatinine, Ser 6.60 0.44 - 1.00 mg/dL   Calcium 9.6 8.9 - 63.0 mg/dL  Total Protein 8.2 (H) 6.5 - 8.1 g/dL   Albumin 4.1 3.5 - 5.0 g/dL   AST 64 (H) 15 - 41 U/L   ALT 28 0 - 44 U/L   Alkaline Phosphatase 69 38 - 126 U/L   Total Bilirubin 6.9 (H) 0.3 - 1.2 mg/dL   GFR calc non Af Amer >60 >60 mL/min   GFR calc Af Amer >60 >60 mL/min   Anion gap 13 5 - 15    Comment: Performed at [facility] Lab, [address] [place], [place] [address]  Protime-INR     Status: Abnormal   Collection Time: 12/22/18 12:45 PM  Result Value Ref Range   Prothrombin Time 18.5 (H) 11.4 - 15.2 seconds   INR 1.6 (H) 0.8 - 1.2    Comment: (NOTE) INR goal varies based on device and disease states. Performed at [facility] Lab, [address] [place], [place] [address]   CBC with Differential/Platelet     Status: Abnormal   Collection Time: 12/22/18  1:55 PM  Result Value Ref Range   WBC 4.7 4.0 - 10.5 K/uL   RBC 2.89 (L) 3.87 - 5.11 MIL/uL   Hemoglobin 10.5 (L) 12.0 - 15.0 g/dL   HCT 09.8 (L) 11.9 - 14.7 %   MCV 102.8 (H) 80.0 - 100.0 fL   MCH 36.3 (H) 26.0 - 34.0 pg   MCHC 35.4 30.0 - 36.0 g/dL   RDW 82.9 56.2 - 13.0 %   Platelets 52 (L) 150 - 400 K/uL    Comment: REPEATED TO VERIFY PLATELET COUNT CONFIRMED BY SMEAR Immature Platelet Fraction may be clinically indicated, consider ordering this additional test [id number]    nRBC 0.0 0.0 - 0.2 %   Neutrophils Relative % 60 %   Neutro Abs 2.9 1.7 - 7.7 K/uL   Lymphocytes Relative 20 %   Lymphs Abs 0.9 0.7 - 4.0  K/uL   Monocytes Relative 19 %   Monocytes Absolute 0.9 0.1 - 1.0 K/uL   Eosinophils Relative 0 %   Eosinophils Absolute 0.0 0.0 - 0.5 K/uL   Basophils Relative 1 %   Basophils Absolute 0.0 0.0 - 0.1 K/uL   Immature Granulocytes 0 %   Abs Immature Granulocytes 0.02 0.00 - 0.07 K/uL    Comment: Performed at [facility] Lab, [address] [place], [place] [address]  Type and screen [facility]     Status: None (Preliminary result)   Collection Time: 12/22/18  1:58 PM  Result Value Ref Range   ABO/RH(D) A NEG    Antibody Screen PENDING    Sample Expiration      12/25/2018,2359 Performed at [facility] Lab, [address], [place], [place] [address]   Prepare fresh frozen plasma     Status: None (Preliminary result)   Collection Time: 12/22/18  2:30 PM  Result Value Ref Range   Unit Number [id number]    Blood Component Type THAWED PLASMA    Unit division 00    Status of Unit ISSUED    Unit tag comment VERBAL ORDERS PER DR ZAMMIT    Transfusion Status      OK TO TRANSFUSE Performed at [facility], [address], [place], [place] [address]    Unit Number [id number]    Blood Component Type THAWED PLASMA    Unit division 00    Status of Unit ISSUED    Unit tag comment VERBAL ORDERS PER DR ZAMMIT  Transfusion Status OK TO TRANSFUSE   Prepare fresh frozen plasma     Status: None (Preliminary result)   Collection Time: 12/22/18  3:30 PM  Result Value Ref Range   Unit Number [id number]    Blood Component Type THAWED PLASMA    Unit division 00    Status of Unit ALLOCATED    Transfusion Status OK TO TRANSFUSE    Unit Number [id number]    Blood Component Type THAWED PLASMA    Unit division 00    Status of Unit ALLOCATED    Transfusion Status OK TO TRANSFUSE     Ct Head Wo Contrast  Addendum Date: 12/22/2018   ADDENDUM REPORT: 12/22/2018 12:43 ADDENDUM: Critical Value/emergent results were called by telephone at the time of  interpretation on 12/22/2018 at 1234 hours to provider Dr. Varney Biles , who verbally acknowledged these results. Electronically Signed   By: Genevie Ann M.D.   On: 12/22/2018 12:43   Result Date: 12/22/2018 CLINICAL DATA:  58 year old female status post fall at home with hematoma about the right eye. Neck bruising. EXAM: CT HEAD WITHOUT CONTRAST TECHNIQUE: Contiguous axial images were obtained from the base of the skull through the vertex without intravenous contrast. COMPARISON:  None. FINDINGS: Brain: Mixed density but mostly hyperdense subdural hematoma tracking throughout the left hemisphere ranging from 7-10 millimeters in thickness. Relatively mild mass effect on the left hemisphere with rightward midline shift of 3 millimeters. Small volume of blood layering on the left tentorium. Trace blood also along the falx. Superimposed small hemorrhagic contusions in the anterior anterior left frontal lobe (sagittal image 32) and the superior left temporal gyrus (image 13), the latter is larger measuring 13 millimeters in diameter with mild regional edema. No intraventricular hemorrhage or ventriculomegaly. Basilar cisterns remain patent. No right side or subarachnoid hemorrhage. No superimposed No cortically based acute infarct identified. Vascular: No suspicious intracranial vascular hyperdensity. Skull: No fracture identified. Sinuses/Orbits: Visualized paranasal sinuses and mastoids are clear. Other: Broad-based posterior scalp hematoma, eccentric to the right. No underlying calvarium fracture identified. Partially visible right inferior and lateral periorbital scalp and face hematoma/contusion. The right globe and other intraorbital soft tissues remain normal. IMPRESSION: 1. Positive for posttraumatic intracranial hemorrhage: - left hemisphere Subdural Hematoma up to 10 mm thickness. - at least 3 small hemorrhagic contusions in the anterior left frontal and superior left temporal lobes. - no subarachnoid or  intraventricular hemorrhage identified. 2. Rightward midline shift of 3 millimeters. Basilar cisterns remain patent. 3. Large scalp hematoma but no skull fracture identified. Electronically Signed: By: Genevie Ann M.D. On: 12/22/2018 12:31   Ct Cervical Spine Wo Contrast  Result Date: 12/22/2018 CLINICAL DATA:  58 year old female status post fall at home with hematoma about the right eye. Neck bruising. EXAM: CT CERVICAL SPINE WITHOUT CONTRAST TECHNIQUE: Multidetector CT imaging of the cervical spine was performed without intravenous contrast. Multiplanar CT image reconstructions were also generated. COMPARISON:  Head CT today reported separately. FINDINGS: Alignment: Straightening of cervical lordosis. Cervicothoracic junction alignment is within normal limits. Bilateral posterior element alignment is within normal limits. Skull base and vertebrae: Visualized skull base is intact. No atlanto-occipital dissociation. No acute osseous abnormality identified. No acute osseous abnormality identified. Soft tissues and spinal canal: No prevertebral fluid or swelling. No visible canal hematoma. Left side subdural hematoma redemonstrated in the visible lower brain. Confluent soft tissue stranding along the superficial right lateral neck on series 12, image 118 is compatible with posttraumatic hematoma or contusion. Negative left-side noncontrast neck  soft tissues. Disc levels: Age-appropriate cervical spine degeneration. No spinal stenosis suspected. Upper chest: Visible upper thoracic levels appear intact. Negative lung apices and noncontrast thoracic inlet. IMPRESSION: 1. Superficial right neck soft tissue contusion/hematoma. 2. No acute traumatic injury identified in the cervical spine. 3. See also Head CT, reported separately. Electronically Signed   By: Odessa FlemingH  Hall M.D.   On: 12/22/2018 12:38   Dg Pelvis Portable  Result Date: 12/22/2018 CLINICAL DATA:  Fall. EXAM: PORTABLE PELVIS 1-2 VIEWS COMPARISON:  None. FINDINGS:  There is no evidence of pelvic fracture or diastasis. No pelvic bone lesions are seen. IMPRESSION: Negative. Electronically Signed   By: Lupita RaiderJames  Green Jr M.D.   On: 12/22/2018 13:15   Dg Chest Portable 1 View  Result Date: 12/22/2018 CLINICAL DATA:  Larey SeatFell today.  Altered mental status. EXAM: PORTABLE CHEST 1 VIEW COMPARISON:  None. FINDINGS: The cardiac silhouette, mediastinal and hilar contours are within normal limits. The lungs are clear. Mild eventration of the right hemidiaphragm. No pleural effusions. No pneumothorax. The bony thorax is intact. No definite rib fractures. Remote right shoulder hardware. IMPRESSION: No acute cardiopulmonary findings and intact bony thorax. Electronically Signed   By: Rudie MeyerP.  Gallerani M.D.   On: 12/22/2018 13:15    Review of Systems - Negative except per HPI    Blood pressure (!) 135/105, pulse 89, temperature 99 F (37.2 C), temperature source Oral, resp. rate (!) 23, height 5' (1.524 m), weight 44 kg, SpO2 99 %. Physical Exam  Constitutional: She appears well-developed and well-nourished.  HENT:  Head: Normocephalic. Head is with raccoon's eyes.    Eyes: Pupils are equal, round, and reactive to light. EOM are normal.  Neck: Normal range of motion. Neck supple.  Neurological: She is alert. She is disoriented. No cranial nerve deficit. GCS eye subscore is 4. GCS verbal subscore is 3. GCS motor subscore is 5.  Patient is aphasic.  She speaks with garbled, incomprehensible speech.  She does not follow commands.  She moves all extremities spontaneously and to noxious stimuli.    Assessment/Plan: Patient with cirrhosis and elevated INR with large acute left subdural hematoma after a fall with multiple brain contusions.  She has had seizures and is aphasic.  I spoke with patient's caregiver, as she has no immediate family (other than brother, who has had CVA and requires full time care).  Caregiver indicates patient would wish to have surgery.  I advised that we  would give FFP and attempt to control bleeding, but if blood re-accumulates, I would recommend against repeat surgery.    Dorian HeckleJoseph D Stern, MD 12/22/2018, 3:19 PM

## 2018-12-22 NOTE — Anesthesia Preprocedure Evaluation (Addendum)
Anesthesia Evaluation  Patient identified by MRN, date of birth, ID band Patient awake    Reviewed: Allergy & Precautions, NPO status , Patient's Chart, lab work & pertinent test results  Airway Mallampati: I  TM Distance: >3 FB Neck ROM: Full    Dental  (+) Teeth Intact, Dental Advisory Given   Pulmonary    Pulmonary exam normal breath sounds clear to auscultation       Cardiovascular Normal cardiovascular exam Rhythm:Regular Rate:Normal     Neuro/Psych    GI/Hepatic (+) Hepatitis -  Endo/Other    Renal/GU      Musculoskeletal   Abdominal   Peds  Hematology   Anesthesia Other Findings   Reproductive/Obstetrics                            Anesthesia Physical Anesthesia Plan  ASA: III and emergent  Anesthesia Plan: General   Post-op Pain Management:    Induction: Intravenous, Rapid sequence and Cricoid pressure planned  PONV Risk Score and Plan: 3 and Dexamethasone, Ondansetron and Treatment may vary due to age or medical condition  Airway Management Planned: Oral ETT  Additional Equipment:   Intra-op Plan:   Post-operative Plan: Extubation in OR  Informed Consent: I have reviewed the patients History and Physical, chart, labs and discussed the procedure including the risks, benefits and alternatives for the proposed anesthesia with the patient or authorized representative who has indicated his/her understanding and acceptance.       Plan Discussed with: CRNA and Surgeon  Anesthesia Plan Comments:         Anesthesia Quick Evaluation

## 2018-12-22 NOTE — H&P (Signed)
PULMONARY / CRITICAL CARE MEDICINE   NAME:  Michelle Schneider, MRN:  [id number], DOB:  1960-03-17, LOS: 0 ADMISSION DATE:  12/22/2018, CONSULTATION DATE:  12/22/2018 REFERRING MD:  Dr. Lilyan Schneider, CHIEF COMPLAINT:  Headache  BRIEF HISTORY:    58 yo with SDH after falling at home.   Pt confused and not able to provide history.  HISTORY OF PRESENT ILLNESS   58 yo female with hx of autoimmune hepatitis with cirrhosis fell at home few days ago.  Caregiver for her brother noticed more confusion and difficulty with speech.  Brought to ER.  Found to have SDH with midline shift.  Seen by neurosurgery and GI.  Plan for OR.  SIGNIFICANT PAST MEDICAL HISTORY   Raynauds, HLD, Autoimmune hepatitis with cirrhosis  SIGNIFICANT EVENTS:  10/8 Admit, to OR  STUDIES:   CT head 10/8 >> Lt SDH with 3 mm Rt to Lt shift, large scalp hematoma  CULTURES:    ANTIBIOTICS:    LINES/TUBES:    CONSULTANTS:  Neurosurgery GI  SUBJECTIVE:    CONSTITUTIONAL: BP (!) 144/88   Pulse 100   Temp 99.3 F (37.4 C) (Oral)   Resp 19   Ht 5' (1.524 m)   Wt 44 kg   SpO2 100%   BMI 18.94 kg/m   No intake/output data recorded.        PHYSICAL EXAM:  General - alert Eyes - periorbital hematoma on Rt ENT - no sinus tenderness, no stridor Cardiac - regular rate/rhythm, no murmur Chest - equal breath sounds b/l, no wheezing or rales Abdomen - soft, non tender, + bowel sounds Extremities - no cyanosis, clubbing, or edema Skin - no rashes Neuro - confused, speech incoherent, moves extremities   RESOLVED PROBLEM LIST   ASSESSMENT AND PLAN    Traumatic SDH. - to OR with neurosurgery  Hx of autoimmune hepatitis. - GI consulted  Coagulopathy. - received FFP prior to OR trip  Thrombocytopenia in setting of cirrhosis. - f/u CBC  Hyponatremia. - NS IV fluid - f/u BMET  Hypokalemia. - f/u BMET   SUMMARY OF TODAY'S PLAN:    Best Practice / Goals of Care / Disposition.   DVT PROPHYLAXIS:  SCDs SUP: Protonix NUTRITION: NPO MOBILITY: Bed rest GOALS OF CARE: Full code DISPOSITION ICU  LABS  Glucose No results for input(s): GLUCAP in the last 168 hours.  BMET Recent Labs  Lab 12/22/18 1245  NA 129*  K 3.2*  CL 92*  CO2 24  BUN 10  CREATININE 0.54  GLUCOSE 122*    Liver Enzymes Recent Labs  Lab 12/22/18 1245  AST 64*  ALT 28  ALKPHOS 69  BILITOT 6.9*  ALBUMIN 4.1    Electrolytes Recent Labs  Lab 12/22/18 1245  CALCIUM 9.6    CBC Recent Labs  Lab 12/22/18 1245 12/22/18 1355  WBC SPECIMEN CLOTTED 4.7  HGB SPECIMEN CLOTTED 10.5*  HCT SPECIMEN CLOTTED 29.7*  PLT SPECIMEN CLOTTED 52*    ABG No results for input(s): PHART, PCO2ART, PO2ART in the last 168 hours.  Coag's Recent Labs  Lab 12/22/18 1245  INR 1.6*    Sepsis Markers No results for input(s): LATICACIDVEN, PROCALCITON, O2SATVEN in the last 168 hours.  Cardiac Enzymes No results for input(s): TROPONINI, PROBNP in the last 168 hours.  PAST MEDICAL HISTORY :   She  has a past medical history of Autoimmune hepatitis ([place]), Cirrhosis ([place]), Esophageal varices ([place]), Family history of adverse reaction to anesthesia, Heart murmur, Hyperlipidemia, and Raynaud's disease.  PAST SURGICAL HISTORY:  She  has a past surgical history that includes Abdominal hysterectomy; ORIF humerus fracture (Right, 05/23/2015); Wisdom tooth extraction; and Esophagogastroduodenoscopy (egd) with propofol (N/A, 06/15/2017).  Allergies  Allergen Reactions  . Oxycodone-Acetaminophen Nausea And Vomiting and Other (See Comments)    Hallucinations and bad dreams    No current facility-administered medications on file prior to encounter.    Current Outpatient Medications on File Prior to Encounter  Medication Sig  . magnesium gluconate (MAGONATE) 500 MG tablet Take 500 mg by mouth daily as needed (for muscle spasms).   . NON FORMULARY Place 2 drops under the tongue daily. CBD Oil Doterra  . OVER THE COUNTER  MEDICATION Take 3 tablets by mouth 2 (two) times daily. Doterra Lifelong Vitality Pack  . OVER THE COUNTER MEDICATION Take 1 tablet by mouth 2 (two) times daily. Bone Nutrient Supplement    FAMILY HISTORY:   Her family history includes Dementia in her father; Parkinson's disease in her mother; Stroke in her brother. There is no history of Colon cancer, Esophageal cancer, Pancreatic cancer, Rectal cancer, or Stomach cancer.  SOCIAL HISTORY:  She  reports that she has never smoked. She has never used smokeless tobacco. She reports current alcohol use. She reports that she does not use drugs.  REVIEW OF SYSTEMS:    Unable to obtain  Michelle Helling, MD [facility] Pulmonary/Critical Care 12/22/2018, 6:23 PM

## 2018-12-22 NOTE — ED Triage Notes (Signed)
Pt arrives POV for eval of fall 2 days ago w/ large hematoma to R eye, R ear and R neck. Caregiver denies blood thinners, but reports that she is increasingly confused, off balance and altered. PT denies pain

## 2018-12-22 NOTE — ED Provider Notes (Signed)
[facility]   CSN: [id number] Arrival date & time: 12/22/18  1147     History   Chief Complaint Chief Complaint  Patient presents with   Fall    HPI Michelle Schneider is a 58 y.o. female past medical story of autoimmune hepatitis, cirrhosis, esophageal varices, hyperlipidemia who presents for evaluation of unwitnessed fall.  Patient arrives with her brother's caregiver who provides most of the history.  Caregiver is there during the day and then sister takes care of brother during the night.  Patient was last seen normal Monday afternoon.  When caregiver arrived on Tuesday morning, she saw that the phone was on the bottom of the steps and patient was lying in bed with some bruising.  Patient was unsure of what happened and then later told the caregiver that she fell down the steps but was unsure exactly how it happened.  Caregiver reported that she initially was at her baseline but did notice some swelling around the right orbit.  She reports that she saw her yesterday and noticed bruising around the left orbit that started spreading down the right side of the face and right neck.  Today when she came, patient was confused was having difficulty speaking and was off balance.  Caregiver reports that at baseline, patient is alert and oriented x4 and is able to function independently.  Caregiver does not believe that patient is on blood thinners though she does state that she has history of liver issues.   EM LEVEL 5 CAVEAT DUE TO AMS     The history is provided by a caregiver.    Past Medical History:  Diagnosis Date   Autoimmune hepatitis (HCC)    Cirrhosis (HCC)    Esophageal varices (HCC)    Family history of adverse reaction to anesthesia    cousin had a difficult time waking up after appendectomy   Heart murmur    "diagnosed with a very slight heart murmur in 2001 but no other doctor has been able to hear it"    Hyperlipidemia    Raynaud's disease     Patient Active Problem List   Diagnosis Date Noted   Subdural hematoma (HCC) 12/22/2018   Autoimmune hepatitis (HCC) 11/07/2015   Hypoxia 08/24/2015   Pleural effusion 08/24/2015   Elevated bilirubin 08/24/2015   Liver cirrhosis (HCC) 08/24/2015   Elevated INR 08/24/2015   Anemia 08/24/2015   Cirrhosis of liver with ascites (HCC)    Ascites 08/23/2015    Past Surgical History:  Procedure Laterality Date   ABDOMINAL HYSTERECTOMY     ESOPHAGOGASTRODUODENOSCOPY (EGD) WITH PROPOFOL N/A 06/15/2017   Procedure: ESOPHAGOGASTRODUODENOSCOPY (EGD) WITH PROPOFOL;  Surgeon: Benancio DeedsArmbruster, Steven P, MD;  Location: [facility] END[facility]COPY;  Service: Gastroenterology;  Laterality: N/A;   ORIF HUMERUS FRACTURE Right 05/23/2015   Procedure: OPEN REDUCTION INTERNAL FIXATION (ORIF) RIGHT  PROXIMAL HUMERUS FRACTURE;  Surgeon: Francena HanlyKevin Supple, MD;  Location: [facility] OR;  Service: Orthopedics;  Laterality: Right;   WISDOM TOOTH EXTRACTION       OB History   No obstetric history on file.      Home Medications    Prior to Admission medications   Medication Sig Start Date End Date Taking? Authorizing Provider  magnesium gluconate (MAGONATE) 500 MG tablet Take 500 mg by mouth daily as needed (for muscle spasms).    Yes [provider]  NON FORMULARY Place 2 drops under the tongue daily. CBD Oil Doterra   Yes [provider]  OVER THE COUNTER MEDICATION Take 3 tablets by mouth 2 (two) times daily. Doterra Lifelong Vitality Pack   Yes [provider]  OVER THE COUNTER MEDICATION Take 1 tablet by mouth 2 (two) times daily. Bone Nutrient Supplement   Yes [provider]    Family History Family History  Problem Relation Age of Onset   Dementia Father    Parkinson's disease Mother    Stroke Brother    Colon cancer Neg Hx    Esophageal cancer Neg Hx    Pancreatic cancer Neg Hx    Rectal cancer Neg Hx    Stomach cancer Neg  Hx     Social History Social History   Tobacco Use   Smoking status: Never Smoker   Smokeless tobacco: Never Used  Substance Use Topics   Alcohol use: Yes    Alcohol/week: 0.0 standard drinks    Comment: occasional   Drug use: No     Allergies   Oxycodone-acetaminophen   Review of Systems Review of Systems  Unable to perform ROS: Mental status change     Physical Exam Updated Vital Signs BP (!) 135/105    Pulse 89    Temp 99 F (37.2 C) (Oral)    Resp (!) 23    Ht 5' (1.524 m)    Wt 44 kg    SpO2 99%    BMI 18.94 kg/m   Physical Exam Vitals signs and nursing note reviewed. Exam conducted with a chaperone present.  Constitutional:      Appearance: Normal appearance. She is well-developed.  HENT:     Head: Normocephalic. Battle's sign present.      Comments: Tenderness palpation in the right temporal region.  Battle sign noted on right skull that extends into the right paraspinal tissues. Eyes:     General: Lids are normal.     Conjunctiva/sclera: Conjunctivae normal.     Pupils: Pupils are equal, round, and reactive to light.     Comments: PERRL. EOMs intact. No nystagmus. No neglect.  Swelling, edema, tenderness noted to the right periorbital region both superior and inferior.  No deformity or crepitus noted.  Neck:     Musculoskeletal: Full passive range of motion without pain.  Cardiovascular:     Rate and Rhythm: Normal rate and regular rhythm.     Pulses: Normal pulses.          Radial pulses are 2+ on the right side and 2+ on the left side.     Heart sounds: Normal heart sounds. No murmur. No friction rub. No gallop.   Pulmonary:     Effort: Pulmonary effort is normal.     Breath sounds: Normal breath sounds.     Comments: Lungs clear to auscultation bilaterally.  Symmetric chest rise.  No wheezing, rales, rhonchi. Chest:       Comments: The exam was performed with a chaperone present.  Bruising noted to the midsternal region that extends over to  the left breast.  No deformity or crepitus noted. Abdominal:     Palpations: Abdomen is soft. Abdomen is not rigid.     Tenderness: There is no abdominal tenderness. There is no guarding.     Comments: Abdomen is soft, non-distended, non-tender. No rigidity, No guarding. No peritoneal signs.  Musculoskeletal: Normal range of motion.  Skin:    General: Skin is warm and dry.     Capillary Refill: Capillary refill takes less than 2 seconds.  Neurological:  Mental Status: She is alert.     Comments: Patient is alert. Slight gait ataxia. Follows most commands Moves extremities spontaneously Speech is altered.  No slurred speech but he does have difficulty with word finding and answer some questions inappropriately. She cannot tell me her name, where she is at, what year it is.   Psychiatric:        Speech: Speech normal.      ED Treatments / Results  Labs (all labs ordered are listed, but only abnormal results are displayed) Labs Reviewed  COMPREHENSIVE METABOLIC PANEL - Abnormal; Notable for the following components:      Result Value   Sodium 129 (*)    Potassium 3.2 (*)    Chloride 92 (*)    Glucose, Bld 122 (*)    Total Protein 8.2 (*)    AST 64 (*)    Total Bilirubin 6.9 (*)    All other components within normal limits  PROTIME-INR - Abnormal; Notable for the following components:   Prothrombin Time 18.5 (*)    INR 1.6 (*)    All other components within normal limits  CBC WITH DIFFERENTIAL/PLATELET - Abnormal; Notable for the following components:   RBC 2.89 (*)    Hemoglobin 10.5 (*)    HCT 29.7 (*)    MCV 102.8 (*)    MCH 36.3 (*)    Platelets 52 (*)    All other components within normal limits  SARS CORONAVIRUS 2 (HOSPITAL ORDER, PERFORMED IN [facility])  CBC WITH DIFFERENTIAL/PLATELET  AMMONIA  HIV ANTIBODY (ROUTINE TESTING W REFLEX)  HIV4GL SAVE TUBE  SODIUM, URINE, RANDOM  OSMOLALITY, URINE  OSMOLALITY  PREPARE FRESH FROZEN PLASMA  TYPE  AND SCREEN  PREPARE FRESH FROZEN PLASMA  ABO/RH    EKG None  Radiology Ct Head Wo Contrast  Addendum Date: 12/22/2018   ADDENDUM REPORT: 12/22/2018 12:43 ADDENDUM: Critical Value/emergent results were called by telephone at the time of interpretation on 12/22/2018 at 1234 hours to provider Dr. Derwood Kaplan , who verbally acknowledged these results. Electronically Signed   By: Odessa Fleming M.D.   On: 12/22/2018 12:43   Result Date: 12/22/2018 CLINICAL DATA:  58 year old female status post fall at home with hematoma about the right eye. Neck bruising. EXAM: CT HEAD WITHOUT CONTRAST TECHNIQUE: Contiguous axial images were obtained from the base of the skull through the vertex without intravenous contrast. COMPARISON:  None. FINDINGS: Brain: Mixed density but mostly hyperdense subdural hematoma tracking throughout the left hemisphere ranging from 7-10 millimeters in thickness. Relatively mild mass effect on the left hemisphere with rightward midline shift of 3 millimeters. Small volume of blood layering on the left tentorium. Trace blood also along the falx. Superimposed small hemorrhagic contusions in the anterior anterior left frontal lobe (sagittal image 32) and the superior left temporal gyrus (image 13), the latter is larger measuring 13 millimeters in diameter with mild regional edema. No intraventricular hemorrhage or ventriculomegaly. Basilar cisterns remain patent. No right side or subarachnoid hemorrhage. No superimposed No cortically based acute infarct identified. Vascular: No suspicious intracranial vascular hyperdensity. Skull: No fracture identified. Sinuses/Orbits: Visualized paranasal sinuses and mastoids are clear. Other: Broad-based posterior scalp hematoma, eccentric to the right. No underlying calvarium fracture identified. Partially visible right inferior and lateral periorbital scalp and face hematoma/contusion. The right globe and other intraorbital soft tissues remain normal.  IMPRESSION: 1. Positive for posttraumatic intracranial hemorrhage: - left hemisphere Subdural Hematoma up to 10 mm thickness. - at least 3  small hemorrhagic contusions in the anterior left frontal and superior left temporal lobes. - no subarachnoid or intraventricular hemorrhage identified. 2. Rightward midline shift of 3 millimeters. Basilar cisterns remain patent. 3. Large scalp hematoma but no skull fracture identified. Electronically Signed: By: Odessa Fleming M.D. On: 12/22/2018 12:31   Ct Cervical Spine Wo Contrast  Result Date: 12/22/2018 CLINICAL DATA:  58 year old female status post fall at home with hematoma about the right eye. Neck bruising. EXAM: CT CERVICAL SPINE WITHOUT CONTRAST TECHNIQUE: Multidetector CT imaging of the cervical spine was performed without intravenous contrast. Multiplanar CT image reconstructions were also generated. COMPARISON:  Head CT today reported separately. FINDINGS: Alignment: Straightening of cervical lordosis. Cervicothoracic junction alignment is within normal limits. Bilateral posterior element alignment is within normal limits. Skull base and vertebrae: Visualized skull base is intact. No atlanto-occipital dissociation. No acute osseous abnormality identified. No acute osseous abnormality identified. Soft tissues and spinal canal: No prevertebral fluid or swelling. No visible canal hematoma. Left side subdural hematoma redemonstrated in the visible lower brain. Confluent soft tissue stranding along the superficial right lateral neck on series 12, image 118 is compatible with posttraumatic hematoma or contusion. Negative left-side noncontrast neck soft tissues. Disc levels: Age-appropriate cervical spine degeneration. No spinal stenosis suspected. Upper chest: Visible upper thoracic levels appear intact. Negative lung apices and noncontrast thoracic inlet. IMPRESSION: 1. Superficial right neck soft tissue contusion/hematoma. 2. No acute traumatic injury identified in the  cervical spine. 3. See also Head CT, reported separately. Electronically Signed   By: Odessa Fleming M.D.   On: 12/22/2018 12:38   Dg Pelvis Portable  Result Date: 12/22/2018 CLINICAL DATA:  Fall. EXAM: PORTABLE PELVIS 1-2 VIEWS COMPARISON:  None. FINDINGS: There is no evidence of pelvic fracture or diastasis. No pelvic bone lesions are seen. IMPRESSION: Negative. Electronically Signed   By: Lupita Raider M.D.   On: 12/22/2018 13:15   Dg Chest Portable 1 View  Result Date: 12/22/2018 CLINICAL DATA:  Larey Seat today.  Altered mental status. EXAM: PORTABLE CHEST 1 VIEW COMPARISON:  None. FINDINGS: The cardiac silhouette, mediastinal and hilar contours are within normal limits. The lungs are clear. Mild eventration of the right hemidiaphragm. No pleural effusions. No pneumothorax. The bony thorax is intact. No definite rib fractures. Remote right shoulder hardware. IMPRESSION: No acute cardiopulmonary findings and intact bony thorax. Electronically Signed   By: Rudie Meyer M.D.   On: 12/22/2018 13:15    Procedures .Critical Care Performed by: Maxwell Caul, PA-C Authorized by: Maxwell Caul, PA-C   Critical care provider statement:    Critical care time (minutes):  45   Critical care was necessary to treat or prevent imminent or life-threatening deterioration of the following conditions:  Trauma   Critical care was time spent personally by me on the following activities:  Discussions with consultants, evaluation of patient's response to treatment, examination of patient, ordering and performing treatments and interventions, ordering and review of laboratory studies, ordering and review of radiographic studies, pulse oximetry, re-evaluation of patient's condition, obtaining history from patient or surrogate and review of old charts   (including critical care time)  Medications Ordered in ED Medications  0.9 %  sodium chloride infusion (has no administration in time range)  pantoprazole  (PROTONIX) injection 40 mg (has no administration in time range)  levETIRAcetam (KEPPRA) IVPB 1000 mg/100 mL premix (0 mg Intravenous Stopped 12/22/18 1430)  LORazepam (ATIVAN) 2 MG/ML injection (1 mg  Given 12/22/18 1350)  0.9 %  sodium chloride infusion (10 mL/hr Intravenous New Bag/Given 12/22/18 1506)     Initial Impression / Assessment and Plan / ED Course  I have reviewed the triage vital signs and the nursing notes.  Pertinent labs & imaging results that were available during my care of the patient were reviewed by me and considered in my medical decision making (see chart for details).        58 y.o. F past with history of cirrhosis, autoimmune hepatitis, esophageal varices brought in by caregiver for evaluation of unwitnessed fall that we think occurred approximately 3 days ago.  Last seen normal on 12/19/2018.  Patient did tell caregiver at one point that she fell down some stairs.  Over the last 24 hours, caregiver has noticed increased confusion, balance issues and worsening bruising, prompting ED visit.  Initially arrival, she is afebrile, blood pressure is 137/93.  Vital signs are stable.  She is moving all extremities spontaneously and follows most commands but is having some word finding difficulty and is answering questions inappropriately.  She cannot tell me her name, where she is at or what year it is.  No weakness noted on upper and lower extremities.  She has some slight gait ataxia.  Concern for intracranial hemorrhage.  CT head, CT C-spine ordered at triage.  CMP shows sodium 129, calcium 3.2.  BUN and creatinine within normal limits.  AST is 64, ALT is 28, total bili 6.9.  CBC shows no leukocytosis.  Hemoglobin is 10.5.  Platelets are 52.  CT scan shows subdural hematoma noted to be 10 mm in thickness with at least 3 small hemorrhagic contusions in the left anterior frontal and superior temporal lobes.  There is a rightward midline shift of about 3 mm.  Scalp hematoma noted.   No skull fracture noted.  Neurosurgery consult placed (12:40 pm).   CT C-spine shows superficial right neck soft tissue contusion/hematoma.  Discussed with caregiver who is at bedside.  She is at home with patient and cares for patient's brother who is post CVA.  Patient herself does not need a caregiver and normally has capacity make her full decisions.  Caregiver denies any family or any other contact that could make medical decisions.  She does not have a PCP.  She has been followed by [facility] regarding her cirrhosis.  I was called into the room by nursing staff who stated that patient was having twitching.  On my evaluation, patient had facial twitching as well as some foaming of the mouth.  It appeared to be a partial seizure.  Patient given Ativan with improvement of seizing but patient became aphasic and had difficulty following commands.  Notify Dr. Vertell Limber who recommended 1 g of Keppra.  Additionally, Dr. Vertell Limber discussed with [facility] who recommended 2 units of FFP as well as obtaining an ammonia level.  Discussed patient with Trauma Team. They recommend medical admission given patient's complex hepatic history.   Discussed patient with Family Medicine Team who agree for admission. They are requesting Neurology consult.   Discussed with family med who is asking critical care admit.  Discussed with critical care.  They will plan to admit patient.  Portions of this note were generated with Lobby[employer]. Dictation errors may occur despite best attempts at proofreading.  Final Clinical Impressions(s) / ED Diagnoses   Final diagnoses:  Subdural hematoma ([place])  Fall, initial encounter    ED Discharge Orders    None       Jianni Batten,  Early Chars, PA-C 12/22/18 1551    Bethann Berkshire, MD 12/23/18 [phone]

## 2018-12-22 NOTE — Transfer of Care (Signed)
Immediate Anesthesia Transfer of Care Note  Patient: Michelle Schneider  Procedure(s) Performed: CRANIOTOMY HEMATOMA EVACUATION SUBDURAL (Left Head)  Patient Location: ICU  Anesthesia Type:General  Level of Consciousness: Patient remains intubated per anesthesia plan  Airway & Oxygen Therapy: Patient remains intubated per anesthesia plan and Patient placed on Ventilator (see vital sign flow sheet for setting)  Post-op Assessment: Report given to RN and Post -op Vital signs reviewed and stable  Post vital signs: Reviewed and stable  Last Vitals:  Vitals Value Taken Time  BP 148/70 (ABP) 12/22/18 2101  Temp    Pulse 71 12/22/18 2104  Resp 17 12/22/18 2104  SpO2 100 % 12/22/18 2104  Vitals shown include unvalidated device data.  Last Pain:  Vitals:   12/22/18 1651  TempSrc: Oral  PainSc:        Patient currently on propofol gtt, neo gtt weaned off, RT at bedside, ETT remained secured at 20 cm at incisors, PRVC 40% FiO2, 15 RR, 5 PEEP, 410 ml Vt, bilateral breath sounds, ventilation confirmed, currently 1st unit of PRBCs, Joslin MD at bedside - communicated to [facility] to administer additional 2nd unit of PRBCs and Plts (present in room). Patient vital signs are stable, questions answered.  Complications: No apparent anesthesia complications

## 2018-12-22 NOTE — Progress Notes (Signed)
Patient to 4N22 at 2052, not following commands on arrival but with reduction in sedation gtt will arouse to voice and follow commands. No belongings at bedside. Updated patient's brother, Francelia Mclaren, via his home caretaker Lattie Haw.  Candy Sledge, RN

## 2018-12-22 NOTE — Consult Note (Addendum)
[facility] Consult: 2:52 PM 12/22/2018  LOS: 0 days    Referring Provider: Dr Vertell Limber, neurosurgery  Primary Care Physician:  Patient, No Pcp Per Primary Gastroenterologist:  Dr. 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  Raynauds.     HPI: Michelle Schneider is a 58 y.o. female. Diagnosed with decompensated cirrhosis in 08/2015 admission.  Cause determined as AIH.  Liver biopsy c/w AIH.     10/2015 EGD: For variceal screening.  Small hiatal hernia.  2 columns of small varices in the distal esophagus food residue in the stomach, pylorus and duodenum were not evaluated due to the presence of the food.  No gastric varices  intolerannt of beta blockers.    06/2017 EGD for variceal surveillance. Small hiatal hernia.  2 columns of trace varices in the distal esophagus.  Stomach and examined duodenum normal  05/2017 Cologuard testing negative. 02/2017 abdominal ultrasound:   Cirrhosis of the liver, no ascites.  Uncomplicated gallstones.  Has not been seen by Dr. Havery Moros since the EGD in April 2019.  Patient lives with her brother who has nursing care at home following a stroke several years ago.  According to the brother's nurses aide, the patient is normally sharp as attack.  Patient was alone at home in the evening on Monday and fell down the stairs.  When the aide got there Tuesday morning patient seemed okay though she was a bit tired and slept.  She ate okay.  On Tuesday, the aide noticed that she was beginning to get some bruising in the region of her right eye.  On Wednesday evening seemed like her speech was a little bit slurred and this morning, Thursday, she was confused, aphasic, unsteady on her feet and the aide called the ambulance.  Neurosurgery has seen the patient  following CT of the head confirming subdural hematoma.  Plan is to take her to the OR for bur hole procedure.  After Vertell Limber is concerned because her INR is elevated at 1.6, PT is 18.5.  He will be giving her FFP prior to the procedure Hgb is 10.5.  MCV 102.  Platelets are 50 2K. Previous history of platelets 111, Hgb 14.7 in 12/2017 Patient's bilirubin is 6.9, previously 1.6 in October 2019.  Alk phos normal.  AST/ALT 64/28. Sodium low at 129, potassium low at 3.2.  Patient is aphasic and unable to answer my questions though she is alert.    Past Medical History:  Diagnosis Date   Autoimmune hepatitis ([place])    Cirrhosis ([place])    Esophageal varices ([place])    Family history of adverse reaction to anesthesia    cousin had a difficult time waking up after appendectomy   Heart murmur    "diagnosed with a very slight heart murmur in 2001 but no other doctor has been able to hear it"   Hyperlipidemia    Raynaud's disease     Past Surgical History:  Procedure Laterality Date   ABDOMINAL HYSTERECTOMY     ESOPHAGOGASTRODUODENOSCOPY (  EGD) WITH PROPOFOL N/A 06/15/2017   Procedure: ESOPHAGOGASTRODUODENOSCOPY (EGD) WITH PROPOFOL;  Surgeon: Yetta Flock, MD;  Location: [facility];  Service: Gastroenterology;  Laterality: N/A;   ORIF HUMERUS FRACTURE Right 05/23/2015   Procedure: OPEN REDUCTION INTERNAL FIXATION (ORIF) RIGHT  PROXIMAL HUMERUS FRACTURE;  Surgeon: Justice Britain, MD;  Location: [facility];  Service: Orthopedics;  Laterality: Right;   WISDOM TOOTH EXTRACTION      Prior to Admission medications   Medication Sig Start Date End Date Taking? Authorizing Provider  magnesium gluconate (MAGONATE) 500 MG tablet Take 500 mg by mouth daily as needed (for muscle spasms).    Yes [provider]  NON FORMULARY Place 2 drops under the tongue daily. CBD Oil Doterra   Yes [provider]  OVER THE COUNTER MEDICATION Take 3 tablets by mouth 2 (two) times daily. Doterra  Lifelong Vitality Pack   Yes [provider]  OVER THE COUNTER MEDICATION Take 1 tablet by mouth 2 (two) times daily. Bone Nutrient Supplement   Yes [provider]    Scheduled Meds:  Infusions:  PRN Meds:    Allergies as of 12/22/2018 - Review Complete 12/22/2018  Allergen Reaction Noted   Oxycodone-acetaminophen Nausea And Vomiting and Other (See Comments) 05/17/2015    Family History  Problem Relation Age of Onset   Dementia Father    Parkinson's disease Mother    Stroke Brother    Colon cancer Neg Hx    Esophageal cancer Neg Hx    Pancreatic cancer Neg Hx    Rectal cancer Neg Hx    Stomach cancer Neg Hx     Social History   Socioeconomic History   Marital status: Divorced    Spouse name: Not on file   Number of children: Not on file   Years of education: Not on file   Highest education level: Not on file  Occupational History   Occupation: N/A  Social Designer, fashion/clothing strain: Not on file   Food insecurity    Worry: Not on file    Inability: Not on file   Transportation needs    Medical: Not on file    Non-medical: Not on file  Tobacco Use   Smoking status: Never Smoker   Smokeless tobacco: Never Used  Substance and Sexual Activity   Alcohol use: Yes    Alcohol/week: 0.0 standard drinks    Comment: occasional   Drug use: No   Sexual activity: Not Currently  Lifestyle   Physical activity    Days per week: Not on file    Minutes per session: Not on file   Stress: Not on file  Relationships   Social connections    Talks on phone: Not on file    Gets together: Not on file    Attends religious service: Not on file    Active member of club or organization: Not on file    Attends meetings of clubs or organizations: Not on file    Relationship status: Not on file   Intimate partner violence    Fear of current or ex partner: Not on file    Emotionally abused: Not on file    Physically abused: Not  on file    Forced sexual activity: Not on file  Other Topics Concern   Not on file  Social History Narrative   Not on file    REVIEW OF SYSTEMS: Constitutional: Normally patient is active, she has a dog that she walks. ENT:  No nose bleeds Pulm: No cough, no shortness of breath. CV:  No palpitations, no LE edema.  GU:   GI: She chronically "eats like a bird "according to the nurses aide but her weight has been stable.  There is been no issues with abdominal pain, abdominal swelling or nausea, vomiting. Heme: Bruises fairly easily and she is got bruises on her arms and sometimes on her legs from the dog jumping on her. Transfusions: No known history of blood transfusions. Neuro: See HPI.  Patient denies headache. Derm: no itching, no rash or sores.  Endocrine:  No sweats or chills.  No polyuria or dysuria Immunization: Not known.  Suspect she has not had a flu shot since she does not have a family doctor and has not seen any doctors in over a year.  Exudative for hepatitis A and B in 2017 through 2018 Travel:  None beyond local counties in last few months.    PHYSICAL EXAM: Vital signs in last 24 hours: Vitals:   12/22/18 1400 12/22/18 1415  BP: (!) 130/105 (!) 126/93  Pulse: 96 96  Resp: (!) 23 15  Temp:    SpO2: 99% 100%   Wt Readings from Last 3 Encounters:  12/22/18 44 kg  06/15/17 44 kg  04/26/17 44.2 kg    General: Petite WF who is sitting on the stretcher comfortable.  She has a large extensive bruise on the right eye extending around the side of the face into the right neck Head: See above.   no facial asymmetry. Eyes: No scleral icterus.  No conjunctival pallor.  Not following commands so I cannot assess her extraocular movement. Ears: Seems to hear okay but her answers are random words Nose: No discharge Mouth: Would not open her mouth despite multiple requests.  No blood or dried blood at the oral opening Neck: No JVD, no masses.  Bruising on the right neck  laterally and moving around to the back of the neck Lungs: Clear bilaterally though poor inspiratory effort.  No labored breathing or cough. Heart: RRR.  No MRG.  S1, S2 present Abdomen: Not tender, not distended.  No bruising.  No HSM or masses.  No hernias.  Active bowel sounds..   Rectal: Did not perform Musc/Skeltl: No obvious joint deformities. Extremities: Dusky coloration to the skin of the lower legs, this is chronic according to the nurses aide.  There is no lower extremity or upper extremity edema.  No anasarca. Neurologic: Aphasic, speaks random words in response to questions.  Nods affirmatively as you speak to her.  Not following commands but able to move all 4 limbs.  She was able to sit up in the bed so I could listen to her lungs Nodes: No cervical adenopathy Psych: Calm, cooperative.  Intake/Output from previous day: No intake/output data recorded. Intake/Output this shift: Total I/O In: 100 [IV Piggyback:100] Out: -   LAB RESULTS: Recent Labs    12/22/18 1245 12/22/18 1355  WBC SPECIMEN CLOTTED 4.7  HGB SPECIMEN CLOTTED 10.5*  HCT SPECIMEN CLOTTED 29.7*  PLT SPECIMEN CLOTTED 52*   BMET Lab Results  Component Value Date   NA 129 (L) 12/22/2018   NA 139 04/22/2017   NA 141 08/14/2016   K 3.2 (L) 12/22/2018   K 3.5 04/22/2017   K 4.3 08/14/2016   CL 92 (L) 12/22/2018   CL 103 04/22/2017   CL 107 08/14/2016   CO2 24 12/22/2018   CO2 29 04/22/2017   CO2 29 08/14/2016  GLUCOSE 122 (H) 12/22/2018   GLUCOSE 99 04/22/2017   GLUCOSE 96 08/14/2016   BUN 10 12/22/2018   BUN 12 04/22/2017   BUN 14 08/14/2016   CREATININE 0.54 12/22/2018   CREATININE 0.59 04/22/2017   CREATININE 0.50 08/14/2016   CALCIUM 9.6 12/22/2018   CALCIUM 10.1 04/22/2017   CALCIUM 9.8 08/14/2016   LFT Recent Labs    12/22/18 1245  PROT 8.2*  ALBUMIN 4.1  AST 64*  ALT 28  ALKPHOS 69  BILITOT 6.9*   PT/INR Lab Results  Component Value Date   INR 1.6 (H) 12/22/2018   INR  1.3 (H) 12/31/2017   INR 1.3 (H) 04/22/2017   Hepatitis Panel No results for input(s): HEPBSAG, HCVAB, HEPAIGM, HEPBIGM in the last 72 hours. C-Diff No components found for: CDIFF Lipase     Component Value Date/Time   LIPASE 73 (H) 08/23/2015 1641    Drugs of Abuse  No results found for: LABOPIA, COCAINSCRNUR, LABBENZ, AMPHETMU, THCU, LABBARB   RADIOLOGY STUDIES: Ct Head Wo Contrast  Addendum Date: 12/22/2018   ADDENDUM REPORT: 12/22/2018 12:43 ADDENDUM: Critical Value/emergent results were called by telephone at the time of interpretation on 12/22/2018 at 1234 hours to provider Dr. Varney Biles , who verbally acknowledged these results. Electronically Signed   By: Genevie Ann M.D.   On: 12/22/2018 12:43   Result Date: 12/22/2018 CLINICAL DATA:  58 year old female status post fall at home with hematoma about the right eye. Neck bruising. EXAM: CT HEAD WITHOUT CONTRAST TECHNIQUE: Contiguous axial images were obtained from the base of the skull through the vertex without intravenous contrast. COMPARISON:  None. FINDINGS: Brain: Mixed density but mostly hyperdense subdural hematoma tracking throughout the left hemisphere ranging from 7-10 millimeters in thickness. Relatively mild mass effect on the left hemisphere with rightward midline shift of 3 millimeters. Small volume of blood layering on the left tentorium. Trace blood also along the falx. Superimposed small hemorrhagic contusions in the anterior anterior left frontal lobe (sagittal image 32) and the superior left temporal gyrus (image 13), the latter is larger measuring 13 millimeters in diameter with mild regional edema. No intraventricular hemorrhage or ventriculomegaly. Basilar cisterns remain patent. No right side or subarachnoid hemorrhage. No superimposed No cortically based acute infarct identified. Vascular: No suspicious intracranial vascular hyperdensity. Skull: No fracture identified. Sinuses/Orbits: Visualized paranasal sinuses and  mastoids are clear. Other: Broad-based posterior scalp hematoma, eccentric to the right. No underlying calvarium fracture identified. Partially visible right inferior and lateral periorbital scalp and face hematoma/contusion. The right globe and other intraorbital soft tissues remain normal. IMPRESSION: 1. Positive for posttraumatic intracranial hemorrhage: - left hemisphere Subdural Hematoma up to 10 mm thickness. - at least 3 small hemorrhagic contusions in the anterior left frontal and superior left temporal lobes. - no subarachnoid or intraventricular hemorrhage identified. 2. Rightward midline shift of 3 millimeters. Basilar cisterns remain patent. 3. Large scalp hematoma but no skull fracture identified. Electronically Signed: By: Genevie Ann M.D. On: 12/22/2018 12:31   Ct Cervical Spine Wo Contrast  Result Date: 12/22/2018 CLINICAL DATA:  58 year old female status post fall at home with hematoma about the right eye. Neck bruising. EXAM: CT CERVICAL SPINE WITHOUT CONTRAST TECHNIQUE: Multidetector CT imaging of the cervical spine was performed without intravenous contrast. Multiplanar CT image reconstructions were also generated. COMPARISON:  Head CT today reported separately. FINDINGS: Alignment: Straightening of cervical lordosis. Cervicothoracic junction alignment is within normal limits. Bilateral posterior element alignment is within normal limits. Skull base and vertebrae:  Visualized skull base is intact. No atlanto-occipital dissociation. No acute osseous abnormality identified. No acute osseous abnormality identified. Soft tissues and spinal canal: No prevertebral fluid or swelling. No visible canal hematoma. Left side subdural hematoma redemonstrated in the visible lower brain. Confluent soft tissue stranding along the superficial right lateral neck on series 12, image 118 is compatible with posttraumatic hematoma or contusion. Negative left-side noncontrast neck soft tissues. Disc levels:  Age-appropriate cervical spine degeneration. No spinal stenosis suspected. Upper chest: Visible upper thoracic levels appear intact. Negative lung apices and noncontrast thoracic inlet. IMPRESSION: 1. Superficial right neck soft tissue contusion/hematoma. 2. No acute traumatic injury identified in the cervical spine. 3. See also Head CT, reported separately. Electronically Signed   By: Genevie Ann M.D.   On: 12/22/2018 12:38   Dg Pelvis Portable  Result Date: 12/22/2018 CLINICAL DATA:  Fall. EXAM: PORTABLE PELVIS 1-2 VIEWS COMPARISON:  None. FINDINGS: There is no evidence of pelvic fracture or diastasis. No pelvic bone lesions are seen. IMPRESSION: Negative. Electronically Signed   By: Marijo Conception M.D.   On: 12/22/2018 13:15   Dg Chest Portable 1 View  Result Date: 12/22/2018 CLINICAL DATA:  [place] today.  Altered mental status. EXAM: PORTABLE CHEST 1 VIEW COMPARISON:  None. FINDINGS: The cardiac silhouette, mediastinal and hilar contours are within normal limits. The lungs are clear. Mild eventration of the right hemidiaphragm. No pleural effusions. No pneumothorax. The bony thorax is intact. No definite rib fractures. Remote right shoulder hardware. IMPRESSION: No acute cardiopulmonary findings and intact bony thorax. Electronically Signed   By: Marijo Sanes M.D.   On: 12/22/2018 13:15      IMPRESSION:   *   Cirrhosis, autoimmune hepatitis. T bili elevated, not clear if this is acute or has been steadily increasing.  Since last comparison labs are from 1 year ago.  AST slightly elevated.  *     Subdural hematoma following fall down the stairs 4 days ago.  Resulting aphasia.  Dr. Vertell Limber plans to take her for bur hole procedure.  *    Coagulopathy.  *      Thrombocytopenia.  *    Hyponatremia.  Renal function normal.   PLAN:     *   Ammonia level has been ordered.  If this is elevated would start lactulose.  Trudee Kuster hole procedure   Azucena Freed  12/22/2018, 2:52 PM Phone [phone]  [phone]   Attending physician's note   I have taken an interval history, reviewed the chart and examined the patient. I agree with the Advanced Practitioner's note, impression and recommendations.   Patient seen in the ED. Going to OR right away.  Discussed briefly with patient's daughter.  I did not have a chance to review previous GI notes.  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  She is currently on 2nd Unit.  She will get 2 more units in OR. -Recommend platelet transfusion as well. -Vitamin K after surgery. -Trend CBC, PT/INR. -Daughter is aware that her mom is high risk for any type of surgery and has significant risk for decompensation. -We will follow along in the postoperative period.  Carmell Austria, MD [facility] [phone].

## 2018-12-22 NOTE — Anesthesia Postprocedure Evaluation (Signed)
Anesthesia Post Note  Patient: Michelle Schneider  Procedure(s) Performed: CRANIOTOMY HEMATOMA EVACUATION SUBDURAL (Left Head)     Patient location during evaluation: NICU Anesthesia Type: General Level of consciousness: sedated and patient remains intubated per anesthesia plan Pain management: pain level controlled Vital Signs Assessment: post-procedure vital signs reviewed and stable Respiratory status: patient remains intubated per anesthesia plan and patient on ventilator - see flowsheet for VS Cardiovascular status: stable Postop Assessment: no apparent nausea or vomiting Anesthetic complications: no    Last Vitals:  Vitals:   12/22/18 2148 12/22/18 2213  BP: (!) 142/71 (!) 153/75  Pulse: (!) 56 (!) 58  Resp: 15 15  Temp: 37.2 C 36.9 C  SpO2: 100%     Last Pain:  Vitals:   12/22/18 2213  TempSrc: Esophageal  PainSc:                  JOSLIN,DAVID COKER

## 2018-12-22 NOTE — Anesthesia Procedure Notes (Signed)
Procedure Name: Intubation Date/Time: 12/22/2018 7:06 PM Performed by: Jearld Pies, CRNA Pre-anesthesia Checklist: Patient identified, Emergency Drugs available, Suction available and Patient being monitored Patient Re-evaluated:Patient Re-evaluated prior to induction Oxygen Delivery Method: Circle System Utilized Preoxygenation: Pre-oxygenation with 100% oxygen Induction Type: IV induction and Rapid sequence Laryngoscope Size: Mac and 3 Grade View: Grade I Tube type: Oral Tube size: 7.0 mm Number of attempts: 1 Airway Equipment and Method: Stylet Placement Confirmation: ETT inserted through vocal cords under direct vision,  positive ETCO2 and breath sounds checked- equal and bilateral Secured at: 20 cm Tube secured with: Tape Dental Injury: Teeth and Oropharynx as per pre-operative assessment

## 2018-12-22 NOTE — ED Notes (Signed)
While at patients bedside, patient began to have seizure activity-shaking and unable to speak/respond. Patients right side became contracted as well. Immediately notified dr. Roderic Palau and PA, Mendel Ryder. PA lindsey to bedside, have verbal order for 1mg  IV ativan. Patient still unable to give verbal responses.

## 2018-12-22 NOTE — Brief Op Note (Signed)
12/22/2018  8:40 PM  PATIENT:  Michelle Schneider  58 y.o. 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  POST-OPERATIVE DIAGNOSIS:   subdural hematoma left with coagulopathy and cirrhosis PROCEDURE:  Procedure(s): CRANIOTOMY HEMATOMA EVACUATION SUBDURAL (Left)  SURGEON:  Surgeon(s) and Role:    Erline Levine, MD - Primary  PHYSICIAN ASSISTANT:   ASSISTANTS: [place], RN   ANESTHESIA:   general  EBL:  300 mL   BLOOD ADMINISTERED:1 unit CC PRBC, 2 units FFP and 10 pack PLTS  DRAINS: (#10) Jackson-Pratt drain(s) with closed bulb suction in the subgaleal space   LOCAL MEDICATIONS USED:  MARCAINE    and LIDOCAINE   SPECIMEN:  No Specimen  DISPOSITION OF SPECIMEN:  N/A  COUNTS:  YES  TOURNIQUET:  * No tourniquets in log *  DICTATION: Patient is 58 year old woman who fell and struck her head. She has cirrhosis and coagulopathy with low platelets.  The patient presents with large subdural and aphasia.  Head CT shows panhemispheric left SDH with mass effect and early shift.It was elected to take patient to surgery for craniotomy for SDH.  Procedure:  Following smooth intubation, patient was placed in right semi-lateral position with blanket roll.  Head was placed on donut head holder and left frontal scalp was shaved and prepped and draped in usual sterile fashion.  Area of planned incision was infiltrated with lidocaine. A curvilinear incision was made and carried through temporalis fascia and muscle to expose calvarium.  Skull flap was elevated exposing subdural hematoma.  Dura was opened and subdural was evacuated.  This was acute blood and under pressure.   This was evacuated and a considerable amount of blood was removed. The brain was irrigated.    Hemostasis was assured. Surgifoam was used to help achieve hemostasis.  The brain came back up after hematoma evacuation.  The dura was patched with a dural graft,  bone flap was replaced with  plates, a subgaleal #10 JP drain was placed.  The fascia and galea were closed with 2-0 vicryl sutures and the skin was re approximated with staples.  A sterile occlusive dressing was placed.  Patient was extubated and taken to ICU in stable condition having tolerated her operation well.   PLAN OF CARE: Admit to inpatient   PATIENT DISPOSITION:  PACU - hemodynamically stable.   Delay start of Pharmacological VTE agent (>24hrs) due to surgical blood loss or risk of bleeding: yes

## 2018-12-22 NOTE — Anesthesia Procedure Notes (Signed)
Arterial Line Insertion Start/End10/10/2018 7:11 PM, 12/22/2018 7:12 PM Performed by: Roberts Gaudy, MD, anesthesiologist  Patient location: OR. Emergency situation Right, radial was placed Catheter size: 20 G Hand hygiene performed , maximum sterile barriers used  and Seldinger technique used Allen's test indicative of satisfactory collateral circulation Attempts: 1 Procedure performed without using ultrasound guided technique. Following insertion, dressing applied and Biopatch. Post procedure assessment: normal  Patient tolerated the procedure well with no immediate complications.

## 2018-12-22 NOTE — Progress Notes (Signed)
PCCM Interval Note   S/p to ICU on mechanical ventilation after crani.  EBL 300 ml.  S/p 2 u FFP and 10 pk platelets preop.  Patient is hemodynamically stable, currently on propofol 40 mcg/kg/min, s/p morphine.  RN is slowing titrating sedation down for wakeup assessment.  Currently on first of two units PRBC and still to be transfused 1 u FFP  Blood pressure (!) 153/75, pulse (!) 58, temperature 98.4 F (36.9 C), temperature source Esophageal, resp. rate 15, height (S) 5\' 3"  (1.6 m), weight 44 kg, SpO2 100 %.  Vent Mode: PRVC FiO2 (%):  [40 %] 40 % Set Rate:  [15 bmp] 15 bmp Vt Set:  [410 mL] 410 mL PEEP:  [5 cmH20] 5 cmH20 Plateau Pressure:  [14 cmH20] 14 cmH20  General:  Thin adult female sedated on MV HEENT: pupils 3/sluggish, crani dressing intact, right periorbital and right neck ecchymosis, ETT, No OGT, esophegeal temp probe in place Neuro: sedated  CV: rr, no murmur PULM:  MV supported breaths, CTA GI: soft, ND Extremities: warm/dry, no LE edema  Skin: scattered bruises   Acute Left SDH s/p fall s/p crani Cirrhosis with INR 1.6 Thrombocytopenia  Hyponatremia Hypokalemia Acute respiratory insufficiency related to above  P:  SBP goal <160 F/u CTH in am  Ongoing neuro exams Full MV support, PRVC 8 cc/kg ABG now CXR reviewed, ETT stable NO OGT given hx esophogeal varices  VAP/ PPI PAD protocol with propofol and fentanyl for RASS goal 0-/1 BMP/ mag now Pending urine studies and serum osmolarity F/u H/H and coags post transfusion    Add CCT 30  Kennieth Rad, MSN, AGACNP-BC [facility] Pgr: [phone] or if no answer [phone] 12/22/2018, 10:32 PM

## 2018-12-23 ENCOUNTER — Other Ambulatory Visit: Payer: Self-pay

## 2018-12-23 ENCOUNTER — Inpatient Hospital Stay (HOSPITAL_COMMUNITY): Payer: No Typology Code available for payment source

## 2018-12-23 ENCOUNTER — Encounter (HOSPITAL_COMMUNITY): Payer: Self-pay | Admitting: Neurosurgery

## 2018-12-23 DIAGNOSIS — J9601 Acute respiratory failure with hypoxia: Secondary | ICD-10-CM

## 2018-12-23 DIAGNOSIS — Z9911 Dependence on respirator [ventilator] status: Secondary | ICD-10-CM

## 2018-12-23 LAB — BPAM FFP
Blood Product Expiration Date: 202010082359
Blood Product Expiration Date: 202010082359
Blood Product Expiration Date: 202010082359
Blood Product Expiration Date: 202010082359
ISSUE DATE / TIME: 202010081404
ISSUE DATE / TIME: 202010081404
ISSUE DATE / TIME: 202010081748
ISSUE DATE / TIME: 202010081748
Unit Type and Rh: 600
Unit Type and Rh: 6200
Unit Type and Rh: 6200
Unit Type and Rh: 6200

## 2018-12-23 LAB — BASIC METABOLIC PANEL
Anion gap: 13 (ref 5–15)
Anion gap: 14 (ref 5–15)
BUN: <5 mg/dL — ABNORMAL LOW (ref 6–20)
BUN: <5 mg/dL — ABNORMAL LOW (ref 6–20)
CO2: 21 mmol/L — ABNORMAL LOW (ref 22–32)
CO2: 21 mmol/L — ABNORMAL LOW (ref 22–32)
Calcium: 8.1 mg/dL — ABNORMAL LOW (ref 8.9–10.3)
Calcium: 8.3 mg/dL — ABNORMAL LOW (ref 8.9–10.3)
Chloride: 100 mmol/L (ref 98–111)
Chloride: 102 mmol/L (ref 98–111)
Creatinine, Ser: 0.38 mg/dL — ABNORMAL LOW (ref 0.44–1.00)
Creatinine, Ser: 0.4 mg/dL — ABNORMAL LOW (ref 0.44–1.00)
GFR calc Af Amer: >60 mL/min (ref >60)
GFR calc Af Amer: >60 mL/min (ref >60)
GFR calc non Af Amer: >60 mL/min (ref >60)
GFR calc non Af Amer: >60 mL/min (ref >60)
Glucose, Bld: 122 mg/dL — ABNORMAL HIGH (ref 70–99)
Glucose, Bld: 129 mg/dL — ABNORMAL HIGH (ref 70–99)
Potassium: 2.4 mmol/L — CL (ref 3.5–5.1)
Potassium: 3.1 mmol/L — ABNORMAL LOW (ref 3.5–5.1)
Sodium: 134 mmol/L — ABNORMAL LOW (ref 135–145)
Sodium: 137 mmol/L (ref 135–145)

## 2018-12-23 LAB — PREPARE PLATELET PHERESIS
Unit division: 0
Unit division: 0

## 2018-12-23 LAB — PREPARE FRESH FROZEN PLASMA
Unit division: 0
Unit division: 0
Unit division: 0
Unit division: 0

## 2018-12-23 LAB — BPAM RBC
Blood Product Expiration Date: 202010212359
Blood Product Expiration Date: 202010212359
ISSUE DATE / TIME: 202010081957
ISSUE DATE / TIME: 202010081957
Unit Type and Rh: 600
Unit Type and Rh: 600

## 2018-12-23 LAB — BPAM PLATELET PHERESIS
Blood Product Expiration Date: 202010112359
Blood Product Expiration Date: 202010112359
ISSUE DATE / TIME: 202010081921
ISSUE DATE / TIME: 202010081953
Unit Type and Rh: 7300
Unit Type and Rh: 7300

## 2018-12-23 LAB — TYPE AND SCREEN
ABO/RH(D): A NEG
Antibody Screen: NEGATIVE
Unit division: 0
Unit division: 0

## 2018-12-23 LAB — BPAM CRYOPRECIPITATE
Blood Product Expiration Date: 202010090149
ISSUE DATE / TIME: 202010082009
Unit Type and Rh: 6200

## 2018-12-23 LAB — PREPARE CRYOPRECIPITATE: Unit division: 0

## 2018-12-23 LAB — CBC
HCT: 26.7 % — ABNORMAL LOW (ref 36.0–46.0)
Hemoglobin: 9.7 g/dL — ABNORMAL LOW (ref 12.0–15.0)
MCH: 35.8 pg — ABNORMAL HIGH (ref 26.0–34.0)
MCHC: 36.3 g/dL — ABNORMAL HIGH (ref 30.0–36.0)
MCV: 98.5 fL (ref 80.0–100.0)
Platelets: 89 10*3/uL — ABNORMAL LOW (ref 150–400)
RBC: 2.71 MIL/uL — ABNORMAL LOW (ref 3.87–5.11)
RDW: 17.7 % — ABNORMAL HIGH (ref 11.5–15.5)
WBC: 5.1 10*3/uL (ref 4.0–10.5)
nRBC: 0 % (ref 0.0–0.2)

## 2018-12-23 LAB — MAGNESIUM
Magnesium: 1.6 mg/dL — ABNORMAL LOW (ref 1.7–2.4)
Magnesium: 2.5 mg/dL — ABNORMAL HIGH (ref 1.7–2.4)

## 2018-12-23 LAB — PHOSPHORUS: Phosphorus: 3.4 mg/dL (ref 2.5–4.6)

## 2018-12-23 LAB — HEMOGLOBIN AND HEMATOCRIT, BLOOD
HCT: 25.8 % — ABNORMAL LOW (ref 36.0–46.0)
Hemoglobin: 9 g/dL — ABNORMAL LOW (ref 12.0–15.0)

## 2018-12-23 LAB — PROTIME-INR
INR: 1.5 — ABNORMAL HIGH (ref 0.8–1.2)
Prothrombin Time: 17.9 seconds — ABNORMAL HIGH (ref 11.4–15.2)

## 2018-12-23 LAB — HIV ANTIBODY (ROUTINE TESTING W REFLEX): HIV Screen 4th Generation wRfx: NONREACTIVE

## 2018-12-23 LAB — TRIGLYCERIDES: Triglycerides: 49 mg/dL (ref <150)

## 2018-12-23 LAB — BLOOD PRODUCT ORDER (VERBAL) VERIFICATION

## 2018-12-23 MED ORDER — PHYTONADIONE 5 MG PO TABS
10.0000 mg | ORAL_TABLET | Freq: Once | ORAL | Status: DC
  Filled 2018-12-23: qty 2

## 2018-12-23 MED ORDER — CHLORHEXIDINE GLUCONATE CLOTH 2 % EX PADS
6.0000 | MEDICATED_PAD | Freq: Every day | CUTANEOUS | Status: DC
  Administered 2018-12-24 – 2018-12-28 (×5): 6 via TOPICAL

## 2018-12-23 MED ORDER — CALCIUM GLUCONATE-NACL 1-0.675 GM/50ML-% IV SOLN
1.0000 g | Freq: Once | INTRAVENOUS | Status: AC
  Administered 2018-12-23: 1000 mg via INTRAVENOUS
  Filled 2018-12-23: qty 50

## 2018-12-23 MED ORDER — VITAMIN K1 10 MG/ML IJ SOLN
10.0000 mg | Freq: Once | INTRAVENOUS | Status: AC
  Administered 2018-12-23: 10 mg via INTRAVENOUS
  Filled 2018-12-23: qty 1

## 2018-12-23 MED ORDER — POTASSIUM CHLORIDE 10 MEQ/100ML IV SOLN: 10.0000 meq | INTRAVENOUS | Status: DC

## 2018-12-23 MED ORDER — POTASSIUM CHLORIDE 10 MEQ/100ML IV SOLN
10.0000 meq | INTRAVENOUS | Status: AC
  Administered 2018-12-23 (×6): 10 meq via INTRAVENOUS
  Filled 2018-12-23 (×6): qty 100

## 2018-12-23 MED ORDER — MAGNESIUM SULFATE 2 GM/50ML IV SOLN
2.0000 g | Freq: Once | INTRAVENOUS | Status: AC
  Administered 2018-12-23: 2 g via INTRAVENOUS
  Filled 2018-12-23: qty 50

## 2018-12-23 MED ORDER — ORAL CARE MOUTH RINSE
15.0000 mL | Freq: Two times a day (BID) | OROMUCOSAL | Status: DC
  Administered 2018-12-23: 15 mL via OROMUCOSAL

## 2018-12-23 NOTE — Procedures (Signed)
Extubation Procedure Note  Patient Details:   Name: Michelle Schneider DOB: 10-01-1960 MRN: [id number]   Airway Documentation:    Vent end date: 12/23/18 Vent end time: 1155   Evaluation  O2 sats: stable throughout Complications: No apparent complications Patient did tolerate procedure well. Bilateral Breath Sounds: Clear   Yes   Patient was extubated to a 4L [place] without any complications, dyspnea or stridor noted. Positive cuff leak.  Cobb, Eddie North 12/23/2018, 11:55 PM

## 2018-12-23 NOTE — Progress Notes (Signed)
CRITICAL VALUE ALERT  Critical Value: K 2.4  Date & Time Notied:  12/23/2018 @   Provider Notified: CCM MD  Orders Received/Actions taken: New orders received, see MAR

## 2018-12-23 NOTE — Progress Notes (Addendum)
Subjective: Patient reports (vent)  Objective: Vital signs in last 24 hours: Temp:  [98.2 F (36.8 C)-100.4 F (38 C)] 98.6 F (37 C) (10/09 0800) Pulse Rate:  [49-100] 56 (10/09 0804) Resp:  [13-24] 15 (10/09 0804) BP: (99-161)/(63-105) 125/81 (10/09 0804) SpO2:  [88 %-100 %] 100 % (10/09 0804) Arterial Line BP: (112-123)/(62-79) 123/79 (10/09 0800) FiO2 (%):  [30 %-40 %] 30 % (10/09 0804) Weight:  [40.4 kg-44 kg] 40.4 kg (10/09 0500)  Intake/Output from previous day: 10/08 0701 - 10/09 0700 In: 4103.7 [I.V.:1234.8; Blood:2465.3; IV Piggyback:403.6] Out: 2595 [Urine:2205; Drains:90; Blood:300] Intake/Output this shift: Total I/O In: 411.2 [I.V.:12; IV Piggyback:399.2] Out: 275 [Urine:275]  Vent. Following commands all extremities. Follow-up CT looks good on Dr. Fredrich Birks review.  Drain ~57ml overnight. Incision well-approximated without significant drainage.  Lab Results: Recent Labs    12/22/18 1355  12/23/18 0126 12/23/18 0519  WBC 4.7  --   --  5.1  HGB 10.5*   < > 9.0* 9.7*  HCT 29.7*   < > 25.8* 26.7*  PLT 52*  --   --  89*   < > = values in this interval not displayed.   BMET Recent Labs    12/22/18 2146 12/22/18 2318 12/23/18 0519  NA 134* 140 137  K 2.4* 2.6* 3.1*  CL 100  --  102  CO2 21*  --  21*  GLUCOSE 122*  --  129*  BUN <5*  --  <5*  CREATININE 0.38*  --  0.40*  CALCIUM 8.1*  --  8.3*    Studies/Results: Ct Head Wo Contrast  Addendum Date: 12/22/2018   ADDENDUM REPORT: 12/22/2018 12:43 ADDENDUM: Critical Value/emergent results were called by telephone at the time of interpretation on 12/22/2018 at 1234 hours to provider Dr. Derwood Kaplan , who verbally acknowledged these results. Electronically Signed   By: Odessa Fleming M.D.   On: 12/22/2018 12:43   Result Date: 12/22/2018 CLINICAL DATA:  58 year old female status post fall at home with hematoma about the right eye. Neck bruising. EXAM: CT HEAD WITHOUT CONTRAST TECHNIQUE: Contiguous axial images  were obtained from the base of the skull through the vertex without intravenous contrast. COMPARISON:  None. FINDINGS: Brain: Mixed density but mostly hyperdense subdural hematoma tracking throughout the left hemisphere ranging from 7-10 millimeters in thickness. Relatively mild mass effect on the left hemisphere with rightward midline shift of 3 millimeters. Small volume of blood layering on the left tentorium. Trace blood also along the falx. Superimposed small hemorrhagic contusions in the anterior anterior left frontal lobe (sagittal image 32) and the superior left temporal gyrus (image 13), the latter is larger measuring 13 millimeters in diameter with mild regional edema. No intraventricular hemorrhage or ventriculomegaly. Basilar cisterns remain patent. No right side or subarachnoid hemorrhage. No superimposed No cortically based acute infarct identified. Vascular: No suspicious intracranial vascular hyperdensity. Skull: No fracture identified. Sinuses/Orbits: Visualized paranasal sinuses and mastoids are clear. Other: Broad-based posterior scalp hematoma, eccentric to the right. No underlying calvarium fracture identified. Partially visible right inferior and lateral periorbital scalp and face hematoma/contusion. The right globe and other intraorbital soft tissues remain normal. IMPRESSION: 1. Positive for posttraumatic intracranial hemorrhage: - left hemisphere Subdural Hematoma up to 10 mm thickness. - at least 3 small hemorrhagic contusions in the anterior left frontal and superior left temporal lobes. - no subarachnoid or intraventricular hemorrhage identified. 2. Rightward midline shift of 3 millimeters. Basilar cisterns remain patent. 3. Large scalp hematoma but no skull fracture identified.  Electronically Signed: By: Genevie Ann M.D. On: 12/22/2018 12:31   Ct Cervical Spine Wo Contrast  Result Date: 12/22/2018 CLINICAL DATA:  58 year old female status post fall at home with hematoma about the right  eye. Neck bruising. EXAM: CT CERVICAL SPINE WITHOUT CONTRAST TECHNIQUE: Multidetector CT imaging of the cervical spine was performed without intravenous contrast. Multiplanar CT image reconstructions were also generated. COMPARISON:  Head CT today reported separately. FINDINGS: Alignment: Straightening of cervical lordosis. Cervicothoracic junction alignment is within normal limits. Bilateral posterior element alignment is within normal limits. Skull base and vertebrae: Visualized skull base is intact. No atlanto-occipital dissociation. No acute osseous abnormality identified. No acute osseous abnormality identified. Soft tissues and spinal canal: No prevertebral fluid or swelling. No visible canal hematoma. Left side subdural hematoma redemonstrated in the visible lower brain. Confluent soft tissue stranding along the superficial right lateral neck on series 12, image 118 is compatible with posttraumatic hematoma or contusion. Negative left-side noncontrast neck soft tissues. Disc levels: Age-appropriate cervical spine degeneration. No spinal stenosis suspected. Upper chest: Visible upper thoracic levels appear intact. Negative lung apices and noncontrast thoracic inlet. IMPRESSION: 1. Superficial right neck soft tissue contusion/hematoma. 2. No acute traumatic injury identified in the cervical spine. 3. See also Head CT, reported separately. Electronically Signed   By: Genevie Ann M.D.   On: 12/22/2018 12:38   Dg Pelvis Portable  Result Date: 12/22/2018 CLINICAL DATA:  Fall. EXAM: PORTABLE PELVIS 1-2 VIEWS COMPARISON:  None. FINDINGS: There is no evidence of pelvic fracture or diastasis. No pelvic bone lesions are seen. IMPRESSION: Negative. Electronically Signed   By: Marijo Conception M.D.   On: 12/22/2018 13:15   Dg Chest Port 1 View  Result Date: 12/22/2018 CLINICAL DATA:  Intubation. EXAM: PORTABLE CHEST 1 VIEW COMPARISON:  Chest x-ray from same day. FINDINGS: Interval placement of an endotracheal tube with  the tip 3.3 cm above the carina. Probe within the esophagus. The heart size and mediastinal contours are within normal limits. Normal pulmonary vascularity. New mild left basilar atelectasis. No focal consolidation, pleural effusion, or pneumothorax. No acute osseous abnormality. IMPRESSION: 1. Appropriately positioned endotracheal tube. 2. New mild left basilar atelectasis. Electronically Signed   By: Titus Dubin M.D.   On: 12/22/2018 21:40   Dg Chest Portable 1 View  Result Date: 12/22/2018 CLINICAL DATA:  [place] today.  Altered mental status. EXAM: PORTABLE CHEST 1 VIEW COMPARISON:  None. FINDINGS: The cardiac silhouette, mediastinal and hilar contours are within normal limits. The lungs are clear. Mild eventration of the right hemidiaphragm. No pleural effusions. No pneumothorax. The bony thorax is intact. No definite rib fractures. Remote right shoulder hardware. IMPRESSION: No acute cardiopulmonary findings and intact bony thorax. Electronically Signed   By: Marijo Sanes M.D.   On: 12/22/2018 13:15    Assessment/Plan:   LOS: 1 day  Critical Care to wean and extubate.   Verdis Prime 12/23/2018, 8:29 AM   Repeat Head CT looks great.  No re-accumulation of blood.

## 2018-12-23 NOTE — Evaluation (Signed)
Occupational Therapy Evaluation Patient Details Name: Michelle Schneider MRN: [id number] DOB: 06/24/1960 Today's Date: 12/23/2018    History of Present Illness Pt is a 58 y/o female admitted after falling down the steps and is now s/p craniotomy subdural hematoma evacuation. PMH including but not limited to Raynauds, HLD, Autoimmune hepatitis with cirrhosis.   Clinical Impression   Pt admitted with above diagnoses, presenting with limitations in cognition, dynamic balance, and strength that limit ability to engage in BADL at desired level of ind. 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 She helped to care for her brother whom had a stroke. At time of eval, pt is min guard for bed mobility and min A +2 for safety and lines for functional mobility around room and to BR. Pt shows deficits in executive functioning skills that impair BADL independence. Given current status, recommend CIR at d/c to return to PLOF. Will continue to follow per POC listed below.     Follow Up Recommendations  CIR    Equipment Recommendations  Other (comment)(TBD)    Recommendations for Other Services       Precautions / Restrictions Precautions Precautions: Fall Precaution Comments: jp drain top of head Restrictions Weight Bearing Restrictions: No      Mobility Bed Mobility Overal bed mobility: Needs Assistance Bed Mobility: Supine to Sit     Supine to sit: Min guard     General bed mobility comments: min guard for safety  Transfers Overall transfer level: Needs assistance Equipment used: 1 person hand held assist;2 person hand held assist Transfers: Sit to/from Stand Sit to Stand: Min assist;+2 physical assistance;+2 safety/equipment         General transfer comment: assistance for satbilization and safety to keep balance from EOB, to func mob in room and toilet t/f    Balance Overall balance assessment: Needs assistance Sitting-balance support: Feet supported Sitting  balance-Leahy Scale: Fair Sitting balance - Comments: close min guard for safety   Standing balance support: Single extremity supported;Bilateral upper extremity supported Standing balance-Leahy Scale: Poor Standing balance comment: reliant on external support                           ADL either performed or assessed with clinical judgement   ADL Overall ADL's : Needs assistance/impaired Eating/Feeding: NPO   Grooming: Minimal assistance;Cueing for sequencing;Standing;Cueing for safety Grooming Details (indicate cue type and reason): cues for sequencing through oral care. Cues to use cup and not lean over with head near sink (bleeding and needing dressing change and with drain) Upper Body Bathing: Minimal assistance;Sitting;Cueing for sequencing;Cueing for safety   Lower Body Bathing: Minimal assistance;Sit to/from stand;Sitting/lateral leans;Cueing for sequencing;Cueing for safety   Upper Body Dressing : Minimal assistance;Sitting;Cueing for sequencing;Cueing for safety   Lower Body Dressing: Minimal assistance;Sit to/from stand;Sitting/lateral leans;Cueing for sequencing;Cueing for safety   Toilet Transfer: Minimal assistance;Regular Toilet;Cueing for safety;Cueing for sequencing Toilet Transfer Details (indicate cue type and reason): attempting to sit before over toilet safely, needing min A to keep balance Toileting- Clothing Manipulation and Hygiene: Set up;Sitting/lateral lean;Sit to/from stand       Functional mobility during ADLs: Minimal assistance;+2 for safety/equipment;Cueing for safety;Cueing for sequencing General ADL Comments: ltd by cognitive defcitis, decreased dynamic balance, and generalized weakness     Vision Baseline Vision/History: No visual deficits Patient Visual Report: No change from baseline Additional Comments: will continue to assess, functional for evaluation but difficult to assess with cognition  Perception     Praxis       Pertinent Vitals/Pain Pain Assessment: Faces Faces Pain Scale: Hurts a little bit Pain Location: jaw Pain Descriptors / Indicators: Sore Pain Intervention(s): Monitored during session     Hand Dominance     Extremity/Trunk Assessment Upper Extremity Assessment Upper Extremity Assessment: Generalized weakness   Lower Extremity Assessment Lower Extremity Assessment: Defer to PT evaluation   Cervical / Trunk Assessment Cervical / Trunk Assessment: Normal   Communication Communication Communication: No difficulties   Cognition Arousal/Alertness: Awake/alert Behavior During Therapy: Impulsive Overall Cognitive Status: Impaired/Different from baseline Area of Impairment: Orientation;Attention;Memory;Following commands;Safety/judgement;Awareness;Problem solving                 Orientation Level: Disoriented to;Situation Current Attention Level: Sustained Memory: Decreased short-term memory;Decreased recall of precautions Following Commands: Follows one step commands with increased time;Follows multi-step commands inconsistently Safety/Judgement: Decreased awareness of deficits;Decreased awareness of safety Awareness: Intellectual Problem Solving: Slow processing;Decreased initiation;Difficulty sequencing;Requires verbal cues General Comments: difficulty sequencing brushing teeth- putting water in mouth and spitting prior to brushing and stating "I dont see anything" then trying to rinse toothbrush by putting water through her straw.   General Comments       Exercises     Shoulder Instructions      Home Living Family/patient expects to be discharged to:: Private residence Living Arrangements: Other relatives;Other (Comment)(lives with brother) Available Help at Discharge: Family;Available 24 hours/day;Other (Comment)(brother has caregiver 6 days a week for ~8 hours per day) Type of Home: House Home Access: Stairs to enter [employer] of Steps:  2 Entrance Stairs-Rails: Right;Left Home Layout: Two level Alternate Level Stairs-Number of Steps: chair lift   Bathroom Shower/Tub: Occupational psychologist: Standard     Home Equipment: Environmental consultant - 2 wheels;Cane - single point;Shower seat   Additional Comments: brother uses the RW and cane      Prior Functioning/Environment Level of Independence: Independent        Comments: drives, "semi-retired" as a Financial planner         OT Problem List: Decreased strength;Decreased knowledge of use of DME or AE;Decreased knowledge of precautions;Decreased activity tolerance;Decreased cognition;Impaired balance (sitting and/or standing);Decreased safety awareness;Pain      OT Treatment/Interventions: Self-care/ADL training;Therapeutic exercise;Patient/family education;Neuromuscular education;Balance training;Therapeutic activities;DME and/or AE instruction;Cognitive remediation/compensation    OT Goals(Current goals can be found in the care plan section) Acute Rehab OT Goals Patient Stated Goal: return to independence OT Goal Formulation: With patient Time For Goal Achievement: 01/06/19 Potential to Achieve Goals: Good  OT Frequency: Min 2X/week   Barriers to D/C:            Co-evaluation PT/OT/SLP Co-Evaluation/Treatment: Yes Reason for Co-Treatment: For patient/therapist safety;Necessary to address cognition/behavior during functional activity;To address functional/ADL transfers PT goals addressed during session: Mobility/safety with mobility;Balance;Strengthening/ROM OT goals addressed during session: ADL's and self-care;Strengthening/ROM      AM-PAC OT "6 Clicks" Daily Activity     Outcome Measure Help from another person eating meals?: Total(NPO) Help from another person taking care of personal grooming?: A Little Help from another person toileting, which includes using toliet, bedpan, or urinal?: A Little Help from another person bathing (including washing,  rinsing, drying)?: A Little Help from another person to put on and taking off regular upper body clothing?: A Little Help from another person to put on and taking off regular lower body clothing?: A Little 6 Click Score: 16   End of Session Equipment Utilized During Treatment: Gait  belt Nurse Communication: Mobility status;Other (comment)(in need of dressing change)  Activity Tolerance: Patient tolerated treatment well Patient left: in chair;with call bell/phone within reach;with chair alarm set;Other (comment)(SLP coming in)  OT Visit Diagnosis: Unsteadiness on feet (R26.81);Other abnormalities of gait and mobility (R26.89);Muscle weakness (generalized) (M62.81);Other symptoms and signs involving cognitive function;Pain Pain - part of body: (jaw)                Time: 2774-1287 OT Time Calculation (min): 32 min Charges:  OT General Charges $OT Visit: 1 Visit OT Evaluation $OT Eval Moderate Complexity: 1 Mod  Dalphine Handing, MSOT, OTR/L [employer] OT/ Acute Relief OT [facility] Office: [phone]   Dalphine Handing 12/23/2018, 5:10 PM

## 2018-12-23 NOTE — Progress Notes (Signed)
Transported patient to and from CT without incident.  Patient vitals signs stable throughout.   Suctioned patient before and after.  Minor secretions noted, tan in color.  No distress at this time, will continue to monitor.

## 2018-12-23 NOTE — Progress Notes (Signed)
Rehab Admissions Coordinator Note:  Per PT and OT recommendation, this Patient was screened by Raechel Ache for appropriateness for an Inpatient Acute Rehab Consult.  At this time, we are recommending Inpatient Rehab consult. Please have attending MD place consult order if pt would like to be considered for CIR.   Raechel Ache 12/23/2018, 5:34 PM  I can be reached at [phone].

## 2018-12-23 NOTE — Progress Notes (Signed)
Initial Nutrition Assessment  RD working remotely.  DOCUMENTATION CODES:   Underweight, suspect malnutrition but unable to confirm at this time  INTERVENTION:   - Once diet advanced, Ensure Enlive po TID, each supplement provides 350 kcal and 20 grams of protein  NUTRITION DIAGNOSIS:   Inadequate oral intake related to inability to eat as evidenced by NPO status.  GOAL:   Patient will meet greater than or equal to 90% of their needs  MONITOR:   Diet advancement, Labs, Weight trends, Skin, I & O's  REASON FOR ASSESSMENT:   Other (low BMI)    ASSESSMENT:   58 year old female who presented to the ED on 10/8 for evaluation after a fall. PMH of autoimmune hepatitis, cirrhosis, esophageal varices, HLD. CT scan showing SDH.  10/8 - s/p craniotomy and evacuation of SDH 10/9 - extubated  Unable to speak with pt at this time. Pt extubated this AM. Weight history in chart is limited. Last available weight PTA is from April 2019 and indicates pt has lost weight since that time.  Pt remains NPO after extubation. RD monitor for diet advancement and will order oral nutrition supplements as appropriate.  Suspect malnutrition but unable to confirm at this time without diet history or NFPE.  Medications reviewed and include: Colace, IV abx, Keppra IVF: NS with KCl @ 75 ml/hr  Labs reviewed: potassium 3.1, magnesium 2.5, hemoglobin 9.7  UOP: 2205 ml x 24 hours JP drain: 90 ml x 24 hours I/O's: +1.9 L since admit  NUTRITION - FOCUSED PHYSICAL EXAM:  Unable to complete at this time. RD working remotely.  Diet Order:   Diet Order            Diet NPO time specified  Diet effective now              EDUCATION NEEDS:   Not appropriate for education at this time  Skin:  Skin Assessment: Skin Integrity Issues: Skin Integrity Issues: Incisions: left head  Last BM:  no documented BM  Height:   Ht Readings from Last 1 Encounters:  12/22/18 (S) 5\' 3"  (1.6 m)     Weight:   Wt Readings from Last 1 Encounters:  12/23/18 40.4 kg    Ideal Body Weight:  52.3 kg  BMI:  Body mass index is 15.78 kg/m.  Estimated Nutritional Needs:   Kcal:  1300-1500  Protein:  60-75 grams  Fluid:  1.3-1.5 L    Gaynell Face, MS, RD, LDN Inpatient Clinical Dietitian Pager: [phone] Weekend/After Hours: [phone]

## 2018-12-23 NOTE — Progress Notes (Addendum)
Daily Rounding Note  12/23/2018, 11:42 AM  LOS: 1 day   SUBJECTIVE:   Chief complaint: Cirrhosis, coagulopathy, thrombocytopenia  Doing much better this morning.  Watched as she was extubated.  She is alert.  Appropriate, oriented.  Her voice is a bit hoarse. She is able to tell me she does not drink liquor.  She knows the president and she knows she is in the hospital, she knows who she is.  OBJECTIVE:         Vital signs in last 24 hours:    Temp:  [98.2 F (36.8 C)-100.4 F (38 C)] 99.3 F (37.4 C) (10/09 1100) Pulse Rate:  [49-100] 76 (10/09 1121) Resp:  [13-26] 26 (10/09 1121) BP: (96-161)/(60-105) 108/73 (10/09 1121) SpO2:  [88 %-100 %] 100 % (10/09 1121) Arterial Line BP: (112-131)/(62-90) 131/90 (10/09 0900) FiO2 (%):  [30 %-40 %] 30 % (10/09 1121) Weight:  [40.4 kg-44 kg] 40.4 kg (10/09 0500) Last BM Date: (pta) Filed Weights   12/22/18 1151 12/22/18 2100 12/23/18 0500  Weight: 44 kg 40.4 kg 40.4 kg   General: Pleasant, calm, extensive right-sided facial and neck bruising. Bandage covering craniotomy site on left scalp. Heart: RRR. Chest: No labored breathing.  Some increased secretions sounding from the back of the throat. Abdomen: Soft.  Not tender, not distended. Extremities: Thin.  Feet are warm.  No edema Neuro/Psych: Does not, cooperative, calm.  Intake/Output from previous day: 10/08 0701 - 10/09 0700 In: 4103.7 [I.V.:1234.8; Blood:2465.3; IV Piggyback:403.6] Out: 2595 [Urine:2205; Drains:90; Blood:300]  Intake/Output this shift: Total I/O In: 831.5 [I.V.:48.5; IV Piggyback:783.1] Out: 425 [Urine:425]  Lab Results: Recent Labs    12/22/18 1245 12/22/18 1355  12/22/18 2318 12/23/18 0126 12/23/18 0519  WBC SPECIMEN CLOTTED 4.7  --   --   --  5.1  HGB SPECIMEN CLOTTED 10.5*   < > 9.5* 9.0* 9.7*  HCT SPECIMEN CLOTTED 29.7*   < > 28.0* 25.8* 26.7*  PLT SPECIMEN CLOTTED 52*  --   --   --   89*   < > = values in this interval not displayed.   BMET Recent Labs    12/22/18 1245  12/22/18 2146 12/22/18 2318 12/23/18 0519  NA 129*   < > 134* 140 137  K 3.2*   < > 2.4* 2.6* 3.1*  CL 92*  --  100  --  102  CO2 24  --  21*  --  21*  GLUCOSE 122*  --  122*  --  129*  BUN 10  --  <5*  --  <5*  CREATININE 0.54  --  0.38*  --  0.40*  CALCIUM 9.6  --  8.1*  --  8.3*   < > = values in this interval not displayed.   LFT Recent Labs    12/22/18 1245  PROT 8.2*  ALBUMIN 4.1  AST 64*  ALT 28  ALKPHOS 69  BILITOT 6.9*   PT/INR Recent Labs    12/22/18 1245 12/23/18 0519  LABPROT 18.5* 17.9*  INR 1.6* 1.5*   Hepatitis Panel No results for input(s): HEPBSAG, HCVAB, HEPAIGM, HEPBIGM in the last 72 hours.  Studies/Results: Ct Head Wo Contrast  Result Date: 12/23/2018 CLINICAL DATA:  Follow-up subdural hematoma EXAM: CT HEAD WITHOUT CONTRAST TECHNIQUE: Contiguous axial images were obtained from the base of the skull through the vertex without intravenous contrast. COMPARISON:  Yesterday FINDINGS: Brain: Interval subdural hematoma evacuation from the left cerebral  convexity. Blood clot is decreased. Hemostatic material and blood products are seen along the inferior left frontal convexity where there is a 12 mm maximal thickness. Small parenchymal contusions with rim of edema, up to 15 mm in the left temporal operculum. Midline shift is similar to improved from prior, 2 mm at the anterior septum pellucidum. No infarct or entrapment Vascular: No hyperdense vessel or unexpected calcification. Skull: Unremarkable left-sided craniotomy with subgaleal drain. Right posterior scalp contusion. Sinuses/Orbits: Negative IMPRESSION: 1. Left subdural hematoma evacuation without evidence of Ree hemorrhage. Anteriorly, the subdural collection of residual blood products and hemostatic material measures 12 mm in thickness. 2. Stable parenchymal contusions on the left. Electronically Signed   By:  Monte Fantasia M.D.   On: 12/23/2018 09:19   Ct Head Wo Contrast  Addendum Date: 12/22/2018   ADDENDUM REPORT: 12/22/2018 12:43 ADDENDUM: Critical Value/emergent results were called by telephone at the time of interpretation on 12/22/2018 at 1234 hours to provider Dr. Varney Biles , who verbally acknowledged these results. Electronically Signed   By: Genevie Ann M.D.   On: 12/22/2018 12:43   Result Date: 12/22/2018 CLINICAL DATA:  58 year old female status post fall at home with hematoma about the right eye. Neck bruising. EXAM: CT HEAD WITHOUT CONTRAST TECHNIQUE: Contiguous axial images were obtained from the base of the skull through the vertex without intravenous contrast. COMPARISON:  None. FINDINGS: Brain: Mixed density but mostly hyperdense subdural hematoma tracking throughout the left hemisphere ranging from 7-10 millimeters in thickness. Relatively mild mass effect on the left hemisphere with rightward midline shift of 3 millimeters. Small volume of blood layering on the left tentorium. Trace blood also along the falx. Superimposed small hemorrhagic contusions in the anterior anterior left frontal lobe (sagittal image 32) and the superior left temporal gyrus (image 13), the latter is larger measuring 13 millimeters in diameter with mild regional edema. No intraventricular hemorrhage or ventriculomegaly. Basilar cisterns remain patent. No right side or subarachnoid hemorrhage. No superimposed No cortically based acute infarct identified. Vascular: No suspicious intracranial vascular hyperdensity. Skull: No fracture identified. Sinuses/Orbits: Visualized paranasal sinuses and mastoids are clear. Other: Broad-based posterior scalp hematoma, eccentric to the right. No underlying calvarium fracture identified. Partially visible right inferior and lateral periorbital scalp and face hematoma/contusion. The right globe and other intraorbital soft tissues remain normal. IMPRESSION: 1. Positive for posttraumatic  intracranial hemorrhage: - left hemisphere Subdural Hematoma up to 10 mm thickness. - at least 3 small hemorrhagic contusions in the anterior left frontal and superior left temporal lobes. - no subarachnoid or intraventricular hemorrhage identified. 2. Rightward midline shift of 3 millimeters. Basilar cisterns remain patent. 3. Large scalp hematoma but no skull fracture identified. Electronically Signed: By: Genevie Ann M.D. On: 12/22/2018 12:31   Ct Cervical Spine Wo Contrast  Result Date: 12/22/2018 CLINICAL DATA:  58 year old female status post fall at home with hematoma about the right eye. Neck bruising. EXAM: CT CERVICAL SPINE WITHOUT CONTRAST TECHNIQUE: Multidetector CT imaging of the cervical spine was performed without intravenous contrast. Multiplanar CT image reconstructions were also generated. COMPARISON:  Head CT today reported separately. FINDINGS: Alignment: Straightening of cervical lordosis. Cervicothoracic junction alignment is within normal limits. Bilateral posterior element alignment is within normal limits. Skull base and vertebrae: Visualized skull base is intact. No atlanto-occipital dissociation. No acute osseous abnormality identified. No acute osseous abnormality identified. Soft tissues and spinal canal: No prevertebral fluid or swelling. No visible canal hematoma. Left side subdural hematoma redemonstrated in the visible lower brain.  Confluent soft tissue stranding along the superficial right lateral neck on series 12, image 118 is compatible with posttraumatic hematoma or contusion. Negative left-side noncontrast neck soft tissues. Disc levels: Age-appropriate cervical spine degeneration. No spinal stenosis suspected. Upper chest: Visible upper thoracic levels appear intact. Negative lung apices and noncontrast thoracic inlet. IMPRESSION: 1. Superficial right neck soft tissue contusion/hematoma. 2. No acute traumatic injury identified in the cervical spine. 3. See also Head CT,  reported separately. Electronically Signed   By: Genevie Ann M.D.   On: 12/22/2018 12:38   Dg Pelvis Portable  Result Date: 12/22/2018 CLINICAL DATA:  Fall. EXAM: PORTABLE PELVIS 1-2 VIEWS COMPARISON:  None. FINDINGS: There is no evidence of pelvic fracture or diastasis. No pelvic bone lesions are seen. IMPRESSION: Negative. Electronically Signed   By: Marijo Conception M.D.   On: 12/22/2018 13:15   Dg Chest Port 1 View  Result Date: 12/22/2018 CLINICAL DATA:  Intubation. EXAM: PORTABLE CHEST 1 VIEW COMPARISON:  Chest x-ray from same day. FINDINGS: Interval placement of an endotracheal tube with the tip 3.3 cm above the carina. Probe within the esophagus. The heart size and mediastinal contours are within normal limits. Normal pulmonary vascularity. New mild left basilar atelectasis. No focal consolidation, pleural effusion, or pneumothorax. No acute osseous abnormality. IMPRESSION: 1. Appropriately positioned endotracheal tube. 2. New mild left basilar atelectasis. Electronically Signed   By: Titus Dubin M.D.   On: 12/22/2018 21:40   Dg Chest Portable 1 View  Result Date: 12/22/2018 CLINICAL DATA:  [place] today.  Altered mental status. EXAM: PORTABLE CHEST 1 VIEW COMPARISON:  None. FINDINGS: The cardiac silhouette, mediastinal and hilar contours are within normal limits. The lungs are clear. Mild eventration of the right hemidiaphragm. No pleural effusions. No pneumothorax. The bony thorax is intact. No definite rib fractures. Remote right shoulder hardware. IMPRESSION: No acute cardiopulmonary findings and intact bony thorax. Electronically Signed   By: Marijo Sanes M.D.   On: 12/22/2018 13:15   Scheduled Meds:  chlorhexidine gluconate (MEDLINE KIT)  15 mL Mouth Rinse BID   [START ON 12/24/2018] Chlorhexidine Gluconate Cloth  6 each Topical Q0600   docusate sodium  100 mg Oral BID   mouth rinse  15 mL Mouth Rinse BID   pantoprazole (PROTONIX) IV  40 mg Intravenous QHS   Continuous  Infusions:  0.9 % NaCl with KCl 20 mEq / L Stopped (12/23/18 0516)    ceFAZolin (ANCEF) IV 1 g (12/23/18 1159)   levETIRAcetam 500 mg (12/23/18 1027)   PRN Meds:.acetaminophen **OR** acetaminophen, bisacodyl, fentaNYL (SUBLIMAZE) injection, fentaNYL (SUBLIMAZE) injection, labetalol, magnesium gluconate, ondansetron **OR** ondansetron (ZOFRAN) IV, polyethylene glycol, sodium phosphate  ASSESMENT:   *    Subdural hematoma. 12/22/2018 craniotomy with evacuation of subdural hematoma. No evidence for rebleeding, some residual blood products and hemostatic material on head CT 10/9.  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  T bili 6.9, AST 64, otherwise normal LFTs and ammonia level.  *     Coagulopathy, s/p FFP x 2, cryoprecipitate x 1.  INR 1.6 >> 1.5  *      Thrombocytopenia.   52 >> 2 units platelets >> 98.  *    Hypokalemia.  Proved but not resolved.  *    Normocytic anemia.  Hgb 6.8 >> 1 PRBC >> 9.7 Macrocytic.   PLAN   *    Supportive care.    *    Does not need IV Protonix, as  she does not have GI issues related to acid reflux.  *    Protocol following extubation is to begin p.o. in 4 hours, agree with this plan.    Azucena Freed  12/23/2018, 11:42 AM Phone [phone]   Attending physician's note   I have taken an interval history, reviewed the chart and examined the patient. I agree with the Advanced Practitioner's note, impression and recommendations.   Doing very well this afternoon. Total bili expected to increase as the hematoma resolves. Coagulopathy corrected by FFP and cryoprecipitate. It is post platelet transfusion.  No new recommendations from GI standpoint. Please call us if with any questions. Follow-up in GI clinic as outpatient.  Discussed with patient's daughter and patient in detail.  Carmell Austria, MD [facility] [phone].

## 2018-12-23 NOTE — Progress Notes (Addendum)
PULMONARY / CRITICAL CARE MEDICINE   NAME:  Michelle Schneider, MRN:  [id number], DOB:  04/29/1960, LOS: 1 ADMISSION DATE:  12/22/2018, CONSULTATION DATE:  12/22/2018 REFERRING MD:  Dr. Lilyan Punt, CHIEF COMPLAINT:  Headache  BRIEF HISTORY:    58 yo with SDH after falling at home.   Pt confused and not able to provide history.  Taken to OR for L subdural crani hematoma evacuation.   HISTORY OF PRESENT ILLNESS   58 yo female with hx of autoimmune hepatitis with cirrhosis fell at home few days ago.  Caregiver for her brother noticed more confusion and difficulty with speech.  Brought to ER.  Found to have SDH with midline shift.  Seen by neurosurgery and GI.  Taken to OR for L subdural crani hematoma evacuation.  SIGNIFICANT PAST MEDICAL HISTORY   Raynauds, HLD, Autoimmune hepatitis with cirrhosis  SIGNIFICANT EVENTS:  10/8 Admit, to OR for L subdural crani hematoma evacuation. EBL 300 ml.  S/p 2 U FFP, 2 U RBCs, and 10 pk platelets preop,    STUDIES:   CT head 10/8 >> Lt SDH with 3 mm Rt to Lt shift, large scalp hematoma 10/9 CT Head>> CULTURES:  MRSA surveillance negative SARS Coronavirus 2 negative   ANTIBIOTICS:  SCIP Cefazolin 10/8 SCIP Vancomycin 10/8  LINES/TUBES:  ETT 10/8> R radial Aline 10/8> L scalp JP drain>  CONSULTANTS:  Neurosurgery GI PCCM   SUBJECTIVE:  On low dose propofol, following commands, attempting to talk with ETT in place.   CONSTITUTIONAL: BP 125/81   Pulse (!) 56   Temp 98.6 F (37 C)   Resp 15   Ht (S) 5\' 3"  (1.6 m) Comment: Measured pt x3  @ 63"  Wt 40.4 kg   SpO2 100%   BMI 15.78 kg/m   I/O last 3 completed shifts: In: 4103.7 [I.V.:1234.8; Blood:2465.3; IV Piggyback:403.6] Out: 2595 [Urine:2205; Drains:90; Blood:300]     Vent Mode: PRVC FiO2 (%):  [30 %-40 %] 30 % Set Rate:  [15 bmp] 15 bmp Vt Set:  [410 mL] 410 mL PEEP:  [5 cmH20] 5 cmH20 Plateau Pressure:  [14 cmH20-15 cmH20] 15 cmH20  PHYSICAL EXAM: General: Thin,  well-developed, moving head back and forth on bed HENT: Large L scalp incision site CDI. L crani JP drain with blood drainage. R periorbital ecchymosis, R neck ecchymosis. PERRL. Moist mucus membranes Neck: No JVD. Trachea midline.  CV: RRR. S1S2. No MRG. +2 distal pulses Lungs: BBS present, clear, FNL, symmetrical, vent supported ABD: +BS x4. SNT/ND. No masses, guarding or rigidity GU: Foley EXT: MAE well. No edema Skin: PWD. In tact aside from scalp surgical wound. No rashes or lesions Neuro:opens eyes when name is called, MAE symmetrically on command, able to nod to yes/no questions      RESOLVED PROBLEM LIST   ASSESSMENT AND PLAN   Acute respiratory failure 2/2 Traumatic SDH Continue ventilator support to prevent eminent deterioration and further organ dysfunction from hypoxemia and hypercarbia.   Patient is at risk for sudden hypoxia, barotrauma and hemodynamic compromise.   Maintain SpO2 greater than or equal to 90%. Head of bed elevated 30 degrees. Plateau pressures less than 30 cm H20.  Follow chest x-ray, ABG.   SAT/SBT as tolerated. Goal to extubate today  Bronchial hygiene. RT/bronchodilator protocol.  Traumatic SDH. -  Per neurosurgery - IBP monitoring   Hx of autoimmune hepatitis. NH4 normal. No LFTs this am - GI following - daily CMP - Vitamin K 10mg  PO now  Coagulopathy. - received FFP, PLT. RBCs - Vitamin K 10mg  PO now  - daily INR   Thrombocytopenia in setting of cirrhosis. Improved - follow with CBC  Hyponatremia. Resolved   Hypokalemia.  -continue to replace - follow with CMP    SUMMARY OF TODAY'S PLAN:  Goal for vent liberation Best Practice / Goals of Care / Disposition.   DVT PROPHYLAXIS: SCDs SUP: Protonix NUTRITION: NPO. TF if unable to extubate MOBILITY: Bed rest GOALS OF CARE: Full code DISPOSITION ICU  LABS reviewed in EMR.       The patient is critically ill with respiratory failure 2/2 traumatic SDH. She requires ICU for  high complexity decision making, titration of high alert medications, ventilator management, titration of oxygen and interpretation of advanced monitoring.    I personally spent 45 minutes providing critical care services including personally reviewing test results, discussing care with nursing staff/other physicians and completing orders pertaining to this patient.  Time was exclusive to the patient and does not include time spent teaching or in procedures.  Voice recognition software was used in the production of this record.  Errors in interpretation may have been inadvertently missed during review.  , MSN, AGACNP  Pager [phone] or if no answer [phone] [facility]   PCCM attending:  58 year old female past medical history of immune hepatitis fell at home suffering a traumatic subdural hematoma.  Taken to the OR for evacuation by neurosurgery.  Transfer to the intensive care unit on mechanical ventilator.  Secondary to inability to protect airway postop.  Pulmonary critical care was consulted for recommendations management regarding mechanical ventilation and life support needs.  BP 108/73   Pulse 76   Temp 99.3 F (37.4 C)   Resp (!) 26   Ht (S) 5\' 3"  (1.6 m) Comment: Measured pt x3  @ 63"  Wt 40.4 kg   SpO2 100%   BMI 15.78 kg/m   General: Chronically ill-appearing very debilitated thin cachectic woman on mechanical ventilator life support. HEENT: Bruising periorbital, large incision of the left skull Dressing in place Heart: Regular rate rhythm S1-S2 Lungs: Bilateral ventilated breath sounds, no wheeze Extremities: Significant muscle wasting bilaterally Skin: Slight jaundice. Neuro: Off sedation awake alert following commands able to answer basic questions with head nods.  Labs reviewed:  Assessment: Acute hypoxemic respiratory failure requiring intubation mechanical ventilation following a traumatic subdural hematoma status  post surgical evacuation. History of autoimmune hepatitis Coagulopathy secondary to above Thrombocytopenia secondary above  Plan: Patient placed in SBT this morning.  Off all sedation. Patient currently tolerating SBT SAT. Make plans for liberation from mechanical ventilator. If she is strong enough we may be able to consider doing it today. Would like to see how she does for a little bit as she is just now coming off sedation. Postop care per neurology. IV vitamin K due to cirrhosis history and coagulopathy.  Pulmonary critical care appreciates consultation.  This patient is critically ill with multiple organ system failure; which, requires frequent high complexity decision making, assessment, support, evaluation, and titration of therapies. This was completed through the application of advanced monitoring technologies and extensive interpretation of multiple databases. During this encounter critical care time was devoted to patient care services described in this note for 34 minutes.  65, DO [facility] 12/23/2018 11:44 AM  Personal pager: [phone] If unanswered, please page CCM On-call: #[phone]

## 2018-12-23 NOTE — Evaluation (Signed)
Physical Therapy Evaluation Patient Details Name: Michelle Schneider MRN: [id number] DOB: 1961/01/05 Today's Date: 12/23/2018   History of Present Illness  Pt is a 58 y/o female admitted after falling down the steps and is now s/p craniotomy subdural hematoma evacuation. PMH including but not limited to Raynauds, HLD, Autoimmune hepatitis with cirrhosis.    Clinical Impression  Pt presented supine in bed with HOB elevated, awake and willing to participate in therapy session. Prior to admission, pt reported that she was independent with all functional mobility and ADLs. Pt lives with her brother in a two level home with a couple of steps to enter. Her brother requires her assistance with some ADLs as he has had a stroke several years ago. She stated that she has friends that could assist her upon d/c if needed. At the time of evaluation, pt required min guard for bed mobility, min A x2 for transfers and min-mod A x2 to ambulate within her room. Pt with very poor balance and coordination as well as cognitive deficits (see below). Pt is an excellent candidate for further intensive therapy services at CIR to maximize her independence with functional mobility prior to returning home with support. PT will continue to follow acutely to progress mobility as tolerated.   All VSS throughout with pt on RA.    Follow Up Recommendations CIR    Equipment Recommendations  None recommended by PT    Recommendations for Other Services Rehab consult     Precautions / Restrictions Precautions Precautions: Fall Precaution Comments: jp drain top of head Restrictions Weight Bearing Restrictions: No      Mobility  Bed Mobility Overal bed mobility: Needs Assistance Bed Mobility: Supine to Sit     Supine to sit: Min guard     General bed mobility comments: min guard for safety  Transfers Overall transfer level: Needs assistance Equipment used: 1 person hand held assist;2 person hand held  assist Transfers: Sit to/from Stand Sit to Stand: Min assist;+2 physical assistance;+2 safety/equipment         General transfer comment: assistance needed for stability and safety with transitional movement into standing from sitting EOB x1 and from sitting on toilet x1  Ambulation/Gait Ambulation/Gait assistance: Min assist;Mod assist;+2 safety/equipment Gait Distance (Feet): 30 Feet Assistive device: 1 person hand held assist;2 person hand held assist Gait Pattern/deviations: Step-through pattern;Decreased step length - right;Decreased step length - left;Decreased stride length;Shuffle Gait velocity: decreased   General Gait Details: pt with modest instability requiring constant min A and frequent mod A for balance; pt with posterior lean and unable to correct LOBs  Stairs            Wheelchair Mobility    Modified Rankin (Stroke Patients Only)       Balance Overall balance assessment: Needs assistance Sitting-balance support: Feet supported Sitting balance-Leahy Scale: Fair Sitting balance - Comments: close min guard for safety   Standing balance support: Single extremity supported;Bilateral upper extremity supported Standing balance-Leahy Scale: Poor Standing balance comment: min-mod A                             Pertinent Vitals/Pain Pain Assessment: Faces Faces Pain Scale: Hurts a little bit Pain Location: jaw Pain Descriptors / Indicators: Sore Pain Intervention(s): Monitored during session    Home Living Family/patient expects to be discharged to:: Private residence Living Arrangements: Other relatives;Other (Comment)(lives with brother) Available Help at Discharge: Family;Available 24 hours/day;Other (Comment)(brother has a  caregiver - 6 days/wk for ~8 hrs) Type of Home: House Home Access: Stairs to enter Entrance Stairs-Rails: Doctor, general practice of Steps: 2 Home Layout: Two level Home Equipment: Walker - 2 wheels;Cane -  single point;Shower seat(brother uses walker and cane)      Prior Function Level of Independence: Independent         Comments: drives, "semi-retired" as a Research officer, trade union        Extremity/Trunk Assessment   Upper Extremity Assessment Upper Extremity Assessment: Defer to OT evaluation    Lower Extremity Assessment Lower Extremity Assessment: Generalized weakness    Cervical / Trunk Assessment Cervical / Trunk Assessment: Normal  Communication   Communication: No difficulties  Cognition Arousal/Alertness: Awake/alert Behavior During Therapy: Impulsive Overall Cognitive Status: Impaired/Different from baseline Area of Impairment: Orientation;Attention;Memory;Following commands;Safety/judgement;Awareness;Problem solving                 Orientation Level: Disoriented to;Situation Current Attention Level: Sustained Memory: Decreased short-term memory;Decreased recall of precautions Following Commands: Follows one step commands with increased time;Follows multi-step commands inconsistently Safety/Judgement: Decreased awareness of deficits;Decreased awareness of safety Awareness: Intellectual Problem Solving: Slow processing;Decreased initiation;Difficulty sequencing;Requires verbal cues        General Comments      Exercises     Assessment/Plan    PT Assessment Patient needs continued PT services  PT Problem List Decreased strength;Decreased activity tolerance;Decreased balance;Decreased mobility;Decreased coordination;Decreased knowledge of use of DME;Decreased safety awareness;Decreased knowledge of precautions;Pain;Decreased cognition       PT Treatment Interventions DME instruction;Gait training;Stair training;Functional mobility training;Therapeutic activities;Therapeutic exercise;Balance training;Neuromuscular re-education;Cognitive remediation;Patient/family education    PT Goals (Current goals can be found in the Care Plan  section)  Acute Rehab PT Goals Patient Stated Goal: return to independence PT Goal Formulation: With patient Time For Goal Achievement: 01/06/19 Potential to Achieve Goals: Good    Frequency Min 4X/week   Barriers to discharge        Co-evaluation PT/OT/SLP Co-Evaluation/Treatment: Yes Reason for Co-Treatment: For patient/therapist safety;To address functional/ADL transfers PT goals addressed during session: Mobility/safety with mobility;Balance;Strengthening/ROM         AM-PAC PT "6 Clicks" Mobility  Outcome Measure Help needed turning from your back to your side while in a flat bed without using bedrails?: A Little Help needed moving from lying on your back to sitting on the side of a flat bed without using bedrails?: A Little Help needed moving to and from a bed to a chair (including a wheelchair)?: A Little Help needed standing up from a chair using your arms (e.g., wheelchair or bedside chair)?: A Little Help needed to walk in hospital room?: A Lot Help needed climbing 3-5 steps with a railing? : A Lot 6 Click Score: 16    End of Session Equipment Utilized During Treatment: Gait belt Activity Tolerance: Patient tolerated treatment well Patient left: in chair;with call bell/phone within reach;Other (comment)(SLP entering room to work with pt) Nurse Communication: Mobility status PT Visit Diagnosis: Other abnormalities of gait and mobility (R26.89)    Time: 1521-1600 PT Time Calculation (min) (ACUTE ONLY): 39 min   Charges:   PT Evaluation $PT Eval Moderate Complexity: 1 Mod PT Treatments $Therapeutic Activity: 8-22 mins        Deborah Chalk, PT, DPT  Acute Rehabilitation Services Pager [phone] Office [phone]    Alessandra Bevels Alcon 12/23/2018, 4:42 PM

## 2018-12-23 NOTE — Evaluation (Signed)
Clinical/Bedside Swallow Evaluation Patient Details  Name: Michelle Schneider MRN: [id number] Date of Birth: 01-10-1961  Today's Date: 12/23/2018 Time: SLP Start Time (ACUTE ONLY): 1552 SLP Stop Time (ACUTE ONLY): 1605 SLP Time Calculation (min) (ACUTE ONLY): 13 min  Past Medical History:  Past Medical History:  Diagnosis Date  . Autoimmune hepatitis (HCC)   . Cirrhosis (HCC)   . Esophageal varices (HCC)   . Family history of adverse reaction to anesthesia    cousin had a difficult time waking up after appendectomy  . Heart murmur    "diagnosed with a very slight heart murmur in 2001 but no other doctor has been able to hear it"  . Hyperlipidemia   . Raynaud's disease    Past Surgical History:  Past Surgical History:  Procedure Laterality Date  . ABDOMINAL HYSTERECTOMY    . CRANIOTOMY Left 12/22/2018   Procedure: CRANIOTOMY HEMATOMA EVACUATION SUBDURAL;  Surgeon: Maeola Harman, MD;  Location: [facility] OR;  Service: Neurosurgery;  Laterality: Left;  . ESOPHAGOGASTRODUODENOSCOPY (EGD) WITH PROPOFOL N/A 06/15/2017   Procedure: ESOPHAGOGASTRODUODENOSCOPY (EGD) WITH PROPOFOL;  Surgeon: Benancio Deeds, MD;  Location: [facility] ENDOSCOPY;  Service: Gastroenterology;  Laterality: N/A;  . ORIF HUMERUS FRACTURE Right 05/23/2015   Procedure: OPEN REDUCTION INTERNAL FIXATION (ORIF) RIGHT  PROXIMAL HUMERUS FRACTURE;  Surgeon: Francena Hanly, MD;  Location: [facility] OR;  Service: Orthopedics;  Laterality: Right;  . WISDOM TOOTH EXTRACTION     HPI:  58 yo female with hx of autoimmune hepatitis with cirrhosis fell at home few days ago.  Caregiver for her brother noticed more confusion and difficulty with speech.  Brought to ER.  Found to have SDH with midline shift.  Seen by neurosurgery and GI.  Plan for OR.   Assessment / Plan / Recommendation Clinical Impression  Pt reports mild odnophagia with po's. Her vocal quality is clear, strong without significant hoarseness. Oral control, mastication and propulsion within  functinal limits. She did not take large sips when prompted to multiple sips. No indications of airway intrusion and recommended she continue regular texture, thin liquids, pills with water. No need to follow for swallow however will request orders for speech-language-cognitive assessment given impairments present during evaluation.   SLP Visit Diagnosis: Dysphagia, unspecified (R13.10)    Aspiration Risk  Mild aspiration risk    Diet Recommendation Regular;Thin liquid   Liquid Administration via: Cup;Straw Medication Administration: Whole meds with liquid Supervision: Patient able to self feed Compensations: Slow rate;Small sips/bites;Minimize environmental distractions Postural Changes: Seated upright at 90 degrees    Other  Recommendations Oral Care Recommendations: Oral care BID   Follow up Recommendations None      Frequency and Duration            Prognosis        Swallow Study   General HPI: 58 yo female with hx of autoimmune hepatitis with cirrhosis fell at home few days ago.  Caregiver for her brother noticed more confusion and difficulty with speech.  Brought to ER.  Found to have SDH with midline shift.  Seen by neurosurgery and GI.  Plan for OR. Type of Study: Bedside Swallow Evaluation Previous Swallow Assessment: (none) Diet Prior to this Study: Regular;Thin liquids Temperature Spikes Noted: Yes Respiratory Status: Room air History of Recent Intubation: Yes Length of Intubations (days): (for surgery) Date extubated: 12/22/18 Behavior/Cognition: Alert;Cooperative;Pleasant mood;Requires cueing Oral Cavity Assessment: Within Functional Limits Oral Care Completed by SLP: No Oral Cavity - Dentition: Adequate natural dentition Vision: Functional for self-feeding Self-Feeding  Abilities: Able to feed self Patient Positioning: Upright in chair Baseline Vocal Quality: Normal Volitional Cough: Strong Volitional Swallow: Able to elicit    Oral/Motor/Sensory Function  Overall Oral Motor/Sensory Function: Within functional limits   Ice Chips Ice chips: Not tested   Thin Liquid Thin Liquid: Within functional limits Presentation: Cup;Straw    Nectar Thick Nectar Thick Liquid: Not tested   Honey Thick Honey Thick Liquid: Not tested   Puree Puree: Within functional limits   Solid     Solid: Within functional limits      Mick Sell, Orbie Pyo 12/23/2018,5:38 PM   Orbie Pyo [place].Ed Risk analyst [phone] Office [phone]

## 2018-12-24 DIAGNOSIS — J988 Other specified respiratory disorders: Secondary | ICD-10-CM

## 2018-12-24 LAB — COMPREHENSIVE METABOLIC PANEL
ALT: 20 U/L (ref 0–44)
AST: 37 U/L (ref 15–41)
Albumin: 2.9 g/dL — ABNORMAL LOW (ref 3.5–5.0)
Alkaline Phosphatase: 53 U/L (ref 38–126)
Anion gap: 9 (ref 5–15)
BUN: 5 mg/dL — ABNORMAL LOW (ref 6–20)
CO2: 22 mmol/L (ref 22–32)
Calcium: 8.5 mg/dL — ABNORMAL LOW (ref 8.9–10.3)
Chloride: 103 mmol/L (ref 98–111)
Creatinine, Ser: 0.47 mg/dL (ref 0.44–1.00)
GFR calc Af Amer: >60 mL/min (ref >60)
GFR calc non Af Amer: >60 mL/min (ref >60)
Glucose, Bld: 93 mg/dL (ref 70–99)
Potassium: 3.2 mmol/L — ABNORMAL LOW (ref 3.5–5.1)
Sodium: 134 mmol/L — ABNORMAL LOW (ref 135–145)
Total Bilirubin: 4.8 mg/dL — ABNORMAL HIGH (ref 0.3–1.2)
Total Protein: 6.1 g/dL — ABNORMAL LOW (ref 6.5–8.1)

## 2018-12-24 LAB — MAGNESIUM: Magnesium: 1.8 mg/dL (ref 1.7–2.4)

## 2018-12-24 LAB — PROTIME-INR
INR: 1.6 — ABNORMAL HIGH (ref 0.8–1.2)
Prothrombin Time: 18.6 seconds — ABNORMAL HIGH (ref 11.4–15.2)

## 2018-12-24 LAB — CBC
HCT: 25.8 % — ABNORMAL LOW (ref 36.0–46.0)
Hemoglobin: 9.1 g/dL — ABNORMAL LOW (ref 12.0–15.0)
MCH: 35.3 pg — ABNORMAL HIGH (ref 26.0–34.0)
MCHC: 35.3 g/dL (ref 30.0–36.0)
MCV: 100 fL (ref 80.0–100.0)
Platelets: 78 10*3/uL — ABNORMAL LOW (ref 150–400)
RBC: 2.58 MIL/uL — ABNORMAL LOW (ref 3.87–5.11)
RDW: 17.5 % — ABNORMAL HIGH (ref 11.5–15.5)
WBC: 5.1 10*3/uL (ref 4.0–10.5)
nRBC: 0 % (ref 0.0–0.2)

## 2018-12-24 MED ORDER — SODIUM CHLORIDE 0.9% FLUSH
10.0000 mL | Freq: Two times a day (BID) | INTRAVENOUS | Status: DC
  Administered 2018-12-24 – 2018-12-28 (×6): 10 mL

## 2018-12-24 MED ORDER — SODIUM CHLORIDE 0.9% FLUSH
10.0000 mL | INTRAVENOUS | Status: DC | PRN
  Administered 2018-12-26: 10 mL
  Filled 2018-12-24: qty 40

## 2018-12-24 NOTE — Progress Notes (Signed)
PCCM Progress Note  Subjective: Has mild headache.  Vital signs: BP (!) 138/94   Pulse 71   Temp 100.1 F (37.8 C) (Oral)   Resp 16   Ht (S) 5\' 3"  (1.6 m) Comment: Measured pt x3  @ 63"  Wt 41.9 kg   SpO2 97%   BMI 16.36 kg/m   Intake/outpt: I/O last 3 completed shifts: In: 5193.8 [P.O.:240; I.V.:2228.7; Blood:1422.3; IV Piggyback:1302.7] Out: 3110 [Urine:2630; Drains:180; Blood:300]  Physical exam:  General - alert Eyes - periorbital hematoma on Rt ENT - no sinus tenderness, no stridor Cardiac - regular rate/rhythm, no murmur Chest - equal breath sounds b/l, no wheezing or rales Abdomen - soft, non tender, + bowel sounds Extremities - no cyanosis, clubbing, or edema Skin - no rashes Neuro - normal strength, moves extremities, follows commands  Labs: CMP Latest Ref Rng & Units 12/24/2018 12/23/2018 12/22/2018  Glucose 70 - 99 mg/dL 93 129(H) -  BUN 6 - 20 mg/dL 5(L) <5(L) -  Creatinine 0.44 - 1.00 mg/dL 0.47 0.40(L) -  Sodium 135 - 145 mmol/L 134(L) 137 140  Potassium 3.5 - 5.1 mmol/L 3.2(L) 3.1(L) 2.6(LL)  Chloride 98 - 111 mmol/L 103 102 -  CO2 22 - 32 mmol/L 22 21(L) -  Calcium 8.9 - 10.3 mg/dL 8.5(L) 8.3(L) -  Total Protein 6.5 - 8.1 g/dL 6.1(L) - -  Total Bilirubin 0.3 - 1.2 mg/dL 4.8(H) - -  Alkaline Phos 38 - 126 U/L 53 - -  AST 15 - 41 U/L 37 - -  ALT 0 - 44 U/L 20 - -   CBC Latest Ref Rng & Units 12/24/2018 12/23/2018 12/23/2018  WBC 4.0 - 10.5 K/uL 5.1 5.1 -  Hemoglobin 12.0 - 15.0 g/dL 9.1(L) 9.7(L) 9.0(L)  Hematocrit 36.0 - 46.0 % 25.8(L) 26.7(L) 25.8(L)  Platelets 150 - 400 K/uL 78(L) 89(L) -    Assessment/plan:  Acute hypoxic respiratory failure with compromised airway. - successfully extubated w/o difficulty 10/9  Traumatic SDH s/p evacuation. - per neurosurgery  Autoimmune hepatitis with cirrhosis. - GI consulted  PCCM will sign off.  Please call if additional help needed while she is in ICU.  Chesley Mires, MD [facility]  Pulmonary/Critical Care 12/24/2018, 9:45 AM

## 2018-12-24 NOTE — Progress Notes (Addendum)
Patient ID: Michelle Schneider, female   DOB: 1960-09-23, 58 y.o.   MRN: [id number] BP (!) 179/91   Pulse 87   Temp 100.1 F (37.8 C) (Oral)   Resp 16   Ht (S) 5\' 3"  (1.6 m) Comment: Measured pt x3  @ 63"  Wt 41.9 kg   SpO2 95%   BMI 16.36 kg/m  Alert and oriented x 4 Wound is clean and dry No drift Full eom Tongue and uvula midline Moving all extremities well Drain with minimal drainage, will remove later today Doing well

## 2018-12-25 LAB — COMPREHENSIVE METABOLIC PANEL
ALT: 17 U/L (ref 0–44)
AST: 34 U/L (ref 15–41)
Albumin: 2.7 g/dL — ABNORMAL LOW (ref 3.5–5.0)
Alkaline Phosphatase: 50 U/L (ref 38–126)
Anion gap: 11 (ref 5–15)
BUN: 6 mg/dL (ref 6–20)
CO2: 22 mmol/L (ref 22–32)
Calcium: 8.2 mg/dL — ABNORMAL LOW (ref 8.9–10.3)
Chloride: 99 mmol/L (ref 98–111)
Creatinine, Ser: 0.46 mg/dL (ref 0.44–1.00)
GFR calc Af Amer: >60 mL/min (ref >60)
GFR calc non Af Amer: >60 mL/min (ref >60)
Glucose, Bld: 101 mg/dL — ABNORMAL HIGH (ref 70–99)
Potassium: 3.1 mmol/L — ABNORMAL LOW (ref 3.5–5.1)
Sodium: 132 mmol/L — ABNORMAL LOW (ref 135–145)
Total Bilirubin: 4.7 mg/dL — ABNORMAL HIGH (ref 0.3–1.2)
Total Protein: 6 g/dL — ABNORMAL LOW (ref 6.5–8.1)

## 2018-12-25 LAB — CBC
HCT: 26.7 % — ABNORMAL LOW (ref 36.0–46.0)
Hemoglobin: 9.4 g/dL — ABNORMAL LOW (ref 12.0–15.0)
MCH: 35.6 pg — ABNORMAL HIGH (ref 26.0–34.0)
MCHC: 35.2 g/dL (ref 30.0–36.0)
MCV: 101.1 fL — ABNORMAL HIGH (ref 80.0–100.0)
Platelets: 85 10*3/uL — ABNORMAL LOW (ref 150–400)
RBC: 2.64 MIL/uL — ABNORMAL LOW (ref 3.87–5.11)
RDW: 16.2 % — ABNORMAL HIGH (ref 11.5–15.5)
WBC: 5.3 10*3/uL (ref 4.0–10.5)
nRBC: 0 % (ref 0.0–0.2)

## 2018-12-25 LAB — MAGNESIUM: Magnesium: 1.7 mg/dL (ref 1.7–2.4)

## 2018-12-25 MED ORDER — LEVETIRACETAM 500 MG PO TABS
500.0000 mg | ORAL_TABLET | Freq: Two times a day (BID) | ORAL | Status: DC
  Administered 2018-12-25 – 2018-12-27 (×6): 500 mg via ORAL
  Filled 2018-12-25 (×6): qty 1

## 2018-12-25 NOTE — Progress Notes (Signed)
Patient ID: Michelle Schneider, female   DOB: 06-20-1960, 58 y.o.   MRN: [id number] BP 125/84   Pulse 73   Temp (!) 100.4 F (38 C) (Axillary)   Resp 17   Ht (S) 5\' 3"  (1.6 m) Comment: Measured pt x3  @ 63"  Wt 41.9 kg   SpO2 97%   BMI 16.36 kg/m  Alert and oriented x 4, speech is clear, Moving all extremities well Wound is healing well Will transfer to 4np

## 2018-12-25 NOTE — Progress Notes (Signed)
Pt tx to 4np14. Assessment stable. Report given to dorothy RN. No belongings to send with pt.

## 2018-12-26 LAB — COMPREHENSIVE METABOLIC PANEL
ALT: 16 U/L (ref 0–44)
AST: 28 U/L (ref 15–41)
Albumin: 2.6 g/dL — ABNORMAL LOW (ref 3.5–5.0)
Alkaline Phosphatase: 51 U/L (ref 38–126)
Anion gap: 9 (ref 5–15)
BUN: 7 mg/dL (ref 6–20)
CO2: 20 mmol/L — ABNORMAL LOW (ref 22–32)
Calcium: 8.4 mg/dL — ABNORMAL LOW (ref 8.9–10.3)
Chloride: 105 mmol/L (ref 98–111)
Creatinine, Ser: 0.41 mg/dL — ABNORMAL LOW (ref 0.44–1.00)
GFR calc Af Amer: >60 mL/min (ref >60)
GFR calc non Af Amer: >60 mL/min (ref >60)
Glucose, Bld: 100 mg/dL — ABNORMAL HIGH (ref 70–99)
Potassium: 3.3 mmol/L — ABNORMAL LOW (ref 3.5–5.1)
Sodium: 134 mmol/L — ABNORMAL LOW (ref 135–145)
Total Bilirubin: 4 mg/dL — ABNORMAL HIGH (ref 0.3–1.2)
Total Protein: 5.9 g/dL — ABNORMAL LOW (ref 6.5–8.1)

## 2018-12-26 LAB — CBC
HCT: 26.1 % — ABNORMAL LOW (ref 36.0–46.0)
Hemoglobin: 9.3 g/dL — ABNORMAL LOW (ref 12.0–15.0)
MCH: 35.2 pg — ABNORMAL HIGH (ref 26.0–34.0)
MCHC: 35.6 g/dL (ref 30.0–36.0)
MCV: 98.9 fL (ref 80.0–100.0)
Platelets: 94 10*3/uL — ABNORMAL LOW (ref 150–400)
RBC: 2.64 MIL/uL — ABNORMAL LOW (ref 3.87–5.11)
RDW: 15.6 % — ABNORMAL HIGH (ref 11.5–15.5)
WBC: 4.8 10*3/uL (ref 4.0–10.5)
nRBC: 0 % (ref 0.0–0.2)

## 2018-12-26 LAB — MAGNESIUM: Magnesium: 1.6 mg/dL — ABNORMAL LOW (ref 1.7–2.4)

## 2018-12-26 NOTE — Progress Notes (Signed)
Inpatient Rehab Admissions:  Inpatient Rehab Consult received.  I met with patient at the bedside for rehabilitation assessment and to discuss goals and expectations of an inpatient rehab admission.  We also discussed insurance and cost of rehab if pt's insurance is no longer active.  I will verify her 24/7 and attempt to verify her insurance for possible CIR admission today or tomorrow pending above, and pending bed availability.   Signed: Shann Medal, PT, DPT Admissions Coordinator [phone] 12/26/18  11:13 AM

## 2018-12-26 NOTE — Progress Notes (Signed)
Inpatient Rehab Admissions Coordinator:   Note updated therapy recommendations to follow up with home health therapies.  Pt and caregiver in agreement.  Will sign off at this time.   Shann Medal, PT, DPT Admissions Coordinator [phone] 12/26/18  2:56 PM

## 2018-12-26 NOTE — Progress Notes (Signed)
Physical Therapy Treatment Patient Details Name: ARDELLE Schneider MRN: [id number] DOB: [birth year] Today's Date: 12/26/2018    History of Present Illness Pt is a 58 y/o female admitted after falling down the steps and is now s/p craniotomy subdural hematoma evacuation. PMH including but not limited to Raynauds, HLD, Autoimmune hepatitis with cirrhosis.    PT Comments    Pt very pleasant and able to perform basic transfers and bed mobility without assist. Pt with improved gait tolerance to walking in hallway without UE support with one partial LOB. Pt reports she can stay downstairs in home and that brother is upstairs with chair lift and he currently has aid to assist with meals and dressing. Pt agreeable to change in D/C plan given current function with utilization of RW for gait given continued balance deficits. Will continue to follow to maximize independence.     Follow Up Recommendations  Home health PT     Equipment Recommendations  Rolling walker with 5" wheels    Recommendations for Other Services OT consult     Precautions / Restrictions Precautions Precautions: Fall    Mobility  Bed Mobility Overal bed mobility: Modified Independent                Transfers Overall transfer level: Needs assistance   Transfers: Sit to/from Stand Sit to Stand: Supervision         General transfer comment: supervision for lines and safety, pt without LOB with basic transfers. Pt able to stand from bed and toilet without physical assist  Ambulation/Gait Ambulation/Gait assistance: Min guard Gait Distance (Feet): 550 Feet Assistive device: IV Pole;None Gait Pattern/deviations: Step-through pattern;Decreased stride length   Gait velocity interpretation: >2.62 ft/sec, indicative of community ambulatory General Gait Details: pt initially taking a few steps without UE support with 1 partial LOb reaching out for environmental support, pt then utilizing IV pole for support for  gait for 200', pt then able to release the pole and walk remainder of distance without UE support with guarding for balance   Stairs             Wheelchair Mobility    Modified Rankin (Stroke Patients Only)       Balance Overall balance assessment: Needs assistance Sitting-balance support: No upper extremity supported;Feet supported Sitting balance-Leahy Scale: Good       Standing balance-Leahy Scale: Good   Single Leg Stance - Right Leg: 5 Single Leg Stance - Left Leg: 2 Tandem Stance - Right Leg: 10 Tandem Stance - Left Leg: 8 Rhomberg - Eyes Opened: 60 Rhomberg - Eyes Closed: 30   High Level Balance Comments: pt required assist to step into tandem stance but then able to hold, pt with noted balance deficit with single limb stance            Cognition Arousal/Alertness: Awake/alert Behavior During Therapy: WFL for tasks assessed/performed Overall Cognitive Status: Within Functional Limits for tasks assessed                                 General Comments: pt A&Ox 4, following all commands and aware of her balance deficits. Did not extensively challenge cognition but WNL for basic mobility and hygiene      Exercises      General Comments        Pertinent Vitals/Pain Pain Score: 3  Pain Location: top of head and neck Pain Descriptors / Indicators: Aching;Throbbing Pain  Intervention(s): Limited activity within patient's tolerance;Monitored during session;Premedicated before session;Repositioned    Home Living                      Prior Function            PT Goals (current goals can now be found in the care plan section) Progress towards PT goals: Progressing toward goals    Frequency    Min 3X/week      PT Plan Discharge plan needs to be updated    Co-evaluation              AM-PAC PT "6 Clicks" Mobility   Outcome Measure  Help needed turning from your back to your side while in a flat bed without  using bedrails?: None Help needed moving from lying on your back to sitting on the side of a flat bed without using bedrails?: None Help needed moving to and from a bed to a chair (including a wheelchair)?: A Little Help needed standing up from a chair using your arms (e.g., wheelchair or bedside chair)?: A Little Help needed to walk in hospital room?: A Little Help needed climbing 3-5 steps with a railing? : A Little 6 Click Score: 20    End of Session   Activity Tolerance: Patient tolerated treatment well Patient left: in chair;with call bell/phone within reach;with chair alarm set Nurse Communication: Mobility status PT Visit Diagnosis: Other abnormalities of gait and mobility (R26.89)     Time: 6503-5465 PT Time Calculation (min) (ACUTE ONLY): 21 min  Charges:  $Gait Training: 8-22 mins                     Maija Abner Greenspan, PT Acute Rehabilitation Services Pager: [phone] Office: [phone]    Maija B Prater 12/26/2018, 1:50 PM

## 2018-12-26 NOTE — Progress Notes (Addendum)
Subjective: Patient reports "I feel better today than yesterday. My face is just sore"  Objective: Vital signs in last 24 hours: Temp:  [98.5 F (36.9 C)-99.8 F (37.7 C)] 98.5 F (36.9 C) (10/12 0759) Pulse Rate:  [67-81] 79 (10/12 0759) Resp:  [14-20] 20 (10/12 0759) BP: (112-160)/(78-99) 156/94 (10/12 0759) SpO2:  [97 %-100 %] 99 % (10/12 0759) Weight:  [45.8 kg] 45.8 kg (10/12 0543)  Intake/Output from previous day: 10/11 0701 - 10/12 0700 In: 200 [P.O.:200] Out: -  Intake/Output this shift: No intake/output data recorded.  Awakens to voice. MAEW. No drift. Fluent speech. Reports facial and scalp soreness, improving. Incision with staples, no erythema, swelling, or drainage.  "Wobbly" when up to bathroom with SBA.  Lab Results: Recent Labs    12/25/18 0522 12/26/18 0508  WBC 5.3 4.8  HGB 9.4* 9.3*  HCT 26.7* 26.1*  PLT 85* 94*   BMET Recent Labs    12/25/18 0522 12/26/18 0508  NA 132* 134*  K 3.1* 3.3*  CL 99 105  CO2 22 20*  GLUCOSE 101* 100*  BUN 6 7  CREATININE 0.46 0.41*  CALCIUM 8.2* 8.4*    Studies/Results: No results found.  Assessment/Plan: Improving  LOS: 4 days  Continue supportive care. Mobilizing with therapies. Hopeful for CIR.   Michelle Schneider 12/26/2018, 8:00 AM  Patient is making excellent recovery.  Hopefully to CIR.

## 2018-12-27 MED ORDER — ENSURE ENLIVE PO LIQD
237.0000 mL | Freq: Three times a day (TID) | ORAL | Status: DC
  Administered 2018-12-27 (×2): 237 mL via ORAL

## 2018-12-27 NOTE — Plan of Care (Signed)
  Problem: Education: Goal: Knowledge of the prescribed therapeutic regimen will improve Outcome: Progressing   Problem: Activity: Goal: Ability to tolerate increased activity will improve Outcome: Progressing   Problem: Health Behavior/Discharge Planning: Goal: Identification of resources available to assist in meeting health care needs will improve Outcome: Progressing   Problem: Nutrition: Goal: Maintenance of adequate nutrition will improve Outcome: Progressing   Problem: Clinical Measurements: Goal: Complications related to the disease process, condition or treatment will be avoided or minimized Outcome: Progressing   Problem: Skin Integrity: Goal: Demonstration of wound healing without infection will improve Outcome: Progressing

## 2018-12-27 NOTE — Plan of Care (Signed)
  Problem: Activity: Goal: Ability to tolerate increased activity will improve Outcome: Progressing   Problem: Activity: Goal: Ability to tolerate increased activity will improve Outcome: Progressing   Problem: Clinical Measurements: Goal: Complications related to the disease process, condition or treatment will be avoided or minimized Outcome: Progressing   Problem: Skin Integrity: Goal: Demonstration of wound healing without infection will improve Outcome: Progressing   Problem: Clinical Measurements: Goal: Complications related to the disease process, condition or treatment will be avoided or minimized Outcome: Progressing   Problem: Clinical Measurements: Goal: Complications related to the disease process, condition or treatment will be avoided or minimized Outcome: Progressing

## 2018-12-27 NOTE — Progress Notes (Signed)
Nutrition Follow-up  DOCUMENTATION CODES:   Non-severe (moderate) malnutrition in context of chronic illness, Underweight  INTERVENTION:  Provide Ensure Enlive po TID, each supplement provides 350 kcal and 20 grams of protein.  Encourage adequate PO intake.   NUTRITION DIAGNOSIS:   Moderate Malnutrition related to chronic illness(cirrhosis) as evidenced by moderate fat depletion, moderate muscle depletion; diet advanced; improving  GOAL:   Patient will meet greater than or equal to 90% of their needs; progressing  MONITOR:   PO intake, Supplement acceptance, Skin, Weight trends, Labs, I & O's  REASON FOR ASSESSMENT:   Other (Comment)(low BMI)    ASSESSMENT:   58 year old female who presented to the ED on 10/8 for evaluation after a fall. PMH of autoimmune hepatitis, cirrhosis, esophageal varices, HLD. CT scan showing SDH.  10/8 - s/p craniotomy and evacuation of SDH 10/9 - extubated   Meal completion has been 75-100%. Pt reports having a fair appetite currently and PTA with usual consumption of at least 2 meals a day. RD to order nutritional supplements to aid in caloric and protein needs as well as in healing. Labs and medications reviewed.   NUTRITION - FOCUSED PHYSICAL EXAM:    Most Recent Value  Orbital Region  Unable to assess  Upper Arm Region  Moderate depletion  Thoracic and Lumbar Region  Unable to assess  Buccal Region  Unable to assess  Temple Region  Unable to assess  Clavicle Bone Region  Mild depletion  Clavicle and Acromion Bone Region  Mild depletion  Scapular Bone Region  Unable to assess  Dorsal Hand  Unable to assess  Patellar Region  Moderate depletion  Anterior Thigh Region  Moderate depletion  Posterior Calf Region  Moderate depletion  Edema (RD Assessment)  Mild  Hair  Reviewed  Eyes  Reviewed  Mouth  Reviewed  Skin  Reviewed  Nails  Reviewed       Diet Order:   Diet Order            Diet regular Room service appropriate? Yes  with Assist; Fluid consistency: Thin  Diet effective now              EDUCATION NEEDS:   Not appropriate for education at this time  Skin:  Skin Assessment: Skin Integrity Issues: Skin Integrity Issues:: Incisions Incisions: L head  Last BM:  Unknown  Height:   Ht Readings from Last 1 Encounters:  12/22/18 (S) 5\' 3"  (1.6 m)    Weight:   Wt Readings from Last 1 Encounters:  12/26/18 45.8 kg    Ideal Body Weight:  52.3 kg  BMI:  Body mass index is 17.89 kg/m.  Estimated Nutritional Needs:   Kcal:  1500-1700  Protein:  70-85 grams  Fluid:  >/= 1.5 L/day    Corrin Parker, MS, RD, LDN Pager # [phone] After hours/ weekend pager # [phone]

## 2018-12-27 NOTE — Progress Notes (Addendum)
Subjective: Patient reports "I've walked around the corridor" "My face still hurts"  Objective: Vital signs in last 24 hours: Temp:  [98.3 F (36.8 C)-99.7 F (37.6 C)] 98.3 F (36.8 C) (10/13 1245) Pulse Rate:  [74-82] 74 (10/13 1245) Resp:  [18-26] 20 (10/13 1245) BP: (130-166)/(80-96) 157/87 (10/13 1245) SpO2:  [99 %-100 %] 100 % (10/13 1245)  Intake/Output from previous day: 10/12 0701 - 10/13 0700 In: 2164.4 [P.O.:600; I.V.:1564.4] Out: -  Intake/Output this shift: No intake/output data recorded.  Alert, smiling, conversant with fluent speech. MAEW. PEARL. No drift. Improved mobility per pt & Therapies. Incision with staples. No drainage, erythema or swelling. Facial bruising evolving. Pt understands facial pain will improve as muscle heals.  Lab Results: Recent Labs    12/25/18 0522 12/26/18 0508  WBC 5.3 4.8  HGB 9.4* 9.3*  HCT 26.7* 26.1*  PLT 85* 94*   BMET Recent Labs    12/25/18 0522 12/26/18 0508  NA 132* 134*  K 3.1* 3.3*  CL 99 105  CO2 22 20*  GLUCOSE 101* 100*  BUN 6 7  CREATININE 0.46 0.41*  CALCIUM 8.2* 8.4*    Studies/Results: No results found.  Assessment/Plan: improving  LOS: 5 days  Continue to mobilize today. Possible d/c to home tomorrow with HHPT/OT.   Verdis Prime 12/27/2018, 12:49 PM  Patient is making excellent progress.

## 2018-12-28 NOTE — Discharge Summary (Signed)
Physician Discharge Summary  Patient ID: Michelle Schneider MRN: [id number] DOB/AGE: February 16, 1961 58 y.o.  Admit date: 12/22/2018 Discharge date: 12/28/2018  Admission Diagnoses: subdural hematoma left with coagulopathy and cirrhosis    Discharge Diagnoses: subdural hematoma left with coagulopathy and cirrhosis s/p CRANIOTOMY HEMATOMA EVACUATION SUBDURAL (Left)     Active Problems:   Subdural hematoma (HCC)   SDH (subdural hematoma) (HCC)   Subdural bleeding [facility])   Discharged Condition: good  Hospital Course: Michelle Schneider was admitted through the ED for emergent surgery for evacuation of subdural hematoma. Following uncomplicated craniotomy, she recovered well and progressed steadily. Speech has improved. Gait has improved.   Consults: None  Significant Diagnostic Studies:   Treatments: surgery: CRANIOTOMY HEMATOMA EVACUATION SUBDURAL (Left)    Discharge Exam: Blood pressure (!) 151/88, pulse 72, temperature 98.5 F (36.9 C), temperature source Oral, resp. rate 16, height (S) 5\' 3"  (1.6 m), weight 45.8 kg, SpO2 100 %. Awakens to voice. Conversant. Reports only left side facial and scalp pain, improving. Incision with staples. No erythema, swelling, or drainage. MAEW. Improving gait.    Disposition:   Discharge to home with HHPT. Office f/u in 2 weeks for staple removal. Pt may shower. No lotions creams or ointments to incision. Ok to gently cleanse incision with shower.     Allergies as of 12/28/2018      Reactions   Oxycodone-acetaminophen Nausea And Vomiting, Other (See Comments)   Hallucinations and bad dreams      Medication List    TAKE these medications   magnesium gluconate 500 MG tablet Commonly known as: MAGONATE Take 500 mg by mouth daily as needed (for muscle spasms).   NON FORMULARY Place 2 drops under the tongue daily. CBD Oil Doterra   OVER THE COUNTER MEDICATION Take 3 tablets by mouth 2 (two) times daily. Doterra  Lifelong Vitality Pack   OVER THE COUNTER MEDICATION Take 1 tablet by mouth 2 (two) times daily. Bone Nutrient Supplement            Durable Medical Equipment  (From admission, onward)         Start     Ordered   12/27/18 1253  For home use only DME Walker rolling  Once    Question:  Patient needs a walker to treat with the following condition  Answer:  Gait abnormality   12/27/18 1254           Signed: Peggyann Shoals, MD 12/28/2018, 8:07 AM

## 2018-12-28 NOTE — TOC Transition Note (Signed)
Transition of Care [facility]) - CM/SW Discharge Note Marvetta Gibbons RN,BSN Transitions of Care Unit 4NP coverage - RN Case Manager [phone]   Patient Details  Name: Michelle Schneider MRN: [id number] Date of Birth: Feb 19, 1961  Transition of Care [facility]) CM/SW Contact:  Dawayne Patricia, RN Phone Number: 12/28/2018, 11:03 AM   Clinical Narrative:    Pt stable for transition home today s/p Crani. Orders placed for [facility] and DME needs. CM spoke with pt at bedside regarding recommendations for [facility] and DME- pt reports she has a RW available at home and declines needing one for herself- she is refusing DME at this time. Discussed HHPT/OT orders and recommendation- pt is agreeable to let [facility] services come out. Discussed charity program with Alvis Lemmings which pt is agreeable to referral. Call made to [facility] with Alvis Lemmings for [facility] referral- referral accepted.  Pt reports she has transportation and no further assistance needed. Pt states she does not have a PCP primarily sees a Dealer at [employer]. Address and phone numbers confirmed in [place].    Final next level of care: [place] Barriers to Discharge: No Barriers Identified   Patient Goals and CMS Choice Patient states their goals for this hospitalization and ongoing recovery are:: to return home CMS Medicare.gov Compare Post Acute Care list provided to:: Patient Choice offered to / list presented to : Patient  Discharge Placement  Home with [facility]                     Discharge Plan and Services   Discharge Planning Services: CM Consult Post Acute Care Choice: Durable Medical Equipment, Home Health          DME Arranged: Walker rolling DME Agency: [employer](pt has declined RW) Date DME Agency Contacted: 12/28/18 Time DME Agency Contacted: 46 Representative spoke with at DME Agency: Thedore Mins HH Arranged: OT, PT [place] Agency: [place] Date [place]: 12/28/18 Time [place]:  1000 Representative spoke with at [place]: [place] ([place]) Interventions     Readmission Risk Interventions Readmission Risk Prevention Plan 12/28/2018  Post Dischage Appt Complete  Medication Screening Complete  Transportation Screening Complete  Some recent data might be hidden

## 2018-12-28 NOTE — Progress Notes (Signed)
Physical Therapy Treatment Patient Details Name: Michelle Schneider MRN: [id number] DOB: 27-Dec-1960 Today's Date: 12/28/2018    History of Present Illness Pt is a 58 y/o female admitted after falling down the steps and is now s/p craniotomy subdural hematoma evacuation. PMH including but not limited to Raynauds, HLD, Autoimmune hepatitis with cirrhosis.    PT Comments    Continuing work on functional mobility and activity tolerance;  Showing very good progress with gait, balance and mobility; Stair training and progressive ambulation done; OK for dc home from PT standpoint    Follow Up Recommendations  Home health PT     Equipment Recommendations  None recommended by PT    Recommendations for Other Services       Precautions / Restrictions Precautions Precautions: Fall    Mobility  Bed Mobility Overal bed mobility: Modified Independent                Transfers Overall transfer level: Modified independent Equipment used: None                Ambulation/Gait Ambulation/Gait assistance: Supervision Gait Distance (Feet): 500 Feet Assistive device: None Gait Pattern/deviations: Step-through pattern;Decreased stride length     General Gait Details: Cues to self-monitor for activity tolerance; no loss of balance   Stairs Stairs: Yes Stairs assistance: Supervision Stair Management: One rail Right;Forwards;Alternating pattern Number of Stairs: 12 General stair comments: No difficulty   Wheelchair Mobility    Modified Rankin (Stroke Patients Only)       Balance                                            Cognition Arousal/Alertness: Awake/alert Behavior During Therapy: WFL for tasks assessed/performed Overall Cognitive Status: Within Functional Limits for tasks assessed(for simple mobiltiy tasks)                                        Exercises      General Comments        Pertinent Vitals/Pain Pain  Assessment: No/denies pain Pain Intervention(s): Monitored during session    Home Living                      Prior Function            PT Goals (current goals can now be found in the care plan section) Acute Rehab PT Goals Patient Stated Goal: return to independence PT Goal Formulation: With patient Time For Goal Achievement: 01/06/19 Potential to Achieve Goals: Good Progress towards PT goals: Progressing toward goals    Frequency    Min 3X/week      PT Plan Current plan remains appropriate    Co-evaluation              AM-PAC PT "6 Clicks" Mobility   Outcome Measure  Help needed turning from your back to your side while in a flat bed without using bedrails?: None Help needed moving from lying on your back to sitting on the side of a flat bed without using bedrails?: None Help needed moving to and from a bed to a chair (including a wheelchair)?: None Help needed standing up from a chair using your arms (e.g., wheelchair or bedside chair)?: None Help needed to walk in hospital  room?: None Help needed climbing 3-5 steps with a railing? : A Little 6 Click Score: 23    End of Session Equipment Utilized During Treatment: Gait belt Activity Tolerance: Patient tolerated treatment well Patient left: in chair;with call bell/phone within reach;with chair alarm set Nurse Communication: Mobility status PT Visit Diagnosis: Other abnormalities of gait and mobility (R26.89)     Time: 7209-4709 PT Time Calculation (min) (ACUTE ONLY): 14 min  Charges:  $Gait Training: 8-22 mins                     Roney Marion, PT  [place] Pager [phone] Office [phone]    Colletta [place] 12/28/2018, 3:18 PM

## 2018-12-28 NOTE — Progress Notes (Addendum)
Subjective: Patient reports "I'm doing ok, just up to the bathroom through the night"  Objective: Vital signs in last 24 hours: Temp:  [98.3 F (36.8 C)-99.7 F (37.6 C)] 98.5 F (36.9 C) (10/14 0322) Pulse Rate:  [72-82] 72 (10/14 0322) Resp:  [16-21] 16 (10/14 0322) BP: (129-166)/(87-96) 151/88 (10/14 0322) SpO2:  [99 %-100 %] 100 % (10/14 0322)  Intake/Output from previous day: 10/13 0701 - 10/14 0700 In: 1872.4 [P.O.:480; I.V.:1392.4] Out: 250 [Urine:250] Intake/Output this shift: No intake/output data recorded.  Awakens to voice. Conversant. Reports only left side facial and scalp pain, improving. Incision with staples. No erythema, swelling, or drainage. MAEW. Improving gait.   Lab Results: Recent Labs    12/26/18 0508  WBC 4.8  HGB 9.3*  HCT 26.1*  PLT 94*   BMET Recent Labs    12/26/18 0508  NA 134*  K 3.3*  CL 105  CO2 20*  GLUCOSE 100*  BUN 7  CREATININE 0.41*  CALCIUM 8.4*    Studies/Results: No results found.  Assessment/Plan: IMproving  LOS: 6 days  Per Dr. Vertell Limber, d/c to home with HHPT. Office f/u in 2 weeks for staple removal. Pt may shower. No lotions creams or ointments to incision. Ok to gently cleanse incision with shower.    Verdis Prime 12/28/2018, 7:50 AM   Patient is doing well.  Discharge home.

## 2018-12-28 NOTE — Discharge Instructions (Signed)
Office follow up in 2 weeks for staple removal. She may shower. No lotions, creams  or ointments to incision. Ok to gently cleanse incision with shower.

## 2018-12-28 NOTE — Progress Notes (Signed)
Occupational Therapy Treatment Patient Details Name: Michelle Schneider MRN: [id number] DOB: Aug 02, 1960 Today's Date: 12/28/2018    History of present illness Pt is a 58 y/o female admitted after falling down the steps and is now s/p craniotomy subdural hematoma evacuation. PMH including but not limited to Raynauds, HLD, Autoimmune hepatitis with cirrhosis.   OT comments  Pt progressing towards OT goals. Pt presents with decreased balance, processing, and increased pain. Pt participated in Pill Box Test, an assessment that challenges all four factors of executive functioning, including purposive/action/sefl-regulation, planning/attention, volition/inhibition, and effective performance/self-monitoring. The assessment is to be completed in less than 5 minutes and with fewer than 3 errors. During the assessment, pt presented with slow processing and increased time to read labels, however demonstrated good problem solving in checking each label multiple times to correctly follow all instructions. Pt completed the assessment with no errors, however required 6:31 minutes to complete the test. Update dc recommendation to dc home with no OT follow up and 24 hour supervision due to improvement in cognition and safety awareness. Will follow acutely as admitted.     Follow Up Recommendations  No OT follow up;Supervision/Assistance - 24 hour    Equipment Recommendations  None recommended by OT    Recommendations for Other Services PT consult    Precautions / Restrictions Precautions Precautions: Fall Restrictions Weight Bearing Restrictions: No       Mobility Bed Mobility Overal bed mobility: Modified Independent Bed Mobility: Supine to Sit     Supine to sit: Modified independent (Device/Increase time)     General bed mobility comments: mod I for increased time  Transfers Overall transfer level: Needs assistance Equipment used: None Transfers: Sit to/from Stand Sit to Stand:  Supervision         General transfer comment: supervision for safety    Balance Overall balance assessment: Mild deficits observed, not formally tested                                         ADL either performed or assessed with clinical judgement   ADL Overall ADL's : Needs assistance/impaired                         Toilet Transfer: Supervision/safety;Ambulation Toilet Transfer Details (indicate cue type and reason): simualted to recliner, supervision for safety         Functional mobility during ADLs: Supervision/safety General ADL Comments: Pt required supervision for safety for functional transfers     Vision       Perception     Praxis      Cognition Arousal/Alertness: Awake/alert Behavior During Therapy: WFL for tasks assessed/performed Overall Cognitive Status: Impaired/Different from baseline Area of Impairment: Problem solving;Attention                   Current Attention Level: Alternating   Following Commands: (Simultaneous filing. User may not have seen previous data.)   Awareness: (Simultaneous filing. User may not have seen previous data.) Problem Solving: Slow processing General Comments: Pt particpated in Pill Box Test, which is supposed to be completed in less than 5 minutes and with fewer than 3 errors. During the assessment, Pt demonstrated good problem solving skills, and would re-check each label to make sure she followed all instructions correctly. Pt was able to complete test correctly however required extra time, completing test in  6:31 minutes. Pt verbalized awareness of needing extra time, reporting that is was difficult to read the labels without her reading glasses.  Pt reports that at home, she organizes all of her brothers medications. Pt reports she feels comfortable completing this task successfully.         Exercises     Shoulder Instructions       General Comments Provided education on  methods for reducing headaches by limiting sensory input    Pertinent Vitals/ Pain       Pain Assessment: 0-10 Pain Score: 5  Pain Location: L side of head and inner ear Pain Descriptors / Indicators: Aching;Throbbing Pain Intervention(s): Monitored during session;Repositioned  Home Living                                          Prior Functioning/Environment              Frequency  Min 2X/week        Progress Toward Goals  OT Goals(current goals can now be found in the care plan section)  Progress towards OT goals: Progressing toward goals  Acute Rehab OT Goals Patient Stated Goal: return to independence OT Goal Formulation: With patient Time For Goal Achievement: 01/06/19 Potential to Achieve Goals: Good ADL Goals Additional ADL Goal #1: Pt will complete trail making test with <3 errors for successful completion Additional ADL Goal #2: Pt will sequence through BADL task of choosing with <3 VC's for successful completion Additional ADL Goal #3: Pt will complete dynamic balance task with supervision assist without LOB or safety concerns  Plan Discharge plan needs to be updated;Frequency remains appropriate    Co-evaluation                 AM-PAC OT "6 Clicks" Daily Activity     Outcome Measure   Help from another person eating meals?: None Help from another person taking care of personal grooming?: None Help from another person toileting, which includes using toliet, bedpan, or urinal?: None Help from another person bathing (including washing, rinsing, drying)?: None Help from another person to put on and taking off regular upper body clothing?: None Help from another person to put on and taking off regular lower body clothing?: None 6 Click Score: 24    End of Session    OT Visit Diagnosis: Unsteadiness on feet (R26.81);Other abnormalities of gait and mobility (R26.89);Muscle weakness (generalized) (M62.81);Other symptoms and signs  involving cognitive function;Pain Pain - part of body: (head and L ear)   Activity Tolerance Patient tolerated treatment well   Patient Left in chair;with call bell/phone within reach;with chair alarm set   Nurse Communication Mobility status;Other (comment)        Time: 7096-2836 OT Time Calculation (min): 29 min  Charges: OT General Charges $OT Visit: 1 Visit OT Treatments $Cognitive Funtion inital: Initial 15 mins $Cognitive Funtion additional: Additional15 mins  Gus Rankin, OT Student  Gus Rankin 12/28/2018, 10:47 AM

## 2018-12-28 NOTE — Progress Notes (Signed)
Pt with discharge orders. Discharge paperwork reviewed with pt and all questions answered. IV removed. No new prescriptions. Pt escorted out via wheelchair with all belongings.

## 2019-01-02 ENCOUNTER — Other Ambulatory Visit (HOSPITAL_COMMUNITY): Payer: Self-pay | Admitting: Neurosurgery

## 2019-01-02 ENCOUNTER — Other Ambulatory Visit: Payer: Self-pay | Admitting: Neurosurgery

## 2019-01-02 DIAGNOSIS — S065X0S Traumatic subdural hemorrhage without loss of consciousness, sequela: Secondary | ICD-10-CM

## 2019-01-09 ENCOUNTER — Other Ambulatory Visit: Payer: Self-pay

## 2019-01-09 ENCOUNTER — Ambulatory Visit (HOSPITAL_COMMUNITY)
Admission: RE | Admit: 2019-01-09 | Discharge: 2019-01-09 | Disposition: A | Discharge status: Home / Self Care | Payer: PRIVATE HEALTH INSURANCE | Source: Ambulatory Visit | Attending: Neurosurgery | Admitting: Neurosurgery

## 2019-01-09 DIAGNOSIS — S065X0S Traumatic subdural hemorrhage without loss of consciousness, sequela: Secondary | ICD-10-CM | POA: Insufficient documentation

## 2019-01-18 ENCOUNTER — Other Ambulatory Visit: Payer: Self-pay | Admitting: Neurosurgery

## 2019-01-18 DIAGNOSIS — S065X0S Traumatic subdural hemorrhage without loss of consciousness, sequela: Secondary | ICD-10-CM

## 2019-02-06 ENCOUNTER — Ambulatory Visit
Admission: RE | Admit: 2019-02-06 | Discharge: 2019-02-06 | Disposition: A | Discharge status: Home / Self Care | Payer: Self-pay | Source: Ambulatory Visit | Attending: Neurosurgery | Admitting: Neurosurgery

## 2019-02-06 ENCOUNTER — Other Ambulatory Visit: Payer: Self-pay

## 2019-02-06 DIAGNOSIS — S065X0S Traumatic subdural hemorrhage without loss of consciousness, sequela: Secondary | ICD-10-CM

## 2020-03-09 ENCOUNTER — Inpatient Hospital Stay (HOSPITAL_COMMUNITY)
Admission: EM | Admit: 2020-03-09 | Discharge: 2020-03-14 | DRG: 441 | Disposition: A | Discharge status: Home Health | Payer: No Typology Code available for payment source | Attending: Internal Medicine | Admitting: Internal Medicine

## 2020-03-09 ENCOUNTER — Inpatient Hospital Stay (HOSPITAL_COMMUNITY): Payer: No Typology Code available for payment source

## 2020-03-09 ENCOUNTER — Other Ambulatory Visit: Payer: Self-pay

## 2020-03-09 ENCOUNTER — Encounter (HOSPITAL_COMMUNITY): Payer: Self-pay

## 2020-03-09 DIAGNOSIS — E872 Acidosis: Secondary | ICD-10-CM | POA: Diagnosis present

## 2020-03-09 DIAGNOSIS — R791 Abnormal coagulation profile: Secondary | ICD-10-CM | POA: Diagnosis not present

## 2020-03-09 DIAGNOSIS — Z681 Body mass index (BMI) 19 or less, adult: Secondary | ICD-10-CM | POA: Diagnosis not present

## 2020-03-09 DIAGNOSIS — Z20822 Contact with and (suspected) exposure to covid-19: Secondary | ICD-10-CM | POA: Diagnosis present

## 2020-03-09 DIAGNOSIS — D689 Coagulation defect, unspecified: Secondary | ICD-10-CM | POA: Diagnosis present

## 2020-03-09 DIAGNOSIS — E785 Hyperlipidemia, unspecified: Secondary | ICD-10-CM | POA: Diagnosis present

## 2020-03-09 DIAGNOSIS — R579 Shock, unspecified: Secondary | ICD-10-CM

## 2020-03-09 DIAGNOSIS — K920 Hematemesis: Secondary | ICD-10-CM | POA: Diagnosis not present

## 2020-03-09 DIAGNOSIS — R160 Hepatomegaly, not elsewhere classified: Secondary | ICD-10-CM

## 2020-03-09 DIAGNOSIS — K7469 Other cirrhosis of liver: Secondary | ICD-10-CM | POA: Diagnosis not present

## 2020-03-09 DIAGNOSIS — M6284 Sarcopenia: Secondary | ICD-10-CM | POA: Diagnosis present

## 2020-03-09 DIAGNOSIS — E869 Volume depletion, unspecified: Secondary | ICD-10-CM | POA: Diagnosis present

## 2020-03-09 DIAGNOSIS — K754 Autoimmune hepatitis: Secondary | ICD-10-CM | POA: Diagnosis present

## 2020-03-09 DIAGNOSIS — K7030 Alcoholic cirrhosis of liver without ascites: Secondary | ICD-10-CM | POA: Diagnosis present

## 2020-03-09 DIAGNOSIS — K449 Diaphragmatic hernia without obstruction or gangrene: Secondary | ICD-10-CM | POA: Diagnosis present

## 2020-03-09 DIAGNOSIS — I361 Nonrheumatic tricuspid (valve) insufficiency: Secondary | ICD-10-CM | POA: Diagnosis not present

## 2020-03-09 DIAGNOSIS — K922 Gastrointestinal hemorrhage, unspecified: Secondary | ICD-10-CM | POA: Diagnosis not present

## 2020-03-09 DIAGNOSIS — K7460 Unspecified cirrhosis of liver: Secondary | ICD-10-CM | POA: Diagnosis not present

## 2020-03-09 DIAGNOSIS — Z8719 Personal history of other diseases of the digestive system: Secondary | ICD-10-CM

## 2020-03-09 DIAGNOSIS — I8510 Secondary esophageal varices without bleeding: Secondary | ICD-10-CM | POA: Diagnosis present

## 2020-03-09 DIAGNOSIS — I5032 Chronic diastolic (congestive) heart failure: Secondary | ICD-10-CM | POA: Diagnosis present

## 2020-03-09 DIAGNOSIS — K7040 Alcoholic hepatic failure without coma: Secondary | ICD-10-CM | POA: Diagnosis present

## 2020-03-09 DIAGNOSIS — E876 Hypokalemia: Secondary | ICD-10-CM | POA: Diagnosis present

## 2020-03-09 DIAGNOSIS — R578 Other shock: Secondary | ICD-10-CM | POA: Diagnosis present

## 2020-03-09 DIAGNOSIS — D62 Acute posthemorrhagic anemia: Secondary | ICD-10-CM | POA: Diagnosis present

## 2020-03-09 DIAGNOSIS — R011 Cardiac murmur, unspecified: Secondary | ICD-10-CM | POA: Diagnosis not present

## 2020-03-09 DIAGNOSIS — D696 Thrombocytopenia, unspecified: Secondary | ICD-10-CM | POA: Diagnosis not present

## 2020-03-09 DIAGNOSIS — I8511 Secondary esophageal varices with bleeding: Secondary | ICD-10-CM | POA: Diagnosis present

## 2020-03-09 DIAGNOSIS — E43 Unspecified severe protein-calorie malnutrition: Secondary | ICD-10-CM | POA: Diagnosis present

## 2020-03-09 DIAGNOSIS — K5900 Constipation, unspecified: Secondary | ICD-10-CM | POA: Diagnosis present

## 2020-03-09 DIAGNOSIS — Z90710 Acquired absence of both cervix and uterus: Secondary | ICD-10-CM

## 2020-03-09 DIAGNOSIS — Z79899 Other long term (current) drug therapy: Secondary | ICD-10-CM

## 2020-03-09 DIAGNOSIS — R188 Other ascites: Secondary | ICD-10-CM | POA: Diagnosis present

## 2020-03-09 DIAGNOSIS — I7300 Raynaud's syndrome without gangrene: Secondary | ICD-10-CM | POA: Diagnosis present

## 2020-03-09 DIAGNOSIS — D6959 Other secondary thrombocytopenia: Secondary | ICD-10-CM | POA: Diagnosis present

## 2020-03-09 DIAGNOSIS — E440 Moderate protein-calorie malnutrition: Secondary | ICD-10-CM | POA: Insufficient documentation

## 2020-03-09 DIAGNOSIS — L659 Nonscarring hair loss, unspecified: Secondary | ICD-10-CM | POA: Diagnosis present

## 2020-03-09 DIAGNOSIS — K567 Ileus, unspecified: Secondary | ICD-10-CM

## 2020-03-09 LAB — CBC WITH DIFFERENTIAL/PLATELET
Abs Immature Granulocytes: 0.03 10*3/uL (ref 0.00–0.07)
Basophils Absolute: 0 10*3/uL (ref 0.0–0.1)
Basophils Relative: 1 %
Eosinophils Absolute: 0.1 10*3/uL (ref 0.0–0.5)
Eosinophils Relative: 2 %
HCT: 24.5 % — ABNORMAL LOW (ref 36.0–46.0)
Hemoglobin: 8.1 g/dL — ABNORMAL LOW (ref 12.0–15.0)
Immature Granulocytes: 1 %
Lymphocytes Relative: 14 %
Lymphs Abs: 0.6 10*3/uL — ABNORMAL LOW (ref 0.7–4.0)
MCH: 34.6 pg — ABNORMAL HIGH (ref 26.0–34.0)
MCHC: 33.1 g/dL (ref 30.0–36.0)
MCV: 104.7 fL — ABNORMAL HIGH (ref 80.0–100.0)
Monocytes Absolute: 0.5 10*3/uL (ref 0.1–1.0)
Monocytes Relative: 11 %
Neutro Abs: 3.1 10*3/uL (ref 1.7–7.7)
Neutrophils Relative %: 71 %
Platelets: 19 10*3/uL — CL (ref 150–400)
RBC: 2.34 MIL/uL — ABNORMAL LOW (ref 3.87–5.11)
RDW: 14.5 % (ref 11.5–15.5)
WBC: 4.4 10*3/uL (ref 4.0–10.5)
nRBC: 0 % (ref 0.0–0.2)

## 2020-03-09 LAB — COMPREHENSIVE METABOLIC PANEL
ALT: 49 U/L — ABNORMAL HIGH (ref 0–44)
AST: 148 U/L — ABNORMAL HIGH (ref 15–41)
Albumin: 3.1 g/dL — ABNORMAL LOW (ref 3.5–5.0)
Alkaline Phosphatase: 52 U/L (ref 38–126)
Anion gap: 20 — ABNORMAL HIGH (ref 5–15)
BUN: 19 mg/dL (ref 8–23)
CO2: 20 mmol/L — ABNORMAL LOW (ref 22–32)
Calcium: 8.9 mg/dL (ref 8.9–10.3)
Chloride: 95 mmol/L — ABNORMAL LOW (ref 98–111)
Creatinine, Ser: 0.77 mg/dL (ref 0.44–1.00)
GFR, Estimated: >60 mL/min (ref >60)
Glucose, Bld: 108 mg/dL — ABNORMAL HIGH (ref 70–99)
Potassium: 3 mmol/L — ABNORMAL LOW (ref 3.5–5.1)
Sodium: 135 mmol/L (ref 135–145)
Total Bilirubin: 7.8 mg/dL — ABNORMAL HIGH (ref 0.3–1.2)
Total Protein: 6.6 g/dL (ref 6.5–8.1)

## 2020-03-09 LAB — CBC
HCT: 16.7 % — ABNORMAL LOW (ref 36.0–46.0)
Hemoglobin: 5.3 g/dL — CL (ref 12.0–15.0)
MCH: 34.6 pg — ABNORMAL HIGH (ref 26.0–34.0)
MCHC: 31.7 g/dL (ref 30.0–36.0)
MCV: 109.2 fL — ABNORMAL HIGH (ref 80.0–100.0)
Platelets: 95 10*3/uL — ABNORMAL LOW (ref 150–400)
RBC: 1.53 MIL/uL — ABNORMAL LOW (ref 3.87–5.11)
RDW: 14.7 % (ref 11.5–15.5)
WBC: 6 10*3/uL (ref 4.0–10.5)
nRBC: 0 % (ref 0.0–0.2)

## 2020-03-09 LAB — I-STAT CHEM 8, ED
BUN: 20 mg/dL (ref 8–23)
Calcium, Ion: 1.12 mmol/L — ABNORMAL LOW (ref 1.15–1.40)
Chloride: 95 mmol/L — ABNORMAL LOW (ref 98–111)
Creatinine, Ser: 0.3 mg/dL — ABNORMAL LOW (ref 0.44–1.00)
Glucose, Bld: 103 mg/dL — ABNORMAL HIGH (ref 70–99)
HCT: 26 % — ABNORMAL LOW (ref 36.0–46.0)
Hemoglobin: 8.8 g/dL — ABNORMAL LOW (ref 12.0–15.0)
Potassium: 3.1 mmol/L — ABNORMAL LOW (ref 3.5–5.1)
Sodium: 136 mmol/L (ref 135–145)
TCO2: 22 mmol/L (ref 22–32)

## 2020-03-09 LAB — LIPASE, BLOOD: Lipase: 96 U/L — ABNORMAL HIGH (ref 11–51)

## 2020-03-09 LAB — PROTIME-INR
INR: 1.9 — ABNORMAL HIGH (ref 0.8–1.2)
Prothrombin Time: 20.8 seconds — ABNORMAL HIGH (ref 11.4–15.2)

## 2020-03-09 LAB — PREPARE RBC (CROSSMATCH)

## 2020-03-09 LAB — RESP PANEL BY RT-PCR (FLU A&B, COVID) ARPGX2
Influenza A by PCR: NEGATIVE
Influenza B by PCR: NEGATIVE
SARS Coronavirus 2 by RT PCR: NEGATIVE

## 2020-03-09 LAB — MAGNESIUM: Magnesium: 1.7 mg/dL (ref 1.7–2.4)

## 2020-03-09 MED ORDER — POTASSIUM CHLORIDE 10 MEQ/100ML IV SOLN
10.0000 meq | INTRAVENOUS | Status: AC
  Administered 2020-03-09 – 2020-03-10 (×3): 10 meq via INTRAVENOUS
  Filled 2020-03-09 (×4): qty 100

## 2020-03-09 MED ORDER — SODIUM CHLORIDE 0.9 % IV SOLN
50.0000 ug/h | INTRAVENOUS | Status: AC
  Administered 2020-03-09 – 2020-03-12 (×7): 50 ug/h via INTRAVENOUS
  Filled 2020-03-09 (×9): qty 1

## 2020-03-09 MED ORDER — POTASSIUM CHLORIDE CRYS ER 20 MEQ PO TBCR
40.0000 meq | EXTENDED_RELEASE_TABLET | Freq: Once | ORAL | Status: DC
  Filled 2020-03-09: qty 2

## 2020-03-09 MED ORDER — MORPHINE SULFATE (PF) 2 MG/ML IV SOLN: 2.0000 mg | Freq: Once | INTRAVENOUS | Status: DC

## 2020-03-09 MED ORDER — SODIUM CHLORIDE 0.9 % IV SOLN
1.0000 g | Freq: Once | INTRAVENOUS | Status: AC
  Administered 2020-03-09: 1 g via INTRAVENOUS
  Filled 2020-03-09: qty 10

## 2020-03-09 MED ORDER — SODIUM CHLORIDE 0.9% IV SOLUTION: Freq: Once | INTRAVENOUS | Status: AC

## 2020-03-09 MED ORDER — SODIUM CHLORIDE 0.9 % IV SOLN: 250.0000 mL | INTRAVENOUS | Status: DC

## 2020-03-09 MED ORDER — ONDANSETRON HCL 4 MG/2ML IJ SOLN
4.0000 mg | Freq: Three times a day (TID) | INTRAMUSCULAR | Status: DC | PRN
  Administered 2020-03-10 – 2020-03-12 (×2): 4 mg via INTRAVENOUS
  Filled 2020-03-09 (×2): qty 2

## 2020-03-09 MED ORDER — SODIUM CHLORIDE 0.9 % IV SOLN
8.0000 mg/h | INTRAVENOUS | Status: AC
  Administered 2020-03-09 – 2020-03-12 (×7): 8 mg/h via INTRAVENOUS
  Filled 2020-03-09 (×7): qty 80

## 2020-03-09 MED ORDER — NOREPINEPHRINE 4 MG/250ML-% IV SOLN: 0.0000 ug/min | INTRAVENOUS | Status: DC

## 2020-03-09 MED ORDER — ONDANSETRON HCL 4 MG/2ML IJ SOLN
4.0000 mg | Freq: Once | INTRAMUSCULAR | Status: AC
  Administered 2020-03-09: 4 mg via INTRAVENOUS
  Filled 2020-03-09: qty 2

## 2020-03-09 MED ORDER — DOCUSATE SODIUM 100 MG PO CAPS: 100.0000 mg | ORAL_CAPSULE | Freq: Two times a day (BID) | ORAL | Status: DC | PRN

## 2020-03-09 MED ORDER — POLYETHYLENE GLYCOL 3350 17 G PO PACK
17.0000 g | PACK | Freq: Every day | ORAL | Status: DC | PRN
  Administered 2020-03-12 – 2020-03-13 (×2): 17 g via ORAL
  Filled 2020-03-09 (×2): qty 1

## 2020-03-09 MED ORDER — MORPHINE SULFATE (PF) 2 MG/ML IV SOLN
INTRAVENOUS | Status: AC
  Administered 2020-03-09: 2 mg via INTRAVENOUS
  Filled 2020-03-09: qty 1

## 2020-03-09 MED ORDER — NOREPINEPHRINE 4 MG/250ML-% IV SOLN
2.0000 ug/min | INTRAVENOUS | Status: DC
  Administered 2020-03-10: 2 ug/min via INTRAVENOUS
  Filled 2020-03-09: qty 250

## 2020-03-09 MED ORDER — OCTREOTIDE LOAD VIA INFUSION
50.0000 ug | Freq: Once | INTRAVENOUS | Status: AC
  Administered 2020-03-09: 50 ug via INTRAVENOUS
  Filled 2020-03-09: qty 25

## 2020-03-09 MED ORDER — SODIUM CHLORIDE 0.9 % IV BOLUS
1000.0000 mL | Freq: Once | INTRAVENOUS | Status: AC
  Administered 2020-03-09: 1000 mL via INTRAVENOUS

## 2020-03-09 MED ORDER — PANTOPRAZOLE SODIUM 40 MG IV SOLR
40.0000 mg | Freq: Once | INTRAVENOUS | Status: AC
  Administered 2020-03-09: 40 mg via INTRAVENOUS
  Filled 2020-03-09: qty 40

## 2020-03-09 MED ORDER — POTASSIUM CHLORIDE 10 MEQ/100ML IV SOLN: 10.0000 meq | INTRAVENOUS | Status: DC

## 2020-03-09 MED ORDER — PANTOPRAZOLE SODIUM 40 MG IV SOLR: 40.0000 mg | Freq: Two times a day (BID) | INTRAVENOUS | Status: DC

## 2020-03-09 MED ORDER — SODIUM CHLORIDE 0.9 % IV SOLN
1.0000 g | INTRAVENOUS | Status: AC
  Administered 2020-03-10 – 2020-03-13 (×4): 1 g via INTRAVENOUS
  Filled 2020-03-09: qty 1
  Filled 2020-03-09 (×3): qty 10

## 2020-03-09 MED ORDER — VITAMIN K1 10 MG/ML IJ SOLN
10.0000 mg | Freq: Once | INTRAVENOUS | Status: AC
  Administered 2020-03-09: 10 mg via INTRAVENOUS
  Filled 2020-03-09: qty 1

## 2020-03-09 NOTE — H&P (Addendum)
NAME:  Michelle Schneider, MRN:  [id number], DOB:  [birth year], LOS: 0 ADMISSION DATE:  03/09/2020, CONSULTATION DATE:  03/09/20 REFERRING MD:  Adela Lank, CHIEF COMPLAINT:  hematemesis  Brief History:  59 yo w hx of autoimmune hepatitis presents with hematemesis and melena.  History of Present Illness:  This is a 59 yo female with history of autoimmune hepatitis, cirrhosis and varices who presented with melena x4 to the ED. She has been having nausea and vomiting. Some of the vomit just had blood mixed in and other times it pure blood. She notes that she has had black tarry stools x 1 day as well. She has never had this before. No NSAID use. No history of Hpylori or ulcers. She has not been exposed to any Covid. No SOB or fevers. Is not vaccinated.   In ED she had one more bout of hematemesis. Was estimated by staff to be about 200 cc's and be Frankly bloody. In ED patient noted to have Hypotension that was persistent.  Additionally patient had labs that were notable for hemoglobin of 8.1 which is down from 9.3 previously platelet count of 19 and an INR of 1.9.  2 units of blood were ordered by the hospitalist team as well as platelets.  GI was consulted in the ED and recommended supportive therapy unless patient should decompensate abruptly and require intubation.   Of note patient with history of autoimmune hepatitis that was diagnosed in 2017 she had a positive ANA IgG and smooth muscle antibodies additionally she had a biopsy that was consistent with autoimmune hepatitis. she had been on prednisone at one point.  Was transitioned to azathioprine.  Patient did not tolerate the azathioprine.  She felt she was more fatigued with that and was losing hair.  She had self tapered off of prednisone and had discontinued Imuran. There is documentation that patient was using essential oils as well.   Patient tells me that she has not been on any immunosuppressive medications.  Not taking any prednisone.  Not  having taken any Imuran.  Not following up with any GI docs.  She has not had any recent endoscopies or colonoscopies.  She is not take any other medications for any other medical conditions at this time.   EGD 11/14/2015 - small varices [place] - 03/01/2017 - cirrhotic changes without mass lesions  Past Medical History:  Autoimmune hepatitis Cirrhosis varices   Significant Hospital Events:  Admitted to PCCM on 03-09-2020  Consults:  PCCM  Procedures:  Central line and A-line placement on 03-09-2020  Significant Diagnostic Tests:  Echocardiogram  Micro Data:  Cultures pending  Antimicrobials:  Received ceftriaxone in the ED for potential SBP  Interim History / Subjective:  Not applicable  Objective   Blood pressure (!) 83/55, pulse 96, temperature 98.3 F (36.8 C), resp. rate 15, height 5\' 2"  (1.575 m), weight 41.7 kg, SpO2 99 %.        Intake/Output Summary (Last 24 hours) at 03/09/2020 2354 Last data filed at 03/09/2020 2130 Gross per 24 hour  Intake 1648.03 ml  Output 200 ml  Net 1448.03 ml   Filed Weights   03/09/20 1714  Weight: 41.7 kg    Examination: General: Patient alert and oriented.  Pleasant to interact with.  Able to communicate in a meaningful way. HENT: There is significant pallor. Some scleral icterus.  Lungs: Clear to auscultation bilaterally.  No evidence of increased work of breathing. Cardiovascular: Regular rate and rhythm.systolic murmur appreciated Abdomen: Soft nontender  nondistended Extremities: No edema appreciated.  No contractures or deformities.  There is some evidence of muscle wasting Neuro: Grossly normal.  No focal deficits appreciated GU: Deferred  Resolved Hospital Problem list   Not applicable  Assessment & Plan:  This is a 59 year old female with history as noted above who presents for chief complaint of hematemesis.  On review of systems patient noted to have melena.   Hematemesis-most likely related to known  esophageal varices.  However we will also entertain the idea of ulcer.  Patient denies any NSAID use or alcohol use. -Octreotide -Protonix -CBC every every 6 hours -Blood cultures  -UA -Ceftriaxone daily for SBP prophylaxis -Continue to follow recommendations from GI as they have been consulted and made aware patient in the ED -Maintain patient n.p.o.  There are tentative plans for endoscopy in the a.m. with GI around 9:30 AM -In the morning can inquire further concerning steroids for autoimmune hepatitis. -If if nausea can use Zofran.  QTC checked and just around 480   Shock-likely hemorrhagic and distributive from liver failure -Patient with persistent hypotension in the setting of known cirrhosis -Obtain echo in a.m. -Levo as needed -Octreotide -Transfuse as needed patient already received 2 units of blood may need more we will continue to follow.  Maintain MAP of 65 and above -Central line and A-line placed for better monitoring and treatment of the above  Thrombocytopenia-likely allergic related to cirrhosis -Patient received platelets in the ED -Monitor platelets with daily CBC.  Transfuse if active bleeding   Best practice (evaluated daily)  Diet: N.p.o. for now Pain/Anxiety/Delirium protocol (if indicated): Not indicated VAP protocol (if indicated): Not indicated DVT prophylaxis: Held for now GI prophylaxis: Protonix as above Glucose control: evey 6 hours while NPO Mobility: Bedrest for now Disposition: ICU  Goals of Care:  Last date of multidisciplinary goals of care discussion: N/A Family and staff present: N/A Summary of discussion: N/A Follow up goals of care discussion due: N/A Code Status: Full code per patient  Labs   CBC: Recent Labs  Lab 03/09/20 1748 03/09/20 1802 03/09/20 2256  WBC 4.4  --  6.0  NEUTROABS 3.1  --   --   HGB 8.1* 8.8* 5.3*  HCT 24.5* 26.0* 16.7*  MCV 104.7*  --  109.2*  PLT 19*  --  95*    Basic Metabolic Panel: Recent Labs   Lab 03/09/20 1748 03/09/20 1802  NA 135 136  K 3.0* 3.1*  CL 95* 95*  CO2 20*  --   GLUCOSE 108* 103*  BUN 19 20  CREATININE 0.77 0.30*  CALCIUM 8.9  --   MG 1.7  --    GFR: Estimated Creatinine Clearance: 48.6 mL/min (A) (by C-G formula based on SCr of 0.3 mg/dL (L)). Recent Labs  Lab 03/09/20 1748 03/09/20 2256  WBC 4.4 6.0    Liver Function Tests: Recent Labs  Lab 03/09/20 1748  AST 148*  ALT 49*  ALKPHOS 52  BILITOT 7.8*  PROT 6.6  ALBUMIN 3.1*   Recent Labs  Lab 03/09/20 1748  LIPASE 96*   No results for input(s): AMMONIA in the last 168 hours.  ABG    Component Value Date/Time   PHART 7.452 (H) 12/22/2018 2318   PCO2ART 35.3 12/22/2018 2318   PO2ART 184.0 (H) 12/22/2018 2318   HCO3 24.6 12/22/2018 2318   TCO2 22 03/09/2020 1802   ACIDBASEDEF 1.0 12/22/2018 1937   O2SAT 100.0 12/22/2018 2318     Coagulation Profile: Recent Labs  Lab 03/09/20 1748  INR 1.9*    Cardiac Enzymes: No results for input(s): CKTOTAL, CKMB, CKMBINDEX, TROPONINI in the last 168 hours.  HbA1C: Hgb A1c MFr Bld  Date/Time Value Ref Range Status  03/10/2016 12:37 PM 5.2 4.6 - 6.5 % Final    Comment:    Glycemic Control Guidelines for People with Diabetes:Non Diabetic:  <6%Goal of Therapy: <7%Additional Action Suggested:  >8%     CBG: No results for input(s): GLUCAP in the last 168 hours.  Review of Systems:   Pertinent positive and negative per HPI the remainder of the 12 point RO was negative.   Past Medical History:  She,  has a past medical history of Autoimmune hepatitis (HCC), Cirrhosis (HCC), Esophageal varices (HCC), Family history of adverse reaction to anesthesia, Heart murmur, Hyperlipidemia, and Raynaud's disease.   Surgical History:   Past Surgical History:  Procedure Laterality Date  . ABDOMINAL HYSTERECTOMY    . CRANIOTOMY Left 12/22/2018   Procedure: CRANIOTOMY HEMATOMA EVACUATION SUBDURAL;  Surgeon: Maeola Harman, MD;  [facility];   Service: Neurosurgery;  Laterality: Left;  . ESOPHAGOGASTRODUODENOSCOPY (EGD) WITH PROPOFOL N/A 06/15/2017   Procedure: ESOPHAGOGASTRODUODENOSCOPY (EGD) WITH PROPOFOL;  Surgeon: Benancio Deeds, MD;  Location: [facility] ENDOSCOPY;  Service: Gastroenterology;  Laterality: N/A;  . ORIF HUMERUS FRACTURE Right 05/23/2015   Procedure: OPEN REDUCTION INTERNAL FIXATION (ORIF) RIGHT  PROXIMAL HUMERUS FRACTURE;  Surgeon: Francena Hanly, MD;  Location: [facility] OR;  Service: Orthopedics;  Laterality: Right;  . WISDOM TOOTH EXTRACTION       Social History:   reports that she has never smoked. She has never used smokeless tobacco. She reports current alcohol use. She reports that she does not use drugs.   Family History:  Her family history includes Dementia in her father; Parkinson's disease in her mother; Stroke in her brother. There is no history of Colon cancer, Esophageal cancer, Pancreatic cancer, Rectal cancer, or Stomach cancer.   Allergies Allergies  Allergen Reactions  . Oxycodone-Acetaminophen Nausea And Vomiting and Other (See Comments)    Hallucinations and bad dreams     Home Medications  Prior to Admission medications   Medication Sig Start Date End Date Taking? Authorizing Provider  magnesium gluconate (MAGONATE) 500 MG tablet Take 500 mg by mouth daily as needed (for muscle spasms).     [provider]  NON FORMULARY Place 2 drops under the tongue daily. CBD Oil Doterra    [provider]  OVER THE COUNTER MEDICATION Take 3 tablets by mouth 2 (two) times daily. Doterra Lifelong Vitality Pack    [provider]  OVER THE COUNTER MEDICATION Take 1 tablet by mouth 2 (two) times daily. Bone Nutrient Supplement    [provider]     Critical care time: 60 minutes

## 2020-03-09 NOTE — ED Notes (Signed)
[facility] service called to Dr. Adela Lank

## 2020-03-09 NOTE — Procedures (Signed)
Arterial Catheter Insertion Procedure Note  Michelle Schneider  [id number]  10-23-1960  Date:03/09/20  Time:11:03 PM    Provider Performing: Jolyn Nap    Procedure: Insertion of Arterial Line (85462) with US guidance (70350)   Indication(s) Blood pressure monitoring and/or need for frequent ABGs  Consent Risks of the procedure as well as the alternatives and risks of each were explained to the patient and/or caregiver.  Consent for the procedure was obtained and is signed in the bedside chart  Anesthesia None   Time Out Verified patient identification, verified procedure, site/side was marked, verified correct patient position, special equipment/implants available, medications/allergies/relevant history reviewed, required imaging and test results available.   Sterile Technique Maximal sterile technique including full sterile barrier drape, hand hygiene, sterile gown, sterile gloves, mask, hair covering, sterile ultrasound probe cover (if used).   Procedure Description Area of catheter insertion was cleaned with chlorhexidine and draped in sterile fashion. With real-time ultrasound guidance an arterial catheter was placed into the right radial artery.  Appropriate arterial tracings confirmed on monitor.     Complications/Tolerance None; patient tolerated the procedure well.   EBL Minimal   Specimen(s) None

## 2020-03-09 NOTE — Procedures (Signed)
Central Venous Catheter Insertion Procedure Note  MEGHANA TULLO  [id number]  02-07-1961  Date:03/09/20  Time:11:01 PM   Provider Performing:Treniyah Lynn Bea Laura Mikki Harbor   Procedure: Insertion of Non-tunneled Central Venous [phone]) with US guidance ([id number])   Indication(s) Medication administration and Difficult access  Consent Risks of the procedure as well as the alternatives and risks of each were explained to the patient and/or caregiver.  Consent for the procedure was obtained and is signed in the bedside chart  Anesthesia Topical only with 1% lidocaine   Timeout Verified patient identification, verified procedure, site/side was marked, verified correct patient position, special equipment/implants available, medications/allergies/relevant history reviewed, required imaging and test results available.  Sterile Technique Maximal sterile technique including full sterile barrier drape, hand hygiene, sterile gown, sterile gloves, mask, hair covering, sterile ultrasound probe cover (if used).  Procedure Description Area of catheter insertion was cleaned with chlorhexidine and draped in sterile fashion.  With real-time ultrasound guidance a central venous catheter was placed into the right internal jugular vein. Nonpulsatile blood flow and easy flushing noted in all ports.  The catheter was sutured in place and sterile dressing applied.  Complications/Tolerance None; patient tolerated the procedure well. Chest X-ray is ordered to verify placement for internal jugular or subclavian cannulation.   Chest x-ray is not ordered for femoral cannulation.  EBL Minimal  Specimen(s) None

## 2020-03-09 NOTE — H&P (Deleted)
NAME:  Michelle Schneider, MRN:  [id number], DOB:  28-Jul-1960, LOS: 0 ADMISSION DATE:  03/09/2020, CONSULTATION DATE:  03/09/20 REFERRING MD:  Adela Lank, CHIEF COMPLAINT:  hematemesis  Brief History:  59 yo w hx of autoimmune hepatitis presents with hematemesis and melena.  History of Present Illness:  This is a 59 yo female with history of autoimmune hepatitis who presented with melena x4 to the ED. She has been having nausea and vomiting. Some of the vomit just had blood mixed in and other times it pure blood. She notes that she has had black tarry stools x 1 day as well. She has never had this before. No NSAID use. No history of Hpylori or ulcers. She has not been exposed to any Covid. No SOB or fevers. Is not vaccinated.   In ED she had one more bout of hematemesis. Was estimated by staff to be about 200 cc's and be Frankly bloody. In ED patient noted to have Hypotension that was persistent.  Additionally patient had labs that were notable for hemoglobin of 8.1 which is down from 9.3 previously platelet count of 19 and an INR of 1.9.  2 units of blood were ordered by the hospitalist team as well as platelets.  GI was consulted in the ED and recommended supportive therapy unless patient should decompensate abruptly and require intubation.   Of note patient with history of autoimmune hepatitis that was diagnosed in 2017 she had a positive ANA IgG and smooth muscle antibodies additionally she had a biopsy that was consistent with autoimmune hepatitis. she had been on prednisone at one point.  Was transitioned to azathioprine.  Patient did not tolerate the azathioprine.  She felt she was more fatigued with that and was losing hair.  She had self tapered off of prednisone and had discontinued Imuran. There is documentation that patient was using essential oils as well.   Patient tells me that she has not been on any immunosuppressive medications.  Not taking any prednisone.  Not having taken any Imuran.   Not following up with any GI docs.  She has not had any recent endoscopies or colonoscopies.  She is not take any other medications for any other medical conditions at this time.  Past Medical History:  Autoimmune hepatitis  Significant Hospital Events:  Admitted to PCCM on 03-09-2020  Consults:  PCCM  Procedures:  Central line and A-line placement on 03-09-2020  Significant Diagnostic Tests:  Echocardiogram  Micro Data:  Cultures pending  Antimicrobials:  Received ceftriaxone in the ED for potential SBP  Interim History / Subjective:  Not applicable  Objective   Blood pressure (!) 82/49, pulse 95, temperature 98.5 F (36.9 C), resp. rate (!) 21, height 5\' 2"  (1.575 m), weight 41.7 kg, SpO2 98 %.        Intake/Output Summary (Last 24 hours) at 03/09/2020 2042 Last data filed at 03/09/2020 1913 Gross per 24 hour  Intake 1299.8 ml  Output 200 ml  Net 1099.8 ml   Filed Weights   03/09/20 1714  Weight: 41.7 kg    Examination: General: Patient alert and oriented.  Pleasant to interact with.  Able to communicate in a meaningful way. HENT: There is significant pallor. Some scleral icterus.  Lungs: Clear to auscultation bilaterally.  No evidence of increased work of breathing. Cardiovascular: Regular rate and rhythm.systolic murmur appreciated Abdomen: Soft nontender nondistended Extremities: No edema appreciated.  No contractures or deformities.  There is some evidence of muscle wasting Neuro: Grossly normal.  No focal deficits appreciated GU: Deferred  Resolved Hospital Problem list   Not applicable  Assessment & Plan:  This is a 59 year old female with history as noted above who presents for chief complaint of hematemesis.  On review of systems patient noted to have melena.   Hematemesis-most likely related to known esophageal varices.  However we will also entertain the idea of ulcer.  Patient denies any NSAID use or alcohol use. -Octreotide -Protonix -CBC  every every 6 hours -Blood cultures  -UA -Ceftriaxone daily for SBP prophylaxis -Continue to follow recommendations from GI as they have been consulted and made aware patient in the ED -Maintain patient n.p.o.  There are tentative plans for endoscopy in the a.m. with GI around 9:30 AM -In the morning can inquire further concerning steroids for autoimmune hepatitis. -If if nausea can use Zofran.  QTC checked and just around 480   Shock-likely hemorrhagic and distributive from liver failure -Patient with persistent hypotension in the setting of known cirrhosis -Obtain echo in a.m. -Levo as needed -Octreotide -Transfuse as needed patient already received 2 units of blood may need more we will continue to follow.  Maintain MAP of 65 and above -Central line and A-line placed for better monitoring and treatment of the above  Thrombocytopenia-likely allergic related to cirrhosis -Patient received platelets in the ED -Monitor platelets with daily CBC.  Transfuse if active bleeding   Best practice (evaluated daily)  Diet: N.p.o. for now Pain/Anxiety/Delirium protocol (if indicated): Not indicated VAP protocol (if indicated): Not indicated DVT prophylaxis: Held for now GI prophylaxis: Protonix as above Glucose control: evey 6 hours while NPO Mobility: Bedrest for now Disposition: ICU  Goals of Care:  Last date of multidisciplinary goals of care discussion: N/A Family and staff present: N/A Summary of discussion: N/A Follow up goals of care discussion due: N/A Code Status: Full code per patient  Labs   CBC: Recent Labs  Lab 03/09/20 1748 03/09/20 1802  WBC 4.4  --   NEUTROABS 3.1  --   HGB 8.1* 8.8*  HCT 24.5* 26.0*  MCV 104.7*  --   PLT 19*  --     Basic Metabolic Panel: Recent Labs  Lab 03/09/20 1748 03/09/20 1802  NA 135 136  K 3.0* 3.1*  CL 95* 95*  CO2 20*  --   GLUCOSE 108* 103*  BUN 19 20  CREATININE 0.77 0.30*  CALCIUM 8.9  --   MG 1.7  --     GFR: Estimated Creatinine Clearance: 48.6 mL/min (A) (by C-G formula based on SCr of 0.3 mg/dL (L)). Recent Labs  Lab 03/09/20 1748  WBC 4.4    Liver Function Tests: Recent Labs  Lab 03/09/20 1748  AST 148*  ALT 49*  ALKPHOS 52  BILITOT 7.8*  PROT 6.6  ALBUMIN 3.1*   Recent Labs  Lab 03/09/20 1748  LIPASE 96*   No results for input(s): AMMONIA in the last 168 hours.  ABG    Component Value Date/Time   PHART 7.452 (H) 12/22/2018 2318   PCO2ART 35.3 12/22/2018 2318   PO2ART 184.0 (H) 12/22/2018 2318   HCO3 24.6 12/22/2018 2318   TCO2 22 03/09/2020 1802   ACIDBASEDEF 1.0 12/22/2018 1937   O2SAT 100.0 12/22/2018 2318     Coagulation Profile: Recent Labs  Lab 03/09/20 1748  INR 1.9*    Cardiac Enzymes: No results for input(s): CKTOTAL, CKMB, CKMBINDEX, TROPONINI in the last 168 hours.  HbA1C: Hgb A1c MFr Bld  Date/Time Value Ref  Range Status  03/10/2016 12:37 PM 5.2 4.6 - 6.5 % Final    Comment:    Glycemic Control Guidelines for People with Diabetes:Non Diabetic:  <6%Goal of Therapy: <7%Additional Action Suggested:  >8%     CBG: No results for input(s): GLUCAP in the last 168 hours.  Review of Systems:   Pertinent positive and negative per HPI the remainder of the 12 point RO was negative.   Past Medical History:  She,  has a past medical history of Autoimmune hepatitis (HCC), Cirrhosis (HCC), Esophageal varices (HCC), Family history of adverse reaction to anesthesia, Heart murmur, Hyperlipidemia, and Raynaud's disease.   Surgical History:   Past Surgical History:  Procedure Laterality Date  . ABDOMINAL HYSTERECTOMY    . CRANIOTOMY Left 12/22/2018   Procedure: CRANIOTOMY HEMATOMA EVACUATION SUBDURAL;  Surgeon: Maeola Harman, MD;  Location: [facility] OR;  Service: Neurosurgery;  Laterality: Left;  . ESOPHAGOGASTRODUODENOSCOPY (EGD) WITH PROPOFOL N/A 06/15/2017   Procedure: ESOPHAGOGASTRODUODENOSCOPY (EGD) WITH PROPOFOL;  Surgeon: Benancio Deeds, MD;   Location: [facility] ENDOSCOPY;  Service: Gastroenterology;  Laterality: N/A;  . ORIF HUMERUS FRACTURE Right 05/23/2015   Procedure: OPEN REDUCTION INTERNAL FIXATION (ORIF) RIGHT  PROXIMAL HUMERUS FRACTURE;  Surgeon: Francena Hanly, MD;  Location: [facility] OR;  Service: Orthopedics;  Laterality: Right;  . WISDOM TOOTH EXTRACTION       Social History:   reports that she has never smoked. She has never used smokeless tobacco. She reports current alcohol use. She reports that she does not use drugs.   Family History:  Her family history includes Dementia in her father; Parkinson's disease in her mother; Stroke in her brother. There is no history of Colon cancer, Esophageal cancer, Pancreatic cancer, Rectal cancer, or Stomach cancer.   Allergies Allergies  Allergen Reactions  . Oxycodone-Acetaminophen Nausea And Vomiting and Other (See Comments)    Hallucinations and bad dreams     Home Medications  Prior to Admission medications   Medication Sig Start Date End Date Taking? Authorizing Provider  magnesium gluconate (MAGONATE) 500 MG tablet Take 500 mg by mouth daily as needed (for muscle spasms).     [provider]  NON FORMULARY Place 2 drops under the tongue daily. CBD Oil Doterra    [provider]  OVER THE COUNTER MEDICATION Take 3 tablets by mouth 2 (two) times daily. Doterra Lifelong Vitality Pack    [provider]  OVER THE COUNTER MEDICATION Take 1 tablet by mouth 2 (two) times daily. Bone Nutrient Supplement    [provider]     Critical care time: 60 minutes

## 2020-03-09 NOTE — ED Provider Notes (Signed)
[facility] EMERGENCY DEPARTMENT Provider Note   CSN: [id number] Arrival date & time: 03/09/20  1702     History Chief Complaint  Patient presents with  . Hematemesis    Michelle Schneider is a 59 y.o. female.  59 yo F with a chief complaints of hematemesis.  Going on since yesterday.  Has had about 4 bloody episodes of emesis.  Sometimes mixed with food but the last couple were grossly bloody.  Had one with EMS in route.  She denies any abdominal pain.  Has had some nausea so has not been eating.  Has had some dark stools for the past few days as well.  She denies any blood thinner use.  Has been feeling somewhat lightheaded and dizzy.  Denies cough congestion or fever.  The history is provided by the patient.  Illness Severity:  Severe Onset quality:  Sudden Duration:  1 day Timing:  Constant Progression:  Worsening Chronicity:  New Associated symptoms: nausea and vomiting   Associated symptoms: no abdominal pain, no chest pain, no congestion, no fever, no headaches, no myalgias, no rhinorrhea, no shortness of breath and no wheezing        Past Medical History:  Diagnosis Date  . Autoimmune hepatitis (HCC)   . Cirrhosis (HCC)   . Esophageal varices (HCC)   . Family history of adverse reaction to anesthesia    cousin had a difficult time waking up after appendectomy  . Heart murmur    "diagnosed with a very slight heart murmur in 2001 but no other doctor has been able to hear it"  . Hyperlipidemia   . Raynaud's disease     Patient Active Problem List   Diagnosis Date Noted  . Thrombocytopenia (HCC) 03/09/2020  . Hematemesis 03/09/2020  . Hemorrhagic shock (HCC) 03/09/2020  . GI bleed 03/09/2020  . Subdural hematoma (HCC) 12/22/2018  . SDH (subdural hematoma) (HCC) 12/22/2018  . Subdural bleeding (HCC) 12/22/2018  . Autoimmune hepatitis (HCC) 11/07/2015  . Hypoxia 08/24/2015  . Pleural effusion 08/24/2015  . Elevated bilirubin 08/24/2015  .  Liver cirrhosis (HCC) 08/24/2015  . Elevated INR 08/24/2015  . Anemia 08/24/2015  . Esophageal varices in cirrhosis (HCC)   . Ascites 08/23/2015    Past Surgical History:  Procedure Laterality Date  . ABDOMINAL HYSTERECTOMY    . CRANIOTOMY Left 12/22/2018   Procedure: CRANIOTOMY HEMATOMA EVACUATION SUBDURAL;  Surgeon: Maeola Harman, MD;  Location: [facility] OR;  Service: Neurosurgery;  Laterality: Left;  . ESOPHAGOGASTRODUODENOSCOPY (EGD) WITH PROPOFOL N/A 06/15/2017   Procedure: ESOPHAGOGASTRODUODENOSCOPY (EGD) WITH PROPOFOL;  Surgeon: Benancio Deeds, MD;  Location: [facility] ENDOSCOPY;  Service: Gastroenterology;  Laterality: N/A;  . ORIF HUMERUS FRACTURE Right 05/23/2015   Procedure: OPEN REDUCTION INTERNAL FIXATION (ORIF) RIGHT  PROXIMAL HUMERUS FRACTURE;  Surgeon: Francena Hanly, MD;  Location: [facility] OR;  Service: Orthopedics;  Laterality: Right;  . WISDOM TOOTH EXTRACTION       OB History   No obstetric history on file.     Family History  Problem Relation Age of Onset  . Dementia Father   . Parkinson's disease Mother   . Stroke Brother   . Colon cancer Neg Hx   . Esophageal cancer Neg Hx   . Pancreatic cancer Neg Hx   . Rectal cancer Neg Hx   . Stomach cancer Neg Hx     Social History   Tobacco Use  . Smoking status: Never Smoker  . Smokeless tobacco: Never Used  Vaping Use  .  Vaping Use: Never used  Substance Use Topics  . Alcohol use: Yes    Alcohol/week: 0.0 standard drinks    Comment: occasional  . Drug use: No    Home Medications Prior to Admission medications   Medication Sig Start Date End Date Taking? Authorizing Provider  Lavender Oil OIL Apply 1 application topically 2 (two) times daily.   Yes [provider]  Lemon Oil OIL Take 6 drops by mouth daily.   Yes [provider]  NON FORMULARY Place 2 drops under the tongue daily. CBD Oil Doterra   Yes [provider]  ORANGE PO Take 5 drops by mouth daily.   Yes [provider]   OVER THE COUNTER MEDICATION Take 3 tablets by mouth 2 (two) times daily. Doterra Lifelong Vitality Pack   Yes [provider]  OVER THE COUNTER MEDICATION Apply 1 application topically 2 (two) times daily as needed (essential oil supplement for digestive).   Yes [provider]    Allergies    Oxycodone-acetaminophen  Review of Systems   Review of Systems  Constitutional: Negative for chills and fever.  HENT: Negative for congestion and rhinorrhea.   Eyes: Negative for redness and visual disturbance.  Respiratory: Negative for shortness of breath and wheezing.   Cardiovascular: Negative for chest pain and palpitations.  Gastrointestinal: Positive for blood in stool, nausea and vomiting. Negative for abdominal distention and abdominal pain.  Genitourinary: Negative for dysuria and urgency.  Musculoskeletal: Negative for arthralgias and myalgias.  Skin: Negative for pallor and wound.  Neurological: Negative for dizziness and headaches.    Physical Exam Updated Vital Signs BP 96/64   Pulse (!) 105   Temp 98.5 F (36.9 C)   Resp 14   Ht  (1.575 m)   Wt 41.7 kg   SpO2 100%   BMI 16.83 kg/m   Physical Exam Vitals and nursing note reviewed.  Constitutional:      General: She is not in acute distress.    Appearance: She is well-developed and well-nourished. She is not diaphoretic.     Comments: Pale and slightly jaundiced  HENT:     Head: Normocephalic and atraumatic.  Eyes:     Extraocular Movements: EOM normal.     Pupils: Pupils are equal, round, and reactive to light.  Cardiovascular:     Rate and Rhythm: Regular rhythm. Tachycardia present.     Heart sounds: No murmur heard. No friction rub. No gallop.   Pulmonary:     Effort: Pulmonary effort is normal.     Breath sounds: No wheezing or rales.  Abdominal:     General: There is no distension.     Palpations: Abdomen is soft.     Tenderness: There is no abdominal tenderness.   Musculoskeletal:        General: No tenderness or edema.     Cervical back: Normal range of motion and neck supple.  Skin:    General: Skin is warm and dry.  Neurological:     Mental Status: She is alert and oriented to person, place, and time.  Psychiatric:        Mood and Affect: Mood and affect normal.        Behavior: Behavior normal.     ED Results / Procedures / Treatments   Labs (all labs ordered are listed, but only abnormal results are displayed) Labs Reviewed  CBC WITH DIFFERENTIAL/PLATELET - Abnormal; Notable for the following components:  Result Value   RBC 2.34 (*)    Hemoglobin 8.1 (*)    HCT 24.5 (*)    MCV 104.7 (*)    MCH 34.6 (*)    Platelets 19 (*)    Lymphs Abs 0.6 (*)    All other components within normal limits  COMPREHENSIVE METABOLIC PANEL - Abnormal; Notable for the following components:   Potassium 3.0 (*)    Chloride 95 (*)    CO2 20 (*)    Glucose, Bld 108 (*)    Albumin 3.1 (*)    AST 148 (*)    ALT 49 (*)    Total Bilirubin 7.8 (*)    Anion gap 20 (*)    All other components within normal limits  LIPASE, BLOOD - Abnormal; Notable for the following components:   Lipase 96 (*)    All other components within normal limits  PROTIME-INR - Abnormal; Notable for the following components:   Prothrombin Time 20.8 (*)    INR 1.9 (*)    All other components within normal limits  I-STAT CHEM 8, ED - Abnormal; Notable for the following components:   Potassium 3.1 (*)    Chloride 95 (*)    Creatinine, Ser 0.30 (*)    Glucose, Bld 103 (*)    Calcium, Ion 1.12 (*)    Hemoglobin 8.8 (*)    HCT 26.0 (*)    All other components within normal limits  RESP PANEL BY RT-PCR (FLU A&B, COVID) ARPGX2  CULTURE, BLOOD (ROUTINE X 2)  CULTURE, BLOOD (ROUTINE X 2)  MAGNESIUM  COMPREHENSIVE METABOLIC PANEL  CBC  CBC  LACTIC ACID, PLASMA  LACTIC ACID, PLASMA  HIV ANTIBODY (ROUTINE TESTING W REFLEX)  LIPASE, BLOOD  LACTIC ACID, PLASMA  LACTIC ACID,  PLASMA  CBC  URINALYSIS, ROUTINE W REFLEX MICROSCOPIC  MAGNESIUM  PHOSPHORUS  CBC  TYPE AND SCREEN  PREPARE PLATELET PHERESIS  PREPARE RBC (CROSSMATCH)    EKG EKG Interpretation  Date/Time:  Saturday March 09 2020 17:21:41 EST Ventricular Rate:  103 PR Interval:    QRS Duration: 80 QT Interval:  370 QTC Calculation: 485 R Axis:   64 Text Interpretation: Sinus tachycardia Otherwise no significant change Confirmed by Melene Plan [phone]) on 03/09/2020 5:33:15 PM   Radiology No results found.  Procedures Procedures (including critical care time)  Medications Ordered in ED Medications  octreotide (SANDOSTATIN) 2 mcg/mL load via infusion 50 mcg (50 mcg Intravenous Bolus from Bag 03/09/20 1856)    And  octreotide (SANDOSTATIN) 500 mcg in sodium chloride 0.9 % 250 mL (2 mcg/mL) infusion (50 mcg/hr Intravenous New Bag/Given 03/09/20 1856)  pantoprazole (PROTONIX) 80 mg in sodium chloride 0.9 % 100 mL (0.8 mg/mL) infusion (8 mg/hr Intravenous New Bag/Given 03/09/20 1857)  pantoprazole (PROTONIX) injection 40 mg (has no administration in time range)  0.9 %  sodium chloride infusion (Manually program via Guardrails IV Fluids) (has no administration in time range)  potassium chloride 10 mEq in 100 mL IVPB (has no administration in time range)  cefTRIAXone (ROCEPHIN) 1 g in sodium chloride 0.9 % 100 mL IVPB (has no administration in time range)  0.9 %  sodium chloride infusion (Manually program via Guardrails IV Fluids) (has no administration in time range)  0.9 %  sodium chloride infusion (Manually program via Guardrails IV Fluids) (has no administration in time range)  docusate sodium (COLACE) capsule 100 mg (has no administration in time range)  polyethylene glycol (MIRALAX / GLYCOLAX) packet 17 g (has no  administration in time range)  sodium chloride 0.9 % bolus 1,000 mL (0 mLs Intravenous Stopped 03/09/20 1852)  cefTRIAXone (ROCEPHIN) 1 g in sodium chloride 0.9 % 100 mL IVPB (  Intravenous Stopped 03/09/20 1839)  pantoprazole (PROTONIX) injection 40 mg (40 mg Intravenous Given 03/09/20 1809)  phytonadione (VITAMIN K) 10 mg in dextrose 5 % 50 mL IVPB (0 mg Intravenous Stopped 03/09/20 2032)  ondansetron (ZOFRAN) injection 4 mg (4 mg Intravenous Given 03/09/20 1937)  morphine 2 MG/ML injection (2 mg Intravenous Given 03/09/20 2226)    ED Course  I have reviewed the triage vital signs and the nursing notes.  Pertinent labs & imaging results that were available during my care of the patient were reviewed by me and considered in my medical decision making (see chart for details).    MDM Rules/Calculators/A&P                          59 yo F with a chief complaints of hematemesis.  Has had 4 episodes since yesterday.  At one with EMS it was described as grossly bloody.  Patient has a history of autoimmune hepatitis and had an endoscopy a couple years ago that showed she had varices.  Concern for possible variceal bleeding.  Will start on Protonix octreotide Rocephin.  Give a bolus of fluid.  Check blood work.  I discussed the case with Dr. Adela Lank, the [facility] gastroenterology.  Recommended a Protonix drip.  Would plan to do endoscopy in the morning unless the patient had worsening bleeding.   Patient having downward trending blood pressures.  Seen by the hospitalist with concern for worsening hemodynamic status discussed with critical care who will admit.  CRITICAL CARE Performed by: Rae Roam   Total critical care time: 80 minutes  Critical care time was exclusive of separately billable procedures and treating other patients.  Critical care was necessary to treat or prevent imminent or life-threatening deterioration.  Critical care was time spent personally by me on the following activities: development of treatment plan with patient and/or surrogate as well as nursing, discussions with consultants, evaluation of patient's response to treatment,  examination of patient, obtaining history from patient or surrogate, ordering and performing treatments and interventions, ordering and review of laboratory studies, ordering and review of radiographic studies, pulse oximetry and re-evaluation of patient's condition.  The patients results and plan were reviewed and discussed.   Any x-rays performed were independently reviewed by myself.   Differential diagnosis were considered with the presenting HPI.  Medications  octreotide (SANDOSTATIN) 2 mcg/mL load via infusion 50 mcg (50 mcg Intravenous Bolus from Bag 03/09/20 1856)    And  octreotide (SANDOSTATIN) 500 mcg in sodium chloride 0.9 % 250 mL (2 mcg/mL) infusion (50 mcg/hr Intravenous New Bag/Given 03/09/20 1856)  pantoprazole (PROTONIX) 80 mg in sodium chloride 0.9 % 100 mL (0.8 mg/mL) infusion (8 mg/hr Intravenous New Bag/Given 03/09/20 1857)  pantoprazole (PROTONIX) injection 40 mg (has no administration in time range)  0.9 %  sodium chloride infusion (Manually program via Guardrails IV Fluids) (has no administration in time range)  potassium chloride 10 mEq in 100 mL IVPB (has no administration in time range)  cefTRIAXone (ROCEPHIN) 1 g in sodium chloride 0.9 % 100 mL IVPB (has no administration in time range)  0.9 %  sodium chloride infusion (Manually program via Guardrails IV Fluids) (has no administration in time range)  0.9 %  sodium chloride infusion (Manually program via  Guardrails IV Fluids) (has no administration in time range)  docusate sodium (COLACE) capsule 100 mg (has no administration in time range)  polyethylene glycol (MIRALAX / GLYCOLAX) packet 17 g (has no administration in time range)  sodium chloride 0.9 % bolus 1,000 mL (0 mLs Intravenous Stopped 03/09/20 1852)  cefTRIAXone (ROCEPHIN) 1 g in sodium chloride 0.9 % 100 mL IVPB ( Intravenous Stopped 03/09/20 1839)  pantoprazole (PROTONIX) injection 40 mg (40 mg Intravenous Given 03/09/20 1809)  phytonadione (VITAMIN K)  10 mg in dextrose 5 % 50 mL IVPB (0 mg Intravenous Stopped 03/09/20 2032)  ondansetron (ZOFRAN) injection 4 mg (4 mg Intravenous Given 03/09/20 1937)  morphine 2 MG/ML injection (2 mg Intravenous Given 03/09/20 2226)    Vitals:   03/09/20 2120 03/09/20 2130 03/09/20 2145 03/09/20 2200  BP: 98/61 106/78  96/64  Pulse: 98 (!) 105 (!) 109 (!) 105  Resp: 14 16 16 14   Temp:      TempSrc:      SpO2: 100% 97% 100% 100%  Weight:      Height:        Final diagnoses:  Acute upper GI bleed  Hematemesis, presence of nausea not specified  History of esophageal varices    Admission/ observation were discussed with the admitting physician, patient and/or family and they are comfortable with the plan.    Final Clinical Impression(s) / ED Diagnoses Final diagnoses:  Acute upper GI bleed  Hematemesis, presence of nausea not specified  History of esophageal varices    Rx / DC Orders ED Discharge Orders    None       Melene PlanFloyd, Jehieli Brassell, DO 03/09/20 2235

## 2020-03-09 NOTE — Consult Note (Signed)
Medical Consultation   JAMYIAH LABELLA  WNI:[id number]  DOB: 1960/08/09  DOA: 03/09/2020  PCP: Patient, No Pcp Per    Requesting physician: Dr. Adela Lank  Reason for consultation: Hematemesis   History of Present Illness: CALIOPE RUPPERT is an 59 y.o. female with h/o cirrhosis, esophageal varices on prior EGD.  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  Pt is not on any anticoagulants at baseline.  Pt presents to ED with now 5 episodes of hematemesis including 1 with EMS and 1 at 7pm here in ED (200cc bright red blood per nursing).  Symptoms onset today.  Symptoms are severe.  Symptoms are constant.  Nothing makes better or worse.  Pt wisely decided to come in to hospital due to vomiting up blood.  Platelets 19, INR 1.9, HGB 8.1.  EDP spoke with Dr. Adela Lank  6 pack of platelets ordered.  In the ED her BP has trended down: was 130s systolic as of 7pm, now in the 80s systolic.  Mild s. tachycardia to 110 initially, now 95.  Lactate pending, AG 20.   Review of Systems:  ROS As per HPI otherwise 10 point review of systems negative.     Past Medical History: Past Medical History:  Diagnosis Date  . Autoimmune hepatitis (HCC)   . Cirrhosis (HCC)   . Esophageal varices (HCC)   . Family history of adverse reaction to anesthesia    cousin had a difficult time waking up after appendectomy  . Heart murmur    "diagnosed with a very slight heart murmur in 2001 but no other doctor has been able to hear it"  . Hyperlipidemia   . Raynaud's disease     Past Surgical History: Past Surgical History:  Procedure Laterality Date  . ABDOMINAL HYSTERECTOMY    . CRANIOTOMY Left 12/22/2018   Procedure: CRANIOTOMY HEMATOMA EVACUATION SUBDURAL;  Surgeon: Maeola Harman, MD;  Location[facility] OR;  Service: Neurosurgery;  Laterality: Left;  . ESOPHAGOGASTRODUODENOSCOPY (EGD) WITH PROPOFOL N/A 06/15/2017   Procedure: ESOPHAGOGASTRODUODENOSCOPY (EGD) WITH PROPOFOL;   Surgeon: Benancio Deeds, MD;  Location: [facility] ENDOSCOPY;  Service: Gastroenterology;  Laterality: N/A;  . ORIF HUMERUS FRACTURE Right 05/23/2015   Procedure: OPEN REDUCTION INTERNAL FIXATION (ORIF) RIGHT  PROXIMAL HUMERUS FRACTURE;  Surgeon: Francena Hanly, MD;  Location: [facility] OR;  Service: Orthopedics;  Laterality: Right;  . WISDOM TOOTH EXTRACTION       Allergies:   Allergies  Allergen Reactions  . Oxycodone-Acetaminophen Nausea And Vomiting and Other (See Comments)    Hallucinations and bad dreams     Social History:  reports that she has never smoked. She has never used smokeless tobacco. She reports current alcohol use. She reports that she does not use drugs.   Family History: Family History  Problem Relation Age of Onset  . Dementia Father   . Parkinson's disease Mother   . Stroke Brother   . Colon cancer Neg Hx   . Esophageal cancer Neg Hx   . Pancreatic cancer Neg Hx   . Rectal cancer Neg Hx   . Stomach cancer Neg Hx        Physical Exam: Vitals:   03/09/20 2002 03/09/20 2010 03/09/20 2017 03/09/20 2020  BP: (!) 81/55 (!) 91/59 90/60 (!) 82/49  Pulse: 92 95 90 95  Resp: (!) 21 16 14  (!) 21  Temp: 98.5 F (36.9 C)  98.5 F (36.9 C)  TempSrc: Oral     SpO2: 97% 99% 98% 98%  Weight:      Height:        Constitutional: Alert and awake, oriented x3, not in any acute distress. Eyes: PERLA, EOMI, irises appear normal, anicteric sclera,  ENMT: external ears and nose appear normal            Lips appears normal, oropharynx mucosa, tongue, posterior pharynx appear normal  Neck: neck appears normal, no masses, normal ROM, no thyromegaly, no JVD  CVS: S1-S2 clear, no murmur rubs or gallops, no LE edema, normal pedal pulses  Respiratory:  clear to auscultation bilaterally, no wheezing, rales or rhonchi. Respiratory effort normal. No accessory muscle use.  Abdomen: soft nontender, nondistended, normal bowel sounds, no hepatosplenomegaly, no hernias   Musculoskeletal: : no cyanosis, clubbing or edema noted bilaterally Neuro: Cranial nerves II-XII intact, strength, sensation, reflexes Psych: judgement and insight appear normal, stable mood and affect, mental status Skin: no rashes or lesions or ulcers, no induration or nodules    Data reviewed:  I have personally reviewed following labs and imaging studies Labs:  CBC: Recent Labs  Lab 03/09/20 1748 03/09/20 1802  WBC 4.4  --   NEUTROABS 3.1  --   HGB 8.1* 8.8*  HCT 24.5* 26.0*  MCV 104.7*  --   PLT 19*  --     Basic Metabolic Panel: Recent Labs  Lab 03/09/20 1748 03/09/20 1802  NA 135 136  K 3.0* 3.1*  CL 95* 95*  CO2 20*  --   GLUCOSE 108* 103*  BUN 19 20  CREATININE 0.77 0.30*  CALCIUM 8.9  --   MG 1.7  --    GFR Estimated Creatinine Clearance: 48.6 mL/min (A) (by C-G formula based on SCr of 0.3 mg/dL (L)). Liver Function Tests: Recent Labs  Lab 03/09/20 1748  AST 148*  ALT 49*  ALKPHOS 52  BILITOT 7.8*  PROT 6.6  ALBUMIN 3.1*   Recent Labs  Lab 03/09/20 1748  LIPASE 96*   No results for input(s): AMMONIA in the last 168 hours. Coagulation profile Recent Labs  Lab 03/09/20 1748  INR 1.9*    Cardiac Enzymes: No results for input(s): CKTOTAL, CKMB, CKMBINDEX, TROPONINI in the last 168 hours. BNP: Invalid input(s): POCBNP CBG: No results for input(s): GLUCAP in the last 168 hours. D-Dimer No results for input(s): DDIMER in the last 72 hours. Hgb A1c No results for input(s): HGBA1C in the last 72 hours. Lipid Profile No results for input(s): CHOL, HDL, LDLCALC, TRIG, CHOLHDL, LDLDIRECT in the last 72 hours. Thyroid function studies No results for input(s): TSH, T4TOTAL, T3FREE, THYROIDAB in the last 72 hours.  Invalid input(s): FREET3 Anemia work up No results for input(s): VITAMINB12, FOLATE, FERRITIN, TIBC, IRON, RETICCTPCT in the last 72 hours. Urinalysis    Component Value Date/Time   COLORURINE AMBER (A) 08/23/2015 1745    APPEARANCEUR CLEAR 08/23/2015 1745   LABSPEC 1.017 08/23/2015 1745   PHURINE 5.0 08/23/2015 1745   GLUCOSEU NEGATIVE 08/23/2015 1745   HGBUR NEGATIVE 08/23/2015 1745   BILIRUBINUR NEGATIVE 08/23/2015 1745   KETONESUR 15 (A) 08/23/2015 1745   PROTEINUR NEGATIVE 08/23/2015 1745   NITRITE NEGATIVE 08/23/2015 1745   LEUKOCYTESUR NEGATIVE 08/23/2015 1745     Sepsis Labs Invalid input(s): PROCALCITONIN,  WBC,  LACTICIDVEN Microbiology Recent Results (from the past 240 hour(s))  Resp Panel by RT-PCR (Flu A&B, Covid) Nasopharyngeal Swab     Status: None   Collection Time: 03/09/20  5:48  PM   Specimen: Nasopharyngeal Swab; Nasopharyngeal(NP) swabs in vial transport medium  Result Value Ref Range Status   SARS Coronavirus 2 by RT PCR NEGATIVE NEGATIVE Final    Comment: (NOTE) SARS-CoV-2 target nucleic acids are NOT DETECTED.  The SARS-CoV-2 RNA is generally detectable in upper respiratory specimens during the acute phase of infection. The lowest concentration of SARS-CoV-2 viral copies this assay can detect is 138 copies/mL. A negative result does not preclude SARS-Cov-2 infection and should not be used as the sole basis for treatment or other patient management decisions. A negative result may occur with  improper specimen collection/handling, submission of specimen other than nasopharyngeal swab, presence of viral mutation(s) within the areas targeted by this assay, and inadequate number of viral copies(<138 copies/mL). A negative result must be combined with clinical observations, patient history, and epidemiological information. The expected result is Negative.  Fact Sheet for Patients:  BloggerCourse.com  Fact Sheet for Healthcare Providers:  SeriousBroker.it  This test is no t yet approved or cleared by the [place] FDA and  has been authorized for detection and/or diagnosis of SARS-CoV-2 by FDA under an Emergency Use  Authorization (EUA). This EUA will remain  in effect (meaning this test can be used) for the duration of the COVID-19 declaration under Section 564(b)(1) of the Act, 21 U.S.C.section 360bbb-3(b)(1), unless the authorization is terminated  or revoked sooner.       Influenza A by PCR NEGATIVE NEGATIVE Final   Influenza B by PCR NEGATIVE NEGATIVE Final    Comment: (NOTE) The Xpert Xpress SARS-CoV-2/FLU/RSV plus assay is intended as an aid in the diagnosis of influenza from Nasopharyngeal swab specimens and should not be used as a sole basis for treatment. Nasal washings and aspirates are unacceptable for Xpert Xpress SARS-CoV-2/FLU/RSV testing.  Fact Sheet for Patients: BloggerCourse.com  Fact Sheet for Healthcare Providers: SeriousBroker.it  This test is not yet approved or cleared by the [place] FDA and has been authorized for detection and/or diagnosis of SARS-CoV-2 by FDA under an Emergency Use Authorization (EUA). This EUA will remain in effect (meaning this test can be used) for the duration of the COVID-19 declaration under Section 564(b)(1) of the Act, 21 U.S.C. section 360bbb-3(b)(1), unless the authorization is terminated or revoked.  Performed at [facility], [address] [place], [place] [address]        Inpatient Medications:   Scheduled Meds: . sodium chloride   Intravenous Once  . sodium chloride   Intravenous Once  . sodium chloride   Intravenous Once  . [START ON 03/13/2020] pantoprazole  40 mg Intravenous Q12H   Continuous Infusions: . [START ON 03/10/2020] cefTRIAXone (ROCEPHIN)  IV    . octreotide  (SANDOSTATIN)    IV infusion 50 mcg/hr (03/09/20 1856)  . pantoprozole (PROTONIX) infusion 8 mg/hr (03/09/20 1857)  . potassium chloride       Radiological Exams on Admission: No results found.  Impression/Recommendations Principal Problem:   Hemorrhagic shock (HCC) Active  Problems:   Liver cirrhosis (HCC)   Elevated INR   Esophageal varices in cirrhosis (HCC)   Autoimmune hepatitis (HCC)   Thrombocytopenia (HCC)   Hematemesis  1. Hematemesis, acute blood loss anemia, likely variceal bleed - now progressing towards hemorrhagic shock 1. Pt with 5 total episodes of hematemesis thus far with last being 200cc bright red blood at 7pm here in ED 2. Spoke again with Dr. Adela Lank: 1. Try to get platelets on board for hemostasis 2. No benefit to  FFP for INR 1.9 in cirrhosis 3. Still planning on AM EGD 4. If pt really opens up and she needs to be intubated tonight then he will come in and do bedside EGD more emergently 3. Spoke with Dr. Mikki Harbor (who is now at bedside): 1. Planning to take to ICU 2. Checking Lactic acid 4. Tele monitor 5. 2u PRBC transfusion ordered, with 2 to stay ahead 6. Ordered CBC q6h (though this was before transfusion ordered) 7. Repeat CMP in AM 8. Platelets are transfusing now 9. Octreotide gtt 10. Protonix gtt 11. Got rocephin for SBP ppx 2. Cirrhosis - 1. Known h/o esophageal varices on prior EGD 2. Cirrhosis is secondary to autoimmune hepatitis 3. Thrombocytopenia - 1. Acute on chronic 2. Due to cirrhosis 4. INR elevation - 1. Not on any anticoagulants 2. Due to cirrhosis 3. History of same 5. Hypokalemia - 1. 4 runs IV K ordered for replacement    CRITICAL CARE Performed by: Hillary Bow.   Total critical care time: 80 minutes  Critical care time was exclusive of separately billable procedures and treating other patients.  Critical care was necessary to treat or prevent imminent or life-threatening deterioration.  Critical care was time spent personally by me on the following activities: development of treatment plan with patient and/or surrogate as well as nursing, discussions with consultants, evaluation of patient's response to treatment, examination of patient, obtaining history from patient or  surrogate, ordering and performing treatments and interventions, ordering and review of laboratory studies, ordering and review of radiographic studies, pulse oximetry and re-evaluation of patient's condition.   Thank you for this consultation.  Our [facility] hospitalist team will follow the patient with you.   Dewight Catino M. D.O. [facility] 03/09/2020, 8:46 PM

## 2020-03-09 NOTE — ED Notes (Signed)
Notified Floyd DO about platelet count of 19,000

## 2020-03-09 NOTE — ED Notes (Signed)
Report to Michelle, RN

## 2020-03-09 NOTE — ED Triage Notes (Signed)
Pt arrived to ED via EMS from home w/ c/o hematemesis since yesterday. Pt reports 4 episodes of vomiting bright red blood yesterday and 3 episodes of bright red blood today. Pt also reports weakness and dizziness. EMS gave pt of NS. VS BP 107/87, HR 96, O2 99% RA, RR 20, T 97.9, CBG 132 18G L FA

## 2020-03-09 NOTE — Progress Notes (Addendum)
eLink Physician-Brief Progress Note Patient Name: Michelle Schneider DOB: 1961-03-07 MRN: [id number]   Date of Service  03/09/2020  HPI/Events of Note  59 year old woman with GI bleed, admitted by PCCM. Planned for 2 units RBC and getting her first one now. CVC placed is malpositioned and not being used at this time. Stable vitals.   eICU Interventions  Care plan reviewed with RN      Intervention Category Major Interventions: Hemorrhage - evaluation and management Evaluation Type: New Patient Evaluation  Oretha Milch 03/09/2020, 11:59 PM   12:05 am - discussed with bedside PCCM who will be replacing the central line

## 2020-03-10 ENCOUNTER — Encounter (HOSPITAL_COMMUNITY): Payer: Self-pay | Admitting: Pulmonary Disease

## 2020-03-10 ENCOUNTER — Inpatient Hospital Stay (HOSPITAL_COMMUNITY): Payer: No Typology Code available for payment source

## 2020-03-10 ENCOUNTER — Inpatient Hospital Stay (HOSPITAL_COMMUNITY): Payer: No Typology Code available for payment source | Admitting: Certified Registered Nurse Anesthetist

## 2020-03-10 ENCOUNTER — Encounter (HOSPITAL_COMMUNITY)
Admission: EM | Disposition: A | Discharge status: Home Health | Payer: Self-pay | Source: Home / Self Care | Attending: Internal Medicine

## 2020-03-10 DIAGNOSIS — I8510 Secondary esophageal varices without bleeding: Secondary | ICD-10-CM

## 2020-03-10 DIAGNOSIS — K920 Hematemesis: Secondary | ICD-10-CM

## 2020-03-10 DIAGNOSIS — D696 Thrombocytopenia, unspecified: Secondary | ICD-10-CM

## 2020-03-10 DIAGNOSIS — Z8719 Personal history of other diseases of the digestive system: Secondary | ICD-10-CM

## 2020-03-10 DIAGNOSIS — K7469 Other cirrhosis of liver: Secondary | ICD-10-CM

## 2020-03-10 DIAGNOSIS — R578 Other shock: Secondary | ICD-10-CM | POA: Diagnosis not present

## 2020-03-10 DIAGNOSIS — K754 Autoimmune hepatitis: Secondary | ICD-10-CM | POA: Diagnosis not present

## 2020-03-10 DIAGNOSIS — K922 Gastrointestinal hemorrhage, unspecified: Secondary | ICD-10-CM | POA: Diagnosis not present

## 2020-03-10 DIAGNOSIS — I361 Nonrheumatic tricuspid (valve) insufficiency: Secondary | ICD-10-CM

## 2020-03-10 DIAGNOSIS — R011 Cardiac murmur, unspecified: Secondary | ICD-10-CM

## 2020-03-10 HISTORY — PX: ESOPHAGEAL BANDING: SHX5518

## 2020-03-10 HISTORY — PX: ESOPHAGOGASTRODUODENOSCOPY (EGD) WITH PROPOFOL: SHX5813

## 2020-03-10 HISTORY — PX: HEMOSTASIS CLIP PLACEMENT: SHX6857

## 2020-03-10 LAB — COMPREHENSIVE METABOLIC PANEL
ALT: 46 U/L — ABNORMAL HIGH (ref 0–44)
ALT: 46 U/L — ABNORMAL HIGH (ref 0–44)
AST: 145 U/L — ABNORMAL HIGH (ref 15–41)
AST: 155 U/L — ABNORMAL HIGH (ref 15–41)
Albumin: 2.6 g/dL — ABNORMAL LOW (ref 3.5–5.0)
Albumin: 2.5 g/dL — ABNORMAL LOW (ref 3.5–5.0)
Alkaline Phosphatase: 39 U/L (ref 38–126)
Alkaline Phosphatase: 41 U/L (ref 38–126)
Anion gap: 18 — ABNORMAL HIGH (ref 5–15)
Anion gap: 12 (ref 5–15)
BUN: 20 mg/dL (ref 8–23)
BUN: 19 mg/dL (ref 8–23)
CO2: 18 mmol/L — ABNORMAL LOW (ref 22–32)
CO2: 23 mmol/L (ref 22–32)
Calcium: 8.1 mg/dL — ABNORMAL LOW (ref 8.9–10.3)
Calcium: 8.1 mg/dL — ABNORMAL LOW (ref 8.9–10.3)
Chloride: 103 mmol/L (ref 98–111)
Chloride: 103 mmol/L (ref 98–111)
Creatinine, Ser: 0.75 mg/dL (ref 0.44–1.00)
Creatinine, Ser: 0.69 mg/dL (ref 0.44–1.00)
GFR, Estimated: >60 mL/min (ref >60)
GFR, Estimated: >60 mL/min (ref >60)
Glucose, Bld: 115 mg/dL — ABNORMAL HIGH (ref 70–99)
Glucose, Bld: 127 mg/dL — ABNORMAL HIGH (ref 70–99)
Potassium: 3.5 mmol/L (ref 3.5–5.1)
Potassium: 3.8 mmol/L (ref 3.5–5.1)
Sodium: 139 mmol/L (ref 135–145)
Sodium: 138 mmol/L (ref 135–145)
Total Bilirubin: 9.9 mg/dL — ABNORMAL HIGH (ref 0.3–1.2)
Total Bilirubin: 9.9 mg/dL — ABNORMAL HIGH (ref 0.3–1.2)
Total Protein: 5.4 g/dL — ABNORMAL LOW (ref 6.5–8.1)
Total Protein: 5 g/dL — ABNORMAL LOW (ref 6.5–8.1)

## 2020-03-10 LAB — CBC
HCT: 24 % — ABNORMAL LOW (ref 36.0–46.0)
HCT: 25.2 % — ABNORMAL LOW (ref 36.0–46.0)
HCT: 23.8 % — ABNORMAL LOW (ref 36.0–46.0)
HCT: 22.6 % — ABNORMAL LOW (ref 36.0–46.0)
Hemoglobin: 8.6 g/dL — ABNORMAL LOW (ref 12.0–15.0)
Hemoglobin: 8.6 g/dL — ABNORMAL LOW (ref 12.0–15.0)
Hemoglobin: 8.5 g/dL — ABNORMAL LOW (ref 12.0–15.0)
Hemoglobin: 7.8 g/dL — ABNORMAL LOW (ref 12.0–15.0)
MCH: 32.8 pg (ref 26.0–34.0)
MCH: 31.9 pg (ref 26.0–34.0)
MCH: 32.6 pg (ref 26.0–34.0)
MCH: 31.8 pg (ref 26.0–34.0)
MCHC: 35.8 g/dL (ref 30.0–36.0)
MCHC: 34.1 g/dL (ref 30.0–36.0)
MCHC: 35.7 g/dL (ref 30.0–36.0)
MCHC: 34.5 g/dL (ref 30.0–36.0)
MCV: 91.6 fL (ref 80.0–100.0)
MCV: 93.3 fL (ref 80.0–100.0)
MCV: 91.2 fL (ref 80.0–100.0)
MCV: 92.2 fL (ref 80.0–100.0)
Platelets: 48 10*3/uL — ABNORMAL LOW (ref 150–400)
Platelets: 48 10*3/uL — ABNORMAL LOW (ref 150–400)
Platelets: 48 10*3/uL — ABNORMAL LOW (ref 150–400)
Platelets: 41 10*3/uL — ABNORMAL LOW (ref 150–400)
RBC: 2.62 MIL/uL — ABNORMAL LOW (ref 3.87–5.11)
RBC: 2.7 MIL/uL — ABNORMAL LOW (ref 3.87–5.11)
RBC: 2.61 MIL/uL — ABNORMAL LOW (ref 3.87–5.11)
RBC: 2.45 MIL/uL — ABNORMAL LOW (ref 3.87–5.11)
RDW: 18.6 % — ABNORMAL HIGH (ref 11.5–15.5)
RDW: 19.4 % — ABNORMAL HIGH (ref 11.5–15.5)
RDW: 19.8 % — ABNORMAL HIGH (ref 11.5–15.5)
RDW: 19.8 % — ABNORMAL HIGH (ref 11.5–15.5)
WBC: 4.5 10*3/uL (ref 4.0–10.5)
WBC: 4.2 10*3/uL (ref 4.0–10.5)
WBC: 5.8 10*3/uL (ref 4.0–10.5)
WBC: 5.6 10*3/uL (ref 4.0–10.5)
nRBC: 0.7 % — ABNORMAL HIGH (ref 0.0–0.2)
nRBC: 0 % (ref 0.0–0.2)
nRBC: 0.5 % — ABNORMAL HIGH (ref 0.0–0.2)
nRBC: 0.9 % — ABNORMAL HIGH (ref 0.0–0.2)

## 2020-03-10 LAB — LIPASE, BLOOD: Lipase: 65 U/L — ABNORMAL HIGH (ref 11–51)

## 2020-03-10 LAB — HEMOGLOBIN AND HEMATOCRIT, BLOOD
HCT: 27.9 % — ABNORMAL LOW (ref 36.0–46.0)
Hemoglobin: 9.3 g/dL — ABNORMAL LOW (ref 12.0–15.0)

## 2020-03-10 LAB — URINALYSIS, ROUTINE W REFLEX MICROSCOPIC
Bacteria, UA: NONE SEEN
Glucose, UA: NEGATIVE mg/dL
Ketones, ur: 80 mg/dL — AB
Leukocytes,Ua: NEGATIVE
Nitrite: NEGATIVE
Protein, ur: NEGATIVE mg/dL
Specific Gravity, Urine: 1.026 (ref 1.005–1.030)
pH: 6 (ref 5.0–8.0)

## 2020-03-10 LAB — BPAM PLATELET PHERESIS
Blood Product Expiration Date: 202112272359
ISSUE DATE / TIME: 202112251944
Unit Type and Rh: 5100

## 2020-03-10 LAB — LACTIC ACID, PLASMA
Lactic Acid, Venous: 8.5 mmol/L (ref 0.5–1.9)
Lactic Acid, Venous: 5.9 mmol/L (ref 0.5–1.9)
Lactic Acid, Venous: 2 mmol/L (ref 0.5–1.9)

## 2020-03-10 LAB — PREPARE PLATELET PHERESIS: Unit division: 0

## 2020-03-10 LAB — AMMONIA: Ammonia: 65 umol/L — ABNORMAL HIGH (ref 9–35)

## 2020-03-10 LAB — HIV ANTIBODY (ROUTINE TESTING W REFLEX): HIV Screen 4th Generation wRfx: NONREACTIVE

## 2020-03-10 LAB — MRSA PCR SCREENING: MRSA by PCR: NEGATIVE

## 2020-03-10 LAB — ECHOCARDIOGRAM COMPLETE
Area-P 1/2: 2.56 cm2
Height: 62 in
S' Lateral: 1.6 cm
Weight: 1472 oz

## 2020-03-10 LAB — MAGNESIUM: Magnesium: 1.5 mg/dL — ABNORMAL LOW (ref 1.7–2.4)

## 2020-03-10 LAB — PHOSPHORUS: Phosphorus: 3 mg/dL (ref 2.5–4.6)

## 2020-03-10 LAB — GLUCOSE, CAPILLARY: Glucose-Capillary: 73 mg/dL (ref 70–99)

## 2020-03-10 SURGERY — ESOPHAGOGASTRODUODENOSCOPY (EGD) WITH PROPOFOL
Anesthesia: Monitor Anesthesia Care

## 2020-03-10 MED ORDER — THIAMINE HCL 100 MG/ML IJ SOLN
100.0000 mg | Freq: Every day | INTRAMUSCULAR | Status: DC
  Administered 2020-03-10 – 2020-03-11 (×2): 100 mg via INTRAVENOUS
  Filled 2020-03-10 (×2): qty 2

## 2020-03-10 MED ORDER — PHENYLEPHRINE 40 MCG/ML (10ML) SYRINGE FOR IV PUSH (FOR BLOOD PRESSURE SUPPORT): PREFILLED_SYRINGE | INTRAVENOUS | Status: DC | PRN

## 2020-03-10 MED ORDER — SODIUM CHLORIDE 0.9 % IV SOLN: INTRAVENOUS | Status: DC

## 2020-03-10 MED ORDER — MAGNESIUM SULFATE 4 GM/100ML IV SOLN
4.0000 g | Freq: Once | INTRAVENOUS | Status: AC
  Administered 2020-03-10: 4 g via INTRAVENOUS
  Filled 2020-03-10: qty 100

## 2020-03-10 MED ORDER — CHLORHEXIDINE GLUCONATE CLOTH 2 % EX PADS
6.0000 | MEDICATED_PAD | Freq: Every day | CUTANEOUS | Status: DC
  Administered 2020-03-10 – 2020-03-12 (×3): 6 via TOPICAL

## 2020-03-10 MED ORDER — MORPHINE SULFATE (PF) 2 MG/ML IV SOLN
INTRAVENOUS | Status: AC
  Administered 2020-03-10: 2 mg via INTRAVENOUS
  Filled 2020-03-10: qty 1

## 2020-03-10 MED ORDER — PROPOFOL 10 MG/ML IV BOLUS
INTRAVENOUS | Status: DC | PRN
  Administered 2020-03-10 (×3): 20 mg via INTRAVENOUS

## 2020-03-10 MED ORDER — POTASSIUM CHLORIDE 10 MEQ/50ML IV SOLN
10.0000 meq | INTRAVENOUS | Status: AC
  Administered 2020-03-10 (×4): 10 meq via INTRAVENOUS
  Filled 2020-03-10 (×4): qty 50

## 2020-03-10 MED ORDER — MORPHINE SULFATE (PF) 2 MG/ML IV SOLN
2.0000 mg | Freq: Once | INTRAVENOUS | Status: AC
Start: 2020-03-10 — End: 2020-03-10

## 2020-03-10 MED ORDER — PROPOFOL 10 MG/ML IV BOLUS: INTRAVENOUS | Status: DC | PRN

## 2020-03-10 MED ORDER — DEXTROSE IN LACTATED RINGERS 5 % IV SOLN: INTRAVENOUS | Status: DC

## 2020-03-10 MED ORDER — FOLIC ACID 5 MG/ML IJ SOLN
1.0000 mg | Freq: Every day | INTRAMUSCULAR | Status: DC
  Administered 2020-03-10 – 2020-03-11 (×2): 1 mg via INTRAVENOUS
  Filled 2020-03-10 (×4): qty 0.2

## 2020-03-10 MED ORDER — ADULT MULTIVITAMIN W/MINERALS CH: 1.0000 | ORAL_TABLET | Freq: Every day | ORAL | Status: DC

## 2020-03-10 MED ORDER — PROPOFOL 500 MG/50ML IV EMUL
INTRAVENOUS | Status: DC | PRN
Start: 2020-03-10 — End: 2020-03-10
  Administered 2020-03-10: 100 ug/kg/min via INTRAVENOUS

## 2020-03-10 MED ORDER — PHENYLEPHRINE 40 MCG/ML (10ML) SYRINGE FOR IV PUSH (FOR BLOOD PRESSURE SUPPORT)
PREFILLED_SYRINGE | INTRAVENOUS | Status: DC | PRN
  Administered 2020-03-10: 40 ug via INTRAVENOUS
  Administered 2020-03-10: 80 ug via INTRAVENOUS

## 2020-03-10 MED ORDER — SODIUM CHLORIDE 0.9% FLUSH: 10.0000 mL | INTRAVENOUS | Status: DC | PRN

## 2020-03-10 MED ORDER — LACTATED RINGERS IV SOLN: INTRAVENOUS | Status: DC | PRN

## 2020-03-10 MED ORDER — EPINEPHRINE 1 MG/10ML IJ SOSY
PREFILLED_SYRINGE | INTRAMUSCULAR | Status: AC
  Filled 2020-03-10: qty 10

## 2020-03-10 MED ORDER — SODIUM CHLORIDE 0.9% FLUSH
10.0000 mL | Freq: Two times a day (BID) | INTRAVENOUS | Status: DC
  Administered 2020-03-11 – 2020-03-13 (×5): 10 mL

## 2020-03-10 SURGICAL SUPPLY — 15 items

## 2020-03-10 NOTE — Progress Notes (Signed)
Michelle Schneider called. Pt stated that I am ok to speak and give information about her to the friend. Michelle Schneider stated patient does not have any PCP, dr. Adela Schneider is her GI doctor. She also had craniotomy a year ago, and "it getting worse" Pt does not eat much and drinks alcohol. They found bottles of empty wine at her house. Pt lives with disable brother. She also said, pt "lies".  I asked pt about her drinking, she said "I drink just here and there, but definitely will cut it down". MD notified about this situation. See new orders.

## 2020-03-10 NOTE — Transfer of Care (Signed)
Immediate Anesthesia Transfer of Care Note  Patient: Michelle Schneider  Procedure(s) Performed: ESOPHAGOGASTRODUODENOSCOPY (EGD) WITH PROPOFOL (N/A ) HEMOSTASIS CLIP PLACEMENT ESOPHAGEAL BANDING  Patient Location: Endoscopy Unit  Anesthesia Type:MAC  Level of Consciousness: awake, alert  and oriented  Airway & Oxygen Therapy: Patient Spontanous Breathing and Patient connected to nasal cannula oxygen  Post-op Assessment: Report given to RN and Post -op Vital signs reviewed and stable  Post vital signs: Reviewed and stable  Last Vitals:  Vitals Value Taken Time  BP    Temp    Pulse 84 03/10/20 0941  Resp 15 03/10/20 0941  SpO2 90 % 03/10/20 0941  Vitals shown include unvalidated device data.  Last Pain:  Vitals:   03/10/20 0820  TempSrc: Oral  PainSc: 0-No pain         Complications: No complications documented.

## 2020-03-10 NOTE — Interval H&P Note (Signed)
History and Physical Interval Note:  03/10/2020 8:29 AM  Michelle Schneider  has presented today for surgery, with the diagnosis of GI bleed.  The various methods of treatment have been discussed with the patient and family. After consideration of risks, benefits and other options for treatment, the patient has consented to  Procedure(s): ESOPHAGOGASTRODUODENOSCOPY (EGD) WITH PROPOFOL (N/A) as a surgical intervention.  The patient's history has been reviewed, patient examined, no change in status, stable for surgery.  I have reviewed the patient's chart and labs.  Questions were answered to the patient's satisfaction.     Viviann Spare P Corrin Sieling

## 2020-03-10 NOTE — Anesthesia Postprocedure Evaluation (Signed)
Anesthesia Post Note  Patient: GRABIELA WOHLFORD  Procedure(s) Performed: ESOPHAGOGASTRODUODENOSCOPY (EGD) WITH PROPOFOL (N/A ) HEMOSTASIS CLIP PLACEMENT ESOPHAGEAL BANDING     Patient location during evaluation: PACU Anesthesia Type: MAC Level of consciousness: awake and alert Pain management: pain level controlled Vital Signs Assessment: post-procedure vital signs reviewed and stable Respiratory status: spontaneous breathing, nonlabored ventilation, respiratory function stable and patient connected to nasal cannula oxygen Cardiovascular status: stable and blood pressure returned to baseline Postop Assessment: no apparent nausea or vomiting Anesthetic complications: no   No complications documented.  Last Vitals:  Vitals:   03/10/20 0948 03/10/20 0957  BP: 102/86 100/72  Pulse: 82 85  Resp: 18 18  Temp: 37.1 C   SpO2: 90% 90%    Last Pain:  Vitals:   03/10/20 0957  TempSrc:   PainSc: 0-No pain                 Terah Robey DAVID

## 2020-03-10 NOTE — Op Note (Signed)
[facility] Patient Name: Michelle Schneider Procedure Date : 03/10/2020 MRN: [id number] Attending MD: Willaim Rayas. Adela Lank , MD Date of Birth: 1960/07/19 CSN: [id number] Age: 59 Admit Type: Inpatient Procedure:                Upper GI endoscopy Indications:              Hematemesis, history of decompensated cirrhosis                            from autoimmune hepatitis and small varices 2019,                            admission platelets of 19, now 48 post transfusion Providers:                Viviann Spare P. Adela Lank, MD, Rogue Jury, RN,                            Lawson Radar, Technician, Dairl Ponder, CRNA Referring MD:              Medicines:                Monitored Anesthesia Care Complications:            No immediate complications. Estimated blood loss:                            Minimal. Estimated Blood Loss:     Estimated blood loss was minimal. Procedure:                Pre-Anesthesia Assessment:                           - Prior to the procedure, a History and Physical                            was performed, and patient medications and                            allergies were reviewed. The patient's tolerance of                            previous anesthesia was also reviewed. The risks                            and benefits of the procedure and the sedation                            options and risks were discussed with the patient.                            All questions were answered, and informed consent                            was obtained. Prior Anticoagulants: The patient has  taken no previous anticoagulant or antiplatelet                            agents. ASA Grade Assessment: III - A patient with                            severe systemic disease. After reviewing the risks                            and benefits, the patient was deemed in                            satisfactory condition to undergo the procedure.                            After obtaining informed consent, the endoscope was                            passed under direct vision. Throughout the                            procedure, the patient's blood pressure, pulse, and                            oxygen saturations were monitored continuously. The                            [id number]-[id number] ([id number]) Olympus gastroscope was                            introduced through the mouth, and advanced to the                            second part of duodenum. The upper GI endoscopy was                            accomplished without difficulty. The patient                            tolerated the procedure well. Scope In: Scope Out: Findings:      Esophagogastric landmarks were identified: the Z-line was found at 37       cm, the gastroesophageal junction was found at 37 cm and the upper       extent of the gastric folds was found at 39 cm from the incisors. 2cm       hiatal hernia      Red blood was noted in the esophagus upon entry.      Following lavage of the esophagus, a localized area of active bleeding       was found at the gastroesophageal junction, just inferior to it. The       area was very slightly nodular, with focal bleeding, not sure if this       was a dieulafoy or mallory weiss tear? Did not appear to be on an  extension from a varix and the mucosa in the area was flat, but there       were columns of varices within 2cm or so from the site, so possible it       is related. For hemostasis, two hemostatic clips were successfully       placed across the area with good result.      Medium sized varices were found in the middle third of the esophagus and       in the lower third of the esophagus. None of the varices had stigmata or       bleeding or red markings. However, they have clearly increased in size       since the last exam, she does not tolerate propranolol, and in case the       bleeding site at GEJ was related to the  varices / the area described       above was fed by the varices, three bands were successfully placed       resulting in deflation of varices. Two of the bands were placed within       2cm or so proximal from the area that was clipped. Of note, patient has       small posterior pharynx, bander was difficult to traverse UES and enter       the esophagus, with superficial mucosal trauma noted in the setting of       thrombocytopenia      The exam of the esophagus was otherwise normal.      Red blood and clot was found in the entire examined stomach. A       therapeutic endoscope was used to lavage and clear the clot burden.      The exam of the stomach was otherwise normal. No gastric varices.      The duodenal bulb and second portion of the duodenum were normal other       than blood in the lumen which was lavaged. Impression:               - Red blood in the esophagus                           - Abnormal mucosa at GEJ as described above                            actively bleeding, appearance not typical for                            variceal bleeding, but along with moderate sized                            esophageal varices in close approximation. 2                            hemostasis clips placed and 3 bands placed on                            varices                           - Small hiatal hernia                           -  Red blood in the stomach, lavaged with                            therapeutic upper endoscope                           - No gastric varices or other pathology in the                            stomach to cause bleeding                           - Normal duodenum Recommendation:           - Return patient to ICU for ongoing care.                           - NPO                           - Continue present meds - octreotide drip /                            protonix drip, ceftriaxone                           - Monitor for recurrent bleeding, trend Hgb /                             platelets                           - GI service will continue to follow Procedure Code(s):        --- Professional ---                           [id number], Esophagogastroduodenoscopy, flexible,                            transoral; with band ligation of esophageal/gastric                            varices                           43255, 59, Esophagogastroduodenoscopy, flexible,                            transoral; with control of bleeding, any method Diagnosis Code(s):        --- Professional ---                           K22.8, Other specified diseases of esophagus                           I85.00, Esophageal varices without bleeding  K92.2, Gastrointestinal hemorrhage, unspecified                           K92.0, Hematemesis CPT copyright 2019 American Medical Association. All rights reserved. The codes documented in this report are preliminary and upon coder review may  be revised to meet current compliance requirements. Viviann Spare P. Adela Lank, MD 03/10/2020 10:26:25 AM This report has been signed electronically. Number of Addenda: 0

## 2020-03-10 NOTE — Progress Notes (Signed)
Pt with K+ 3.5 with creat 0.75 and GFR >60, ELink CCM electrolyte protocol initiated.

## 2020-03-10 NOTE — H&P (View-Only) (Signed)
Consultation  Referring Provider:     Jennette Kettle Primary Care Physician:  Patient, No Pcp Per Primary Gastroenterologist:     Frances Furbish   Reason for Consultation:     GI bleed         HPI:   BRYLEA PITA is a 59 y.o. female well-known to me with a history of autoimmune hepatitis and associated cirrhosis.  I have not seen her since April 2019.  She was diagnosed in June 2017 when she presented with ascites, anasarca, and jaundice.  She has previously had elevated serologies for autoimmune hepatitis and had a transjugular liver biopsy consistent with that at time of diagnosis.  At time of diagnosis she was treated with prednisone.  Initially was treated with azathioprine 50 mg a day as well, this was eventually stopped however she had alopecia and did not tolerate the thiopurine.  She was eventually able to see a hepatologist in twenty nineteen who recommended observation as her liver enzymes were stable at the time.  Her last EGD with me was the last time I saw her, April 2019, she had trace varices.  She had been tried on propranolol in the past but has not tolerated it so we had planned for surveillance endoscopy follow-up.  Unfortunately she has not followed up with me in the past few years.  She states she has been in her usual state of health in recent years.  She is not taking any medications for her liver disease.  She states she is treating this with " essential oils". Her medical history was complicated by a subdural hematoma for which she had surgery in October 2020 and she got through that okay.  Yesterday she developed melena initially.  She states she is never had that before.  She then developed hematemesis, a few episodes of vomiting frankly red blood and clots.  In the ED this was witnessed.  She denies any NSAID use.  She denies any abdominal pain or chest pain.  Initially her hemoglobin was 8.1, platelet count of nineteen and INR 1.9.  Given the volume of blood  noted in the ED she was given 2 units of packed red blood cells as well as a unit of platelets.  Her platelets are currently forty-eight this morning.  Last episode of hematemesis was small volume at 2 AM per nursing.  She has had a few episodes of melena since she has been here overnight.  This morning she states she is feeling better.  She has been n.p.o., has not eaten anything since yesterday.  Of note her bilirubin is 9.9, ALT forty-six, AST one fifty-five, albumin 2.5, alk phos forty-one.  Back in October 2020 her bilirubin was in the fours.  She denies problems with ascites or encephalopathy previously.  Again she has not followed up with [place] for a few years.  She does not drink much alcohol.  She lives by herself.   Past Medical History:  Diagnosis Date  . Autoimmune hepatitis ([place])   . Cirrhosis ([place])   . Esophageal varices ([place])   . Family history of adverse reaction to anesthesia    cousin had a difficult time waking up after appendectomy  . Heart murmur    "diagnosed with a very slight heart murmur in 2001 but no other doctor has been able to hear it"  . Hyperlipidemia   . Raynaud's disease     Past Surgical History:  Procedure Laterality Date  . ABDOMINAL  HYSTERECTOMY    . CRANIOTOMY Left 12/22/2018   Procedure: CRANIOTOMY HEMATOMA EVACUATION SUBDURAL;  Surgeon: Erline Levine, MD;  Location: [facility];  Service: Neurosurgery;  Laterality: Left;  . ESOPHAGOGASTRODUODENOSCOPY (EGD) WITH PROPOFOL N/A 06/15/2017   Procedure: ESOPHAGOGASTRODUODENOSCOPY (EGD) WITH PROPOFOL;  Surgeon: Yetta Flock, MD;  Location: [facility];  Service: Gastroenterology;  Laterality: N/A;  . ORIF HUMERUS FRACTURE Right 05/23/2015   Procedure: OPEN REDUCTION INTERNAL FIXATION (ORIF) RIGHT  PROXIMAL HUMERUS FRACTURE;  Surgeon: Justice Britain, MD;  Location: [facility];  Service: Orthopedics;  Laterality: Right;  . WISDOM TOOTH EXTRACTION      Family History  Problem Relation Age of Onset  . Dementia  Father   . Parkinson's disease Mother   . Stroke Brother   . Colon cancer Neg Hx   . Esophageal cancer Neg Hx   . Pancreatic cancer Neg Hx   . Rectal cancer Neg Hx   . Stomach cancer Neg Hx      Social History   Tobacco Use  . Smoking status: Never Smoker  . Smokeless tobacco: Never Used  Vaping Use  . Vaping Use: Never used  Substance Use Topics  . Alcohol use: Yes    Alcohol/week: 0.0 standard drinks    Comment: occasional  . Drug use: No    Prior to Admission medications   Medication Sig Start Date End Date Taking? Authorizing Provider  Lavender Oil OIL Apply 1 application topically 2 (two) times daily.   Yes [provider]  Lemon Oil OIL Take 6 drops by mouth daily.   Yes [provider]  NON FORMULARY Place 2 drops under the tongue daily. CBD Oil Doterra   Yes [provider]  ORANGE PO Take 5 drops by mouth daily.   Yes [provider]  OVER THE COUNTER MEDICATION Take 3 tablets by mouth 2 (two) times daily. Doterra Lifelong Vitality Pack   Yes [provider]  OVER THE COUNTER MEDICATION Apply 1 application topically 2 (two) times daily as needed (essential oil supplement for digestive).   Yes [provider]    Current Facility-Administered Medications  Medication Dose Route Frequency Provider Last Rate Last Admin  . 0.9 %  sodium chloride infusion  250 mL Intravenous Continuous Tyna Jaksch, MD      . 0.9 %  sodium chloride infusion   Intravenous Continuous Tyna Jaksch, MD   Stopped at 03/10/20 [phone]  . cefTRIAXone (ROCEPHIN) 1 g in sodium chloride 0.9 % 100 mL IVPB  1 g Intravenous Q24H Etta Quill, DO      . Chlorhexidine Gluconate Cloth 2 % PADS 6 each  6 each Topical Daily Tyna Jaksch, MD      . docusate sodium (COLACE) capsule 100 mg  100 mg Oral BID PRN Tyna Jaksch, MD      . morphine 2 MG/ML injection 2 mg  2 mg Intravenous Once Tyna Jaksch, MD      .  norepinephrine (LEVOPHED) 35m in 255mpremix infusion  2-10 mcg/min Intravenous Titrated IzTyna JakschMD   Stopped at 03/10/20 [phone]. octreotide (SANDOSTATIN) 500 mcg in sodium chloride 0.9 % 250 mL (2 mcg/mL) infusion  50 mcg/hr Intravenous Continuous FlDeno EtienneDO 25 mL/hr at 03/10/20 0600 50 mcg/hr at 03/10/20 0600  . ondansetron (ZOFRAN) injection 4 mg  4 mg Intravenous Q8H PRN IzTyna JakschMD   4 mg at 03/10/20 0013  . pantoprazole (PROTONIX) 80 mg  in sodium chloride 0.9 % 100 mL (0.8 mg/mL) infusion  8 mg/hr Intravenous Continuous Deno Etienne, DO 10 mL/hr at 03/10/20 0600 8 mg/hr at 03/10/20 0600  . [START ON 03/13/2020] pantoprazole (PROTONIX) injection 40 mg  40 mg Intravenous Q12H Floyd, Dan, DO      . polyethylene glycol (MIRALAX / GLYCOLAX) packet 17 g  17 g Oral Daily PRN Tyna Jaksch, MD      . potassium chloride 10 mEq in 50 mL *CENTRAL LINE* IVPB  10 mEq Intravenous Q1 Hr x 4 Margaretmary Lombard, MD 50 mL/hr at 03/10/20 0633 10 mEq at 03/10/20 2725  . sodium chloride flush (NS) 0.9 % injection 10-40 mL  10-40 mL Intracatheter Q12H Tyna Jaksch, MD      . sodium chloride flush (NS) 0.9 % injection 10-40 mL  10-40 mL Intracatheter PRN Tyna Jaksch, MD        Allergies as of 03/09/2020 - Review Complete 03/09/2020  Allergen Reaction Noted  . Oxycodone-acetaminophen Nausea And Vomiting and Other (See Comments) 05/17/2015     Review of Systems:    As per HPI, otherwise negative    Physical Exam:  Vital signs in last 24 hours: Temp:  [98.2 F (36.8 C)-98.5 F (36.9 C)] 98.3 F (36.8 C) (12/26 0310) Pulse Rate:  [79-137] 81 (12/26 0630) Resp:  [11-25] 11 (12/26 0630) BP: (81-136)/(49-94) 102/74 (12/26 0600) SpO2:  [96 %-100 %] 97 % (12/26 0630) Arterial Line BP: (95-164)/(53-77) 110/54 (12/26 0630) Weight:  [41.7 kg-42.6 kg] 42.6 kg (12/26 0500) Last BM Date: 03/10/20 General:   Pleasant female in NAD Head:  Normocephalic and  atraumatic. Eyes:   (+) icteric sclera.   Conjunctiva pale. Ears:  Normal auditory acuity. Neck:  Supple Lungs:  Respirations even and unlabored.  Heart:  Regular rate and rhythm; Abdomen:  Soft, nondistended, nontender.  Rectal:  Not performed.  Msk:  Symmetrical without gross deformities.  Extremities:  Without edema. Neurologic:  Alert and  oriented x4;  grossly normal neurologically. Skin:  Intact without significant lesions or rashes. Psych:  Alert and cooperative. Normal affect.  LAB RESULTS: Recent Labs    03/09/20 1748 03/09/20 1802 03/09/20 2256 03/10/20 0154 03/10/20 0521  WBC 4.4  --  6.0  --  4.5  HGB 8.1*   < > 5.3* 9.3* 8.6*  HCT 24.5*   < > 16.7* 27.9* 24.0*  PLT 19*  --  95*  --  48*   < > = values in this interval not displayed.   BMET Recent Labs    03/09/20 1748 03/09/20 1802 03/10/20 0154 03/10/20 0521  NA 135 136 139 138  K 3.0* 3.1* 3.5 3.8  CL 95* 95* 103 103  CO2 20*  --  18* 23  GLUCOSE 108* 103* 115* 127*  BUN 19 20 20 19   CREATININE 0.77 0.30* 0.75 0.69  CALCIUM 8.9  --  8.1* 8.1*   LFT Recent Labs    03/10/20 0521  PROT 5.0*  ALBUMIN 2.5*  AST 155*  ALT 46*  ALKPHOS 41  BILITOT 9.9*   PT/INR Recent Labs    03/09/20 1748  LABPROT 20.8*  INR 1.9*    STUDIES: DG CHEST PORT 1 VIEW  Result Date: 03/09/2020 CLINICAL DATA:  Vomiting blood with dizziness and weakness. EXAM: PORTABLE CHEST 1 VIEW COMPARISON:  December 22, 2018 FINDINGS: A right internal jugular venous catheter is seen. Its distal tip extends laterally across the right apex and sits along the  right axilla. There is no evidence of acute infiltrate, pleural effusion or pneumothorax. The heart size and mediastinal contours are within normal limits. The visualized skeletal structures are unremarkable. IMPRESSION: 1. Malpositioned right internal jugular venous catheter, without evidence of acute cardiopulmonary disease. Electronically Signed   By: Virgina Norfolk M.D.    On: 03/09/2020 23:16       Impression / Plan:   59 year old female with a history of autoimmune hepatitis and associated cirrhosis, history of trace varices in twenty nineteen, history of anasarca and ascites, has not followed up with our office in almost 2 and half years, presenting with significant upper GI bleed.  I am concerned this is very likely bleeding from esophageal varices, while I discussed other causes of bleeding and differential diagnosis with her.  She is also decompensated with jaundice, she needs [place] screening with an ultrasound at some point during her course here.  Acutely she was transfused with blood and platelets overnight, platelets improved and currently in the 40s.  On octreotide drip as well as IV Protonix.  She appears stable for endoscopy at this time.  Upper endoscopy is recommended to further evaluate and treat.  I discussed what this is with her, risk benefits of endoscopy and anesthesia.  Risks include bleeding, perforation, inability to control bleeding, etc. she wants to proceed.  Of note, she has not tolerated trial of propranolol in the past for treatment of varices and if she has variceal bleeding will need serial banding treatments moving forward and close follow-up:  Plan: - N.p.o. - Continue empiric IV octreotide and Protonix - Serial hemoglobin, transfuse to keep hemoglobin greater than seven.  We will monitor platelet count, currently acceptable for endoscopy - Plan for endoscopy this morning as above - Continue ceftriaxone for SBP prophylaxis - Patient will benefit from [facility] screening with an ultrasound and AFP while she is inpatient at some point during her course given her jaundice  Please call with changes in her status in interim, hopefully endoscopy in a few hours this morning.  Macon Cellar, MD [facility] Gastroenterology

## 2020-03-10 NOTE — Progress Notes (Signed)
   According to the secretary of the unit 3100 the patient DPOA just called.  Her name is Michelle Schneider.  Apparently patient was found in the fetal position with blood all over the room.  Plenty of bottles of alcohol in the house and in the room.  In addition human and dog feces all over.  DPOA thinks patient needs psych consult  Plan -This can be addressed during the day in the morning hours of 03/11/2020      SIGNATURE    Dr. Kalman Shan, M.D., F.C.C.P,  Pulmonary and Critical Care Medicine Staff Physician, [facility] Director - Interstitial Lung Disease  Program  Pulmonary Fibrosis [facility] at [facility] [place], [place], [address]  Pager: [phone], If no answer  OR between  19:00-7:00h: page [phone] Telephone (clinical office): [phone] Telephone (research): [phone]  5:21 PM 03/10/2020

## 2020-03-10 NOTE — Anesthesia Preprocedure Evaluation (Signed)
Anesthesia Evaluation  Patient identified by MRN, date of birth, ID band Patient awake    Reviewed: Allergy & Precautions, NPO status , Patient's Chart, lab work & pertinent test results  Airway Mallampati: I  TM Distance: >3 FB Neck ROM: Full    Dental   Pulmonary    Pulmonary exam normal        Cardiovascular Normal cardiovascular exam+ Valvular Problems/Murmurs      Neuro/Psych    GI/Hepatic   Endo/Other    Renal/GU      Musculoskeletal   Abdominal   Peds  Hematology   Anesthesia Other Findings   Reproductive/Obstetrics                             Anesthesia Physical Anesthesia Plan  ASA: II  Anesthesia Plan: MAC   Post-op Pain Management:    Induction: Intravenous  PONV Risk Score and Plan: 2  Airway Management Planned: Nasal Cannula  Additional Equipment:   Intra-op Plan:   Post-operative Plan:   Informed Consent: I have reviewed the patients History and Physical, chart, labs and discussed the procedure including the risks, benefits and alternatives for the proposed anesthesia with the patient or authorized representative who has indicated his/her understanding and acceptance.       Plan Discussed with: CRNA and Surgeon  Anesthesia Plan Comments:         Anesthesia Quick Evaluation

## 2020-03-10 NOTE — Progress Notes (Signed)
CRITICAL VALUE ALERT  Critical Value:  Lactic acid 8.5  Date & Time Notied:  03/10/20 @ 0045  Provider Notified: Dr Mikki Harbor  Orders Received/Actions taken: MD on floor and made aware

## 2020-03-10 NOTE — Progress Notes (Signed)
*  PRELIMINARY RESULTS* Echocardiogram 2D Echocardiogram has been performed.  Stacey Drain 03/10/2020, 5:13 PM

## 2020-03-10 NOTE — Consult Note (Signed)
Consultation  Referring Provider:     Jennette Kettle Primary Care Physician:  Patient, No Pcp Per Primary Gastroenterologist:     Frances Furbish   Reason for Consultation:     GI bleed         HPI:   Michelle Schneider is a 59 y.o. female well-known to me with a history of autoimmune hepatitis and associated cirrhosis.  I have not seen her since April 2019.  She was diagnosed in June 2017 when she presented with ascites, anasarca, and jaundice.  She has previously had elevated serologies for autoimmune hepatitis and had a transjugular liver biopsy consistent with that at time of diagnosis.  At time of diagnosis she was treated with prednisone.  Initially was treated with azathioprine 50 mg a day as well, this was eventually stopped however she had alopecia and did not tolerate the thiopurine.  She was eventually able to see a hepatologist in twenty nineteen who recommended observation as her liver enzymes were stable at the time.  Her last EGD with me was the last time I saw her, April 2019, she had trace varices.  She had been tried on propranolol in the past but has not tolerated it so we had planned for surveillance endoscopy follow-up.  Unfortunately she has not followed up with me in the past few years.  She states she has been in her usual state of health in recent years.  She is not taking any medications for her liver disease.  She states she is treating this with " essential oils". Her medical history was complicated by a subdural hematoma for which she had surgery in October 2020 and she got through that okay.  Yesterday she developed melena initially.  She states she is never had that before.  She then developed hematemesis, a few episodes of vomiting frankly red blood and clots.  In the ED this was witnessed.  She denies any NSAID use.  She denies any abdominal pain or chest pain.  Initially her hemoglobin was 8.1, platelet count of nineteen and INR 1.9.  Given the volume of blood  noted in the ED she was given 2 units of packed red blood cells as well as a unit of platelets.  Her platelets are currently forty-eight this morning.  Last episode of hematemesis was small volume at 2 AM per nursing.  She has had a few episodes of melena since she has been here overnight.  This morning she states she is feeling better.  She has been n.p.o., has not eaten anything since yesterday.  Of note her bilirubin is 9.9, ALT forty-six, AST one fifty-five, albumin 2.5, alk phos forty-one.  Back in October 2020 her bilirubin was in the fours.  She denies problems with ascites or encephalopathy previously.  Again she has not followed up with [place] for a few years.  She does not drink much alcohol.  She lives by herself.   Past Medical History:  Diagnosis Date  . Autoimmune hepatitis ([place])   . Cirrhosis ([place])   . Esophageal varices ([place])   . Family history of adverse reaction to anesthesia    cousin had a difficult time waking up after appendectomy  . Heart murmur    "diagnosed with a very slight heart murmur in 2001 but no other doctor has been able to hear it"  . Hyperlipidemia   . Raynaud's disease     Past Surgical History:  Procedure Laterality Date  . ABDOMINAL  HYSTERECTOMY    . CRANIOTOMY Left 12/22/2018   Procedure: CRANIOTOMY HEMATOMA EVACUATION SUBDURAL;  Surgeon: Erline Levine, MD;  Location: [facility];  Service: Neurosurgery;  Laterality: Left;  . ESOPHAGOGASTRODUODENOSCOPY (EGD) WITH PROPOFOL N/A 06/15/2017   Procedure: ESOPHAGOGASTRODUODENOSCOPY (EGD) WITH PROPOFOL;  Surgeon: Yetta Flock, MD;  Location: [facility];  Service: Gastroenterology;  Laterality: N/A;  . ORIF HUMERUS FRACTURE Right 05/23/2015   Procedure: OPEN REDUCTION INTERNAL FIXATION (ORIF) RIGHT  PROXIMAL HUMERUS FRACTURE;  Surgeon: Justice Britain, MD;  Location: [facility];  Service: Orthopedics;  Laterality: Right;  . WISDOM TOOTH EXTRACTION      Family History  Problem Relation Age of Onset  . Dementia  Father   . Parkinson's disease Mother   . Stroke Brother   . Colon cancer Neg Hx   . Esophageal cancer Neg Hx   . Pancreatic cancer Neg Hx   . Rectal cancer Neg Hx   . Stomach cancer Neg Hx      Social History   Tobacco Use  . Smoking status: Never Smoker  . Smokeless tobacco: Never Used  Vaping Use  . Vaping Use: Never used  Substance Use Topics  . Alcohol use: Yes    Alcohol/week: 0.0 standard drinks    Comment: occasional  . Drug use: No    Prior to Admission medications   Medication Sig Start Date End Date Taking? Authorizing Provider  Lavender Oil OIL Apply 1 application topically 2 (two) times daily.   Yes [provider]  Lemon Oil OIL Take 6 drops by mouth daily.   Yes [provider]  NON FORMULARY Place 2 drops under the tongue daily. CBD Oil Doterra   Yes [provider]  ORANGE PO Take 5 drops by mouth daily.   Yes [provider]  OVER THE COUNTER MEDICATION Take 3 tablets by mouth 2 (two) times daily. Doterra Lifelong Vitality Pack   Yes [provider]  OVER THE COUNTER MEDICATION Apply 1 application topically 2 (two) times daily as needed (essential oil supplement for digestive).   Yes [provider]    Current Facility-Administered Medications  Medication Dose Route Frequency Provider Last Rate Last Admin  . 0.9 %  sodium chloride infusion  250 mL Intravenous Continuous Tyna Jaksch, MD      . 0.9 %  sodium chloride infusion   Intravenous Continuous Tyna Jaksch, MD   Stopped at 03/10/20 [phone]  . cefTRIAXone (ROCEPHIN) 1 g in sodium chloride 0.9 % 100 mL IVPB  1 g Intravenous Q24H Etta Quill, DO      . Chlorhexidine Gluconate Cloth 2 % PADS 6 each  6 each Topical Daily Tyna Jaksch, MD      . docusate sodium (COLACE) capsule 100 mg  100 mg Oral BID PRN Tyna Jaksch, MD      . morphine 2 MG/ML injection 2 mg  2 mg Intravenous Once Tyna Jaksch, MD      .  norepinephrine (LEVOPHED) 60m in 2595mpremix infusion  2-10 mcg/min Intravenous Titrated IzTyna JakschMD   Stopped at 03/10/20 [phone]. octreotide (SANDOSTATIN) 500 mcg in sodium chloride 0.9 % 250 mL (2 mcg/mL) infusion  50 mcg/hr Intravenous Continuous FlDeno EtienneDO 25 mL/hr at 03/10/20 0600 50 mcg/hr at 03/10/20 0600  . ondansetron (ZOFRAN) injection 4 mg  4 mg Intravenous Q8H PRN IzTyna JakschMD   4 mg at 03/10/20 0013  . pantoprazole (PROTONIX) 80 mg  in sodium chloride 0.9 % 100 mL (0.8 mg/mL) infusion  8 mg/hr Intravenous Continuous Deno Etienne, DO 10 mL/hr at 03/10/20 0600 8 mg/hr at 03/10/20 0600  . [START ON 03/13/2020] pantoprazole (PROTONIX) injection 40 mg  40 mg Intravenous Q12H Floyd, Dan, DO      . polyethylene glycol (MIRALAX / GLYCOLAX) packet 17 g  17 g Oral Daily PRN Tyna Jaksch, MD      . potassium chloride 10 mEq in 50 mL *CENTRAL LINE* IVPB  10 mEq Intravenous Q1 Hr x 4 Margaretmary Lombard, MD 50 mL/hr at 03/10/20 0633 10 mEq at 03/10/20 4765  . sodium chloride flush (NS) 0.9 % injection 10-40 mL  10-40 mL Intracatheter Q12H Tyna Jaksch, MD      . sodium chloride flush (NS) 0.9 % injection 10-40 mL  10-40 mL Intracatheter PRN Tyna Jaksch, MD        Allergies as of 03/09/2020 - Review Complete 03/09/2020  Allergen Reaction Noted  . Oxycodone-acetaminophen Nausea And Vomiting and Other (See Comments) 05/17/2015     Review of Systems:    As per HPI, otherwise negative    Physical Exam:  Vital signs in last 24 hours: Temp:  [98.2 F (36.8 C)-98.5 F (36.9 C)] 98.3 F (36.8 C) (12/26 0310) Pulse Rate:  [79-137] 81 (12/26 0630) Resp:  [11-25] 11 (12/26 0630) BP: (81-136)/(49-94) 102/74 (12/26 0600) SpO2:  [96 %-100 %] 97 % (12/26 0630) Arterial Line BP: (95-164)/(53-77) 110/54 (12/26 0630) Weight:  [41.7 kg-42.6 kg] 42.6 kg (12/26 0500) Last BM Date: 03/10/20 General:   Pleasant female in NAD Head:  Normocephalic and  atraumatic. Eyes:   (+) icteric sclera.   Conjunctiva pale. Ears:  Normal auditory acuity. Neck:  Supple Lungs:  Respirations even and unlabored.  Heart:  Regular rate and rhythm; Abdomen:  Soft, nondistended, nontender.  Rectal:  Not performed.  Msk:  Symmetrical without gross deformities.  Extremities:  Without edema. Neurologic:  Alert and  oriented x4;  grossly normal neurologically. Skin:  Intact without significant lesions or rashes. Psych:  Alert and cooperative. Normal affect.  LAB RESULTS: Recent Labs    03/09/20 1748 03/09/20 1802 03/09/20 2256 03/10/20 0154 03/10/20 0521  WBC 4.4  --  6.0  --  4.5  HGB 8.1*   < > 5.3* 9.3* 8.6*  HCT 24.5*   < > 16.7* 27.9* 24.0*  PLT 19*  --  95*  --  48*   < > = values in this interval not displayed.   BMET Recent Labs    03/09/20 1748 03/09/20 1802 03/10/20 0154 03/10/20 0521  NA 135 136 139 138  K 3.0* 3.1* 3.5 3.8  CL 95* 95* 103 103  CO2 20*  --  18* 23  GLUCOSE 108* 103* 115* 127*  BUN 19 20 20 19   CREATININE 0.77 0.30* 0.75 0.69  CALCIUM 8.9  --  8.1* 8.1*   LFT Recent Labs    03/10/20 0521  PROT 5.0*  ALBUMIN 2.5*  AST 155*  ALT 46*  ALKPHOS 41  BILITOT 9.9*   PT/INR Recent Labs    03/09/20 1748  LABPROT 20.8*  INR 1.9*    STUDIES: DG CHEST PORT 1 VIEW  Result Date: 03/09/2020 CLINICAL DATA:  Vomiting blood with dizziness and weakness. EXAM: PORTABLE CHEST 1 VIEW COMPARISON:  December 22, 2018 FINDINGS: A right internal jugular venous catheter is seen. Its distal tip extends laterally across the right apex and sits along the  right axilla. There is no evidence of acute infiltrate, pleural effusion or pneumothorax. The heart size and mediastinal contours are within normal limits. The visualized skeletal structures are unremarkable. IMPRESSION: 1. Malpositioned right internal jugular venous catheter, without evidence of acute cardiopulmonary disease. Electronically Signed   By: Virgina Norfolk M.D.    On: 03/09/2020 23:16       Impression / Plan:   59 year old female with a history of autoimmune hepatitis and associated cirrhosis, history of trace varices in twenty nineteen, history of anasarca and ascites, has not followed up with our office in almost 2 and half years, presenting with significant upper GI bleed.  I am concerned this is very likely bleeding from esophageal varices, while I discussed other causes of bleeding and differential diagnosis with her.  She is also decompensated with jaundice, she needs [place] screening with an ultrasound at some point during her course here.  Acutely she was transfused with blood and platelets overnight, platelets improved and currently in the 40s.  On octreotide drip as well as IV Protonix.  She appears stable for endoscopy at this time.  Upper endoscopy is recommended to further evaluate and treat.  I discussed what this is with her, risk benefits of endoscopy and anesthesia.  Risks include bleeding, perforation, inability to control bleeding, etc. she wants to proceed.  Of note, she has not tolerated trial of propranolol in the past for treatment of varices and if she has variceal bleeding will need serial banding treatments moving forward and close follow-up:  Plan: - N.p.o. - Continue empiric IV octreotide and Protonix - Serial hemoglobin, transfuse to keep hemoglobin greater than seven.  We will monitor platelet count, currently acceptable for endoscopy - Plan for endoscopy this morning as above - Continue ceftriaxone for SBP prophylaxis - Patient will benefit from [facility] screening with an ultrasound and AFP while she is inpatient at some point during her course given her jaundice  Please call with changes in her status in interim, hopefully endoscopy in a few hours this morning.  Midway Cellar, MD [facility]

## 2020-03-10 NOTE — Procedures (Signed)
Central Venous Catheter Insertion Procedure Note  AVELEEN NEVERS  [id number]  1961-03-08  Date:03/10/20  Time:12:50 AM   Provider Performing:Kirrah Mustin E Mikki Harbor   Procedure: Insertion of Non-tunneled Central Venous [phone]) with US guidance ([id number])   Indication(s) Medication administration and Difficult access  Consent Risks of the procedure as well as the alternatives and risks of each were explained to the patient and/or caregiver.  Consent for the procedure was obtained and is signed in the bedside chart  Anesthesia Topical only with 1% lidocaine   Timeout Verified patient identification, verified procedure, site/side was marked, verified correct patient position, special equipment/implants available, medications/allergies/relevant history reviewed, required imaging and test results available.  Sterile Technique Maximal sterile technique including full sterile barrier drape, hand hygiene, sterile gown, sterile gloves, mask, hair covering, sterile ultrasound probe cover (if used).  Procedure Description Area of catheter insertion was cleaned with chlorhexidine and draped in sterile fashion.  With real-time ultrasound guidance a central venous catheter was placed into the left femoral vein. Nonpulsatile blood flow and easy flushing noted in all ports.  The catheter was sutured in place and sterile dressing applied.  Complications/Tolerance None; patient tolerated the procedure well. Chest X-ray is ordered to verify placement for internal jugular or subclavian cannulation.   Chest x-ray is not ordered for femoral cannulation.  EBL Minimal  Specimen(s) None

## 2020-03-10 NOTE — Progress Notes (Signed)
NAME:  Michelle Schneider, MRN:  [id number], DOB:  [birth year], LOS: 1 ADMISSION DATE:  03/09/2020, CONSULTATION DATE:  03/09/20 REFERRING MD:  Adela Lank, CHIEF COMPLAINT:  hematemesis  Brief History:  59 yo w hx of autoimmune hepatitis presents with hematemesis and melena.  History of Present Illness:  This is a 59 yo female with history of autoimmune hepatitis, cirrhosis and varices who presented with melena x4 to the ED. She has been having nausea and vomiting. Some of the vomit just had blood mixed in and other times it pure blood. She notes that she has had black tarry stools x 1 day as well. She has never had this before. No NSAID use. No history of Hpylori or ulcers. She has not been exposed to any Covid. No SOB or fevers. Is not vaccinated.   In ED she had one more bout of hematemesis. Was estimated by staff to be about 200 cc's and be Frankly bloody. In ED patient noted to have Hypotension that was persistent.  Additionally patient had labs that were notable for hemoglobin of 8.1 which is down from 9.3 previously platelet count of 19 and an INR of 1.9.  2 units of blood were ordered by the hospitalist team as well as platelets.  GI was consulted in the ED and recommended supportive therapy unless patient should decompensate abruptly and require intubation.   Of note patient with history of autoimmune hepatitis that was diagnosed in 2017 she had a positive ANA IgG and smooth muscle antibodies additionally she had a biopsy that was consistent with autoimmune hepatitis. she had been on prednisone at one point.  Was transitioned to azathioprine.  Patient did not tolerate the azathioprine.  She felt she was more fatigued with that and was losing hair.  She had self tapered off of prednisone and had discontinued Imuran. There is documentation that patient was using essential oils as well.   Patient tells me that she has not been on any immunosuppressive medications.  Not taking any prednisone.  Not  having taken any Imuran.  Not following up with any GI docs.  She has not had any recent endoscopies or colonoscopies.  She is not take any other medications for any other medical conditions at this time.   EGD 11/14/2015 - small varices [place] - 03/01/2017 - cirrhotic changes without mass lesions  Past Medical History:  Autoimmune hepatitis Cirrhosis varices   Significant Hospital Events:  Admitted to PCCM on 03-09-2020  Consults:  PCCM  Procedures:  Central line and A-line placement on 03-09-2020  Significant Diagnostic Tests:  Echocardiogram  Micro Data:  Cultures pending  Antimicrobials:  Received ceftriaxone in the ED for potential SBP  Interim History / Subjective:   03/10/2020 -status post endoscopy by Dr. Adela Lank.  Findings of red blood and clot found in the esophagus and entire stomach status post lavage.  No gastric varices.  Duodenal bulb and second portion of duodenum were normal.  However there is active bleeding at the GE J junction with associated moderate size esophageal varices close to bleeding location.  Status post 2 hemostasis clips and 3 bands placed on on the varices.  Small hiatal hernia present.  Since return from endoscopy -no active bleeding.  Patient's not on pressors she is awake and alert.  N.p.o. ordered through 03/11/20 has been placed by GI.  She is on octreotide drip and Protonix drip.  Ceftriaxone for spontaneous bacterial peritonitis prophylaxis has been recommended.  Hepatocellular carcinoma screening has been recommended. Objective  Blood pressure 117/61, pulse 87, temperature 98.1 F (36.7 C), temperature source Oral, resp. rate 19, height 5\' 2"  (1.575 m), weight 41.7 kg, SpO2 95 %.        Intake/Output Summary (Last 24 hours) at 03/10/2020 1156 Last data filed at 03/10/2020 1125 Gross per 24 hour  Intake 3476.72 ml  Output 500 ml  Net 2976.72 ml   Filed Weights   03/10/20 0000 03/10/20 0500 03/10/20 0820  Weight: 42.6 kg 42.6  kg 41.7 kg    Examination: Very frail cachectic female.  Thin and lean body mass index.  Jaundiced eyes.  Alert and oriented x3.  Lying in the bed in ICU bed 3M03.  Abdomen soft.  Clear to auscultation bilaterally.  Not confused.  No sinus no clubbing no edema   Resolved Hospital Problem list   Not applicable  Assessment & Plan:  This is a 59 year old female with history as noted above who presents for chief complaint of hematemesis.  On review of systems patient noted to have melena.   Hematemesis-most likely related to known esophageal varices versus separate lesion in the GE junction.  Status post clipping and banding 03/10/2020 by GI.   Plan -Octreotide infusion -Protonix infusion -CBC every every 6 hours -Blood cultures  -UA -Ceftriaxone daily for SBP prophylaxis -Continue to follow recommendations from GI as they have been consulted and made aware patient in the ED -Maintain patient n.p.o -Antiemetics as needed with QTC monitoring  Shock-likely hemorrhagic and distributive from liver failure  03/10/2020: Resolved  Plan -Await  echo in a.m. -Levo as needed -Maintain MAP of 65 and above   Hemorrhagic anemia  -03/10/2020: Appears to have resolved after GI procedure  Plan - - PRBC for hgb </= 6.9gm%    - exceptions are   -  if ACS susepcted/confirmed then transfuse for hgb </= 8.0gm%,  or    -  active bleeding with hemodynamic instability, then transfuse regardless of hemoglobin value   At at all times try to transfuse 1 unit prbc as possible with exception of active hemorrhage   Lactic acidosis secondary to the above  03/10/2020: Improving  Plan -Monitor closely  Thrombocytopenia-likely allergic related to cirrhosis -Patient received platelets in the ED -Monitor platelets with daily CBC.  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 for now + start d5LR Pain/Anxiety/Delirium protocol (if indicated): Not indicated VAP protocol (if indicated): Not indicated DVT prophylaxis: Held for now GI prophylaxis: Protonix as above Glucose control: evey 6 hours while NPO Mobility: Bedrest for now Disposition: ICU - keep in ICYU through 03/11/20  Goals of Care:  Last date of multidisciplinary goals of care discussion: N/A Family and staff present: N/A Summary of discussion: N/A Follow up goals of care discussion due: N/A Code Status: Full code per patient  paient updated 03/10/20    ATTESTATION & SIGNATURE   The patient Michelle Schneider is critically ill with multiple organ systems failure and requires high complexity decision making for assessment and support, frequent evaluation and titration of  therapies, application of advanced monitoring technologies and extensive interpretation of multiple databases.   Critical Care Time devoted to patient care services described in this note is  35  Minutes. This time reflects time of care of this signee Dr Kalman Shan. This critical care time does not reflect procedure time, or teaching time or supervisory time of PA/NP/Med student/Med Resident etc but could involve care discussion time     Dr. Kalman Shan, M.D., [facility].C.P Pulmonary and Critical Care Medicine Staff Physician [facility] [place] [facility] Pager: [phone], If no answer or between  15:00h - 7:00h: call [phone]  03/10/2020 11:57 AM    LABS    PULMONARY Recent Labs  Lab 03/09/20 1802  TCO2 22    CBC Recent Labs  Lab 03/09/20 2256  03/10/20 0154 03/10/20 0521 03/10/20 1038  HGB 5.3* 9.3* 8.6* 8.6*  HCT 16.7* 27.9* 24.0* 25.2*  WBC 6.0  --  4.5 4.2  PLT 95*  --  48* 48*    COAGULATION Recent Labs  Lab 03/09/20 1748  INR 1.9*    CARDIAC  No results for input(s): TROPONINI in the last 168 hours. No results for input(s): PROBNP in the last 168 hours.   CHEMISTRY Recent Labs  Lab 03/09/20 1748 03/09/20 1802 03/10/20 0154 03/10/20 0521  NA 135 136 139 138  K 3.0* 3.1* 3.5 3.8  CL 95* 95* 103 103  CO2 20*  --  18* 23  GLUCOSE 108* 103* 115* 127*  BUN 19 20 20 19   CREATININE 0.77 0.30* 0.75 0.69  CALCIUM 8.9  --  8.1* 8.1*  MG 1.7  --   --  1.5*  PHOS  --   --   --  3.0   Estimated Creatinine Clearance: 48.6 mL/min (by C-G formula based on SCr of 0.69 mg/dL).   LIVER Recent Labs  Lab 03/09/20 1748 03/10/20 0154 03/10/20 0521  AST 148* 145* 155*  ALT 49* 46* 46*  ALKPHOS 52 39 41  BILITOT 7.8* 9.9* 9.9*  PROT 6.6 5.4* 5.0*  ALBUMIN 3.1* 2.6* 2.5*  INR 1.9*  --   --      INFECTIOUS Recent Labs  Lab 03/09/20 2259 03/10/20 0202 03/10/20 0521  LATICACIDVEN 8.5* 5.9* 2.0*     ENDOCRINE CBG (last 3)  Recent Labs    03/09/20 2344  GLUCAP 73         IMAGING x48h  - image(s) personally visualized  -   highlighted in bold DG CHEST PORT 1 VIEW  Result Date: 03/09/2020 CLINICAL DATA:  Vomiting blood with dizziness and weakness. EXAM: PORTABLE CHEST 1 VIEW COMPARISON:  December 22, 2018 FINDINGS: A right internal jugular venous catheter is seen. Its distal tip extends laterally across the right apex and sits along the right axilla. There is no evidence of acute infiltrate, pleural effusion or pneumothorax. The heart size and mediastinal contours are within normal limits. The visualized skeletal structures are unremarkable. IMPRESSION: 1. Malpositioned right internal jugular venous catheter, without evidence of acute cardiopulmonary disease. Electronically Signed   By: December 24, 2018 M.D.   On: 03/09/2020 23:16

## 2020-03-11 ENCOUNTER — Inpatient Hospital Stay (HOSPITAL_COMMUNITY): Payer: No Typology Code available for payment source

## 2020-03-11 DIAGNOSIS — K922 Gastrointestinal hemorrhage, unspecified: Secondary | ICD-10-CM | POA: Diagnosis not present

## 2020-03-11 DIAGNOSIS — K7469 Other cirrhosis of liver: Secondary | ICD-10-CM | POA: Diagnosis not present

## 2020-03-11 DIAGNOSIS — R578 Other shock: Secondary | ICD-10-CM

## 2020-03-11 DIAGNOSIS — K754 Autoimmune hepatitis: Secondary | ICD-10-CM | POA: Diagnosis not present

## 2020-03-11 LAB — LACTIC ACID, PLASMA: Lactic Acid, Venous: 1.6 mmol/L (ref 0.5–1.9)

## 2020-03-11 LAB — COMPREHENSIVE METABOLIC PANEL
ALT: 48 U/L — ABNORMAL HIGH (ref 0–44)
AST: 164 U/L — ABNORMAL HIGH (ref 15–41)
Albumin: 2.4 g/dL — ABNORMAL LOW (ref 3.5–5.0)
Alkaline Phosphatase: 41 U/L (ref 38–126)
Anion gap: 10 (ref 5–15)
BUN: 13 mg/dL (ref 8–23)
CO2: 23 mmol/L (ref 22–32)
Calcium: 8.3 mg/dL — ABNORMAL LOW (ref 8.9–10.3)
Chloride: 105 mmol/L (ref 98–111)
Creatinine, Ser: 0.59 mg/dL (ref 0.44–1.00)
GFR, Estimated: >60 mL/min (ref >60)
Glucose, Bld: 118 mg/dL — ABNORMAL HIGH (ref 70–99)
Potassium: 3.3 mmol/L — ABNORMAL LOW (ref 3.5–5.1)
Sodium: 138 mmol/L (ref 135–145)
Total Bilirubin: 7.6 mg/dL — ABNORMAL HIGH (ref 0.3–1.2)
Total Protein: 5.1 g/dL — ABNORMAL LOW (ref 6.5–8.1)

## 2020-03-11 LAB — CBC
HCT: 22.3 % — ABNORMAL LOW (ref 36.0–46.0)
HCT: 25.3 % — ABNORMAL LOW (ref 36.0–46.0)
Hemoglobin: 7.9 g/dL — ABNORMAL LOW (ref 12.0–15.0)
Hemoglobin: 8.6 g/dL — ABNORMAL LOW (ref 12.0–15.0)
MCH: 32.6 pg (ref 26.0–34.0)
MCH: 31.7 pg (ref 26.0–34.0)
MCHC: 35.4 g/dL (ref 30.0–36.0)
MCHC: 34 g/dL (ref 30.0–36.0)
MCV: 92.1 fL (ref 80.0–100.0)
MCV: 93.4 fL (ref 80.0–100.0)
Platelets: DECREASED 10*3/uL (ref 150–400)
Platelets: 36 10*3/uL — ABNORMAL LOW (ref 150–400)
RBC: 2.42 MIL/uL — ABNORMAL LOW (ref 3.87–5.11)
RBC: 2.71 MIL/uL — ABNORMAL LOW (ref 3.87–5.11)
RDW: 19.8 % — ABNORMAL HIGH (ref 11.5–15.5)
RDW: 19.4 % — ABNORMAL HIGH (ref 11.5–15.5)
WBC: 4.3 10*3/uL (ref 4.0–10.5)
WBC: 4.6 10*3/uL (ref 4.0–10.5)
nRBC: 1.4 % — ABNORMAL HIGH (ref 0.0–0.2)
nRBC: 1.1 % — ABNORMAL HIGH (ref 0.0–0.2)

## 2020-03-11 LAB — BPAM RBC
Blood Product Expiration Date: 202201102359
Blood Product Expiration Date: 202201132359
ISSUE DATE / TIME: 202112252255
ISSUE DATE / TIME: 202112260028
Unit Type and Rh: 600
Unit Type and Rh: 600

## 2020-03-11 LAB — TYPE AND SCREEN
ABO/RH(D): A NEG
Antibody Screen: NEGATIVE
Unit division: 0
Unit division: 0

## 2020-03-11 LAB — MAGNESIUM: Magnesium: 1.8 mg/dL (ref 1.7–2.4)

## 2020-03-11 LAB — PHOSPHORUS: Phosphorus: 1.7 mg/dL — ABNORMAL LOW (ref 2.5–4.6)

## 2020-03-11 MED ORDER — ADULT MULTIVITAMIN W/MINERALS CH
1.0000 | ORAL_TABLET | Freq: Every day | ORAL | Status: DC
  Administered 2020-03-12 – 2020-03-14 (×3): 1 via ORAL
  Filled 2020-03-11 (×3): qty 1

## 2020-03-11 MED ORDER — PANTOPRAZOLE SODIUM 40 MG PO TBEC
40.0000 mg | DELAYED_RELEASE_TABLET | Freq: Every day | ORAL | Status: DC
  Administered 2020-03-13 – 2020-03-14 (×2): 40 mg via ORAL
  Filled 2020-03-11 (×2): qty 1

## 2020-03-11 MED ORDER — RIFAXIMIN 550 MG PO TABS
550.0000 mg | ORAL_TABLET | Freq: Two times a day (BID) | ORAL | Status: DC
  Administered 2020-03-11 – 2020-03-14 (×6): 550 mg via ORAL
  Filled 2020-03-11 (×6): qty 1

## 2020-03-11 MED ORDER — POTASSIUM CHLORIDE 10 MEQ/50ML IV SOLN
10.0000 meq | INTRAVENOUS | Status: AC
  Administered 2020-03-11 (×4): 10 meq via INTRAVENOUS
  Filled 2020-03-11 (×4): qty 50

## 2020-03-11 MED ORDER — LORAZEPAM 2 MG/ML IJ SOLN
1.0000 mg | INTRAMUSCULAR | Status: AC | PRN
Start: 2020-03-11 — End: 2020-03-14

## 2020-03-11 MED ORDER — MAGNESIUM SULFATE 2 GM/50ML IV SOLN
2.0000 g | Freq: Once | INTRAVENOUS | Status: AC
  Administered 2020-03-11: 2 g via INTRAVENOUS
  Filled 2020-03-11: qty 50

## 2020-03-11 MED ORDER — LORAZEPAM 1 MG PO TABS
1.0000 mg | ORAL_TABLET | ORAL | Status: AC | PRN
  Administered 2020-03-12: 1 mg via ORAL
  Filled 2020-03-11: qty 1

## 2020-03-11 MED ORDER — POTASSIUM PHOSPHATES 15 MMOLE/5ML IV SOLN
30.0000 mmol | Freq: Once | INTRAVENOUS | Status: AC
  Administered 2020-03-11: 30 mmol via INTRAVENOUS
  Filled 2020-03-11: qty 10

## 2020-03-11 NOTE — TOC Initial Note (Signed)
Transition of Care [facility]) - Initial/Assessment Note    Patient Details  Name: Michelle Schneider MRN: [id number] Date of Birth: May 31, 1960  Transition of Care [facility]) CM/SW Contact:    Bess Kinds, RN Phone Number: [phone] 03/11/2020, 3:49 PM  Clinical Narrative:                  Spoke with patient at the bedside. Discussed recommendation to discuss substance abuse resources. Patient denying problem. She stated that she used to have some wine every now and then, but that she has not had any wine since her liver problems.   Patient asked about getting a list of healthy foods. Discussed fruits and vegetables, lean meats, like chicken and fish, dairy, high fiber, and going easy on the salt shaker. Discussed information can be added to her discharge instructions.   Discussed her contact information. Patient stated that her cousin, Teddy Spike, is her number 1 HCPOA, and Ria Bush is her number 2 HCPOA. Discussed that she get a copy of her HCPOA to be on file. Patient stated that [place] lives in [place], but she is visiting right now d/t patient being in the hospital.   Patient stated that she is independent at home. No DME in the home for her. However, her brother lives with her. She stated that her brother had a stroke in 2009 and now ambulates with a walker. She has hired a Microbiologist for him. She stated that her cousin , Al Decant, is looking after her brother now.   Patient stated that she no longer has a PCP d/t previous PCP retired. However, she does have her liver doctor, Dr. Adela Lank, who she has regular follow up.   TOC following for transition needs.   Expected Discharge Plan: Home/Self Care Barriers to Discharge: Continued Medical Work up   Patient Goals and CMS Choice Patient states their goals for this hospitalization and ongoing recovery are:: return home CMS Medicare.gov Compare Post Acute Care list provided to:: Patient Choice offered to / list  presented to : NA  Expected Discharge Plan and Services Expected Discharge Plan: Home/Self Care In-house Referral: Clinical Social Work Discharge Planning Services: CM Consult   Living arrangements for the past 2 months: Single Family Home                                      Prior Living Arrangements/Services Living arrangements for the past 2 months: Single Family Home Lives with:: Self,Siblings Patient language and need for interpreter reviewed:: Yes        Need for Family Participation in Patient Care: No (Comment)     Criminal Activity/Legal Involvement Pertinent to Current Situation/Hospitalization: No - Comment as needed  Activities of Daily Living Home Assistive Devices/Equipment: None ADL Screening (condition at time of admission) Patient's cognitive ability adequate to safely complete daily activities?: Yes Is the patient deaf or have difficulty hearing?: No Does the patient have difficulty seeing, even when wearing glasses/contacts?: No Does the patient have difficulty concentrating, remembering, or making decisions?: Yes Patient able to express need for assistance with ADLs?: Yes Does the patient have difficulty dressing or bathing?: No Independently performs ADLs?: Yes (appropriate for developmental age) Does the patient have difficulty walking or climbing stairs?: No Weakness of Legs: None Weakness of Arms/Hands: None  Permission Sought/Granted  Emotional Assessment Appearance:: Appears stated age Attitude/Demeanor/Rapport: Engaged Affect (typically observed): Accepting Orientation: : Oriented to Self,Oriented to  Time,Oriented to Place,Oriented to Situation Alcohol / Substance Use: Alcohol Use Psych Involvement: No (comment)  Admission diagnosis:  Shock (HCC) [R57.9] GI bleed [K92.2] Acute upper GI bleed [K92.2] History of esophageal varices [Z87.19] Hematemesis, presence of nausea not specified [K92.0] Patient Active  Problem List   Diagnosis Date Noted  . History of esophageal varices   . Thrombocytopenia (HCC) 03/09/2020  . Hematemesis 03/09/2020  . Hemorrhagic shock (HCC) 03/09/2020  . Acute upper GI bleed 03/09/2020  . Subdural hematoma (HCC) 12/22/2018  . SDH (subdural hematoma) (HCC) 12/22/2018  . Subdural bleeding (HCC) 12/22/2018  . Autoimmune hepatitis (HCC) 11/07/2015  . Hypoxia 08/24/2015  . Pleural effusion 08/24/2015  . Elevated bilirubin 08/24/2015  . Liver cirrhosis (HCC) 08/24/2015  . Elevated INR 08/24/2015  . Anemia 08/24/2015  . Esophageal varices in cirrhosis (HCC)   . Ascites 08/23/2015   PCP:  Patient, No Pcp Per Pharmacy:   [facility] - [place], [place] - [address] [place] [address] Phone: [phone] Fax: [phone]     Social Determinants of Health (SDOH) Interventions    Readmission Risk Interventions Readmission Risk Prevention Plan 12/28/2018  Post Dischage Appt Complete  Medication Screening Complete  Transportation Screening Complete  Some recent data might be hidden

## 2020-03-11 NOTE — Progress Notes (Signed)
eLink Physician-Brief Progress Note Patient Name: Michelle Schneider DOB: [birth year] MRN: [id number]   Date of Service  03/11/2020  HPI/Events of Note  CBC q6 hr follow up as per hand off.  Hg < 8, slowly trending down. S/p PRBC. Hematemesis-most likely related to known esophageal varices versus separate lesion in the GE junction.  Status post clipping and banding 03/10/2020 by GI.   Plan -Octreotide infusion -Protonix infusion -CBC every every 6 hours  eICU Interventions  - transfuse if Hg < 7. Watch for any active bleeding.ECHO : EF 70%. Discussed with RN.  HR is 79, art line 120 SBP.      Intervention Category Intermediate Interventions: Diagnostic test evaluation  Ranee Gosselin 03/11/2020, 12:10 AM

## 2020-03-11 NOTE — Progress Notes (Addendum)
Daily Rounding Note  03/11/2020, 12:02 PM  LOS: 2 days   SUBJECTIVE:   Chief complaint: hematemesis.  Anemia.  Cirrhosis.  esoph varices   Levophed discontinued early a.m. 12/26. No abd pain, no n/v.  BM's consistently brown.  NPO, slightly hungry.     Pt's POA called RN just now and says pt is actively drinking ETOH  This POA found pt down in feces and vomit just PTA, surprise visit.  Bottles of empty and 1/2 empty wine all over the house, wine in a glass.  Pt repeatedly denies etoh use.  No ETOH level obtained at arrival.   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     OBJECTIVE:         Vital signs in last 24 hours:    Temp:  [98.1 F (36.7 C)-98.7 F (37.1 C)] 98.1 F (36.7 C) (12/27 1100) Pulse Rate:  [72-91] 76 (12/27 1100) Resp:  [12-20] 20 (12/27 1100) BP: (97-117)/(63-74) 117/73 (12/27 1100) SpO2:  [91 %-97 %] 95 % (12/27 1100) Arterial Line BP: (99-143)/(47-70) 119/59 (12/27 1100) Weight:  [46.1 kg] 46.1 kg (12/27 0600) Last BM Date: 03/10/20 Filed Weights   03/10/20 0500 03/10/20 0820 03/11/20 0600  Weight: 42.6 kg 41.7 kg 46.1 kg   General: frail, chronically ill,  Slightly jaundiced.  + icterus   Heart: RRR Chest: clear.  No cough or dyspnea.  Abdomen: soft, NT, NT.  BS hypoactive but normal.  Extremities: no CCE Neuro/Psych:  Alert.  Appropriate.  Oriented x 3.  No tremor.    Intake/Output from previous day: 12/26 0701 - 12/27 0700 In: 2398 [I.V.:2139.1; IV Piggyback:259] Out: 390 [Urine:390]  Intake/Output this shift: Total I/O In: 422.6 [I.V.:291.7; IV Piggyback:130.9] Out: -   Lab Results: Recent Labs    03/10/20 1713 03/10/20 2318 03/11/20 0900  WBC 5.8 5.6 4.3  HGB 8.5* 7.8* 7.9*  HCT 23.8* 22.6* 22.3*  PLT 48* 41* PLATELET CLUMPS NOTED ON SMEAR, COUNT APPEARS DECREASED   BMET Recent Labs     03/10/20 0154 03/10/20 0521 03/11/20 0700  NA 139 138 138  K 3.5 3.8 3.3*  CL 103 103 105  CO2 18* 23 23  GLUCOSE 115* 127* 118*  BUN 20 19 13   CREATININE 0.75 0.69 0.59  CALCIUM 8.1* 8.1* 8.3*   LFT Recent Labs    03/10/20 0154 03/10/20 0521 03/11/20 0700  PROT 5.4* 5.0* 5.1*  ALBUMIN 2.6* 2.5* 2.4*  AST 145* 155* 164*  ALT 46* 46* 48*  ALKPHOS 39 41 41  BILITOT 9.9* 9.9* 7.6*   PT/INR Recent Labs    03/09/20 1748  LABPROT 20.8*  INR 1.9*   Hepatitis Panel No results for input(s): HEPBSAG, HCVAB, HEPAIGM, HEPBIGM in the last 72 hours.  Studies/Results: DG CHEST PORT 1 VIEW  Result Date: 03/09/2020 CLINICAL DATA:  Vomiting blood with dizziness and weakness. EXAM: PORTABLE CHEST 1 VIEW COMPARISON:  December 22, 2018 FINDINGS: A right internal jugular venous catheter is seen. Its distal tip extends laterally across the right apex and sits along the right axilla. There is no evidence of acute infiltrate, pleural effusion or pneumothorax. The heart size and mediastinal contours are within normal limits. The visualized skeletal structures are unremarkable. IMPRESSION: 1. Malpositioned right internal jugular venous catheter, without evidence of acute cardiopulmonary disease. Electronically Signed   By: December 24, 2018 M.D.   On:  03/09/2020 23:16   ECHOCARDIOGRAM COMPLETE  Result Date: 03/10/2020 IMPRESSIONS  1. Left ventricular ejection fraction, by estimation, is 70 to 75%. The left ventricle has hyperdynamic function. The left ventricle has no regional wall motion abnormalities. Left ventricular diastolic parameters are consistent with Grade I diastolic dysfunction (impaired relaxation).  2. Right ventricular systolic function is hyperdynamic. The right ventricular size is normal. There is normal pulmonary artery systolic pressure.  3. Left atrial size was mildly dilated.  4. Right atrial size was mildly dilated.  5. The mitral valve is normal in structure. No evidence of  mitral valve regurgitation. No evidence of mitral stenosis.  6. The aortic valve is tricuspid. There is mild calcification of the aortic valve. Aortic valve regurgitation is not visualized. Mild to moderate aortic valve sclerosis/calcification is present, without any evidence of aortic stenosis.  7. The inferior vena cava is normal in size with greater than 50% respiratory variability, suggesting right atrial pressure of 3 mmHg. Electronically signed by Arvilla Meres MD Signature Date/Time: 03/10/2020/8:22:55 PM    Final     ASSESMENT:   *   Melena, Hematemesis Previous small varices on EGD 06/2017.  Did not tolerate propanolol 12/26 EGD: Blood in the esophagus actively bleeding at GEJ, appearance not typical for variceal bleeding but moderate esophageal varices close by so suspect variceal bleed.  Varices treated with 2 hemoclips and 3 bands.  Small hiatal hernia.  Blood in the stomach.  No gastric varices or gastric pathology seen after gastric lavage. Day 3 octreotide and Protonix drip.  Rocephin day 3  *    Decompensated cirrhosis, autoimmune hepatitis and (new discovery) ETOH abuse.  MELD-NA 21 at arrival. Cirrhosis diagnosed 08/2015.  Lost to GI follow-up after April 2019.  Last office visit with [facility] transplant/hepatology clinic 08/2017 Previous treatment with prednisone.  Azathioprine discontinued due to alopecia. Currently no AIH meds other than "essential oils".  *   Blood loss anemia.  Hgb 5.3 >> PRBC x 2 >> 8.6 >> 7.9. MCV 109.  *   Thrombocytopenia.  Platelets in the 40s. Platelets  X 1 u  *    Coagulopathy.  INR 1.9 at presentation 3 days ago.  Vitamin K 10 mg IV on 12/25.    PLAN   *   Protonix and octreotide drips finish 12/28 at 1800 (after 72 hours).  Protonix 40 mg po daily after this.    *   Needs ultrasound for hepatoma screening, she is npo so can proceed w [place] today.   Start regular diet after ultrasound.   AFP ordered for AM.   *   ETOH abstinence.  I  did not confront hpt w the info regarding ETOH at her home provided to RN by pt's friend and HCPOA.   Needs to be confronted w this and needs ETOH rehab, abstinence.    *   CBC 5 PM, 5 AM.      Jennye Moccasin  03/11/2020, 12:02 PM Phone [phone]    Attending physician's note   I have taken an interval history, reviewed the chart and examined the patient. I agree with the Advanced Practitioner's note, impression and recommendations.   Decompensated liver cirrhosis d/t AIH/ETOH. Pt was not forthcoming about ETOH.  Glad we got POA's input.  Intolerant to azathioprine d/t alopecia.  Previously followed by [facility] Eso varices s/p EVL 12/26.  Intolerant to beta-blockers. UGI bleed d/t MW tear s/p endoscopic clipping.  No further bleeding. Hepatic encephalopathy, likely  exacerbated by UGI bleed. H/O ascites  Plan: -Advance diet to low-salt normal protein diet. -Trend CBC, electrolytes. -CIWA protocol. -Add rifaximin 550 mg p.o. BID. -We will continue IV octreotide and Protonix x 72 hours.  Then switch to p.o. Protonix. -Continue Rocephin for total of 5 days. -[place] abdo for [facility] screening, check AFP. -Strict alcohol abstinence.  I have discussed with pt. -Repeat EGD with EVL in 4-6 weeks. -Avoid nonsteroidals. -FU with Dr Adela Lank as oupt -Also FU with [facility] as outpt. -Social work consult (for home situation).  May need short-term SNF placement.   Edman Circle, MD [facility] [phone]

## 2020-03-11 NOTE — Progress Notes (Signed)
NAME:  Michelle Schneider, MRN:  [id number], DOB:  [birth year], LOS: 2 ADMISSION DATE:  03/09/2020, CONSULTATION DATE:  03/09/20 REFERRING MD:  Adela Lank, CHIEF COMPLAINT:  hematemesis  Brief History:  59 yo w hx of autoimmune hepatitis presents with hematemesis and melena.  History of Present Illness:  This is a 59 yo female with history of autoimmune hepatitis, cirrhosis and varices who presented with melena x4 to the ED. She has been having nausea and vomiting. Some of the vomit just had blood mixed in and other times it pure blood. She notes that she has had black tarry stools x 1 day as well. She has never had this before. No NSAID use. No history of Hpylori or ulcers. She has not been exposed to any Covid. No SOB or fevers. Is not vaccinated.   In ED she had one more bout of hematemesis. Was estimated by staff to be about 200 cc's and be Frankly bloody. In ED patient noted to have Hypotension that was persistent.  Additionally patient had labs that were notable for hemoglobin of 8.1 which is down from 9.3 previously platelet count of 19 and an INR of 1.9.  2 units of blood were ordered by the hospitalist team as well as platelets.  GI was consulted in the ED and recommended supportive therapy unless patient should decompensate abruptly and require intubation.   Of note patient with history of autoimmune hepatitis that was diagnosed in 2017 she had a positive ANA IgG and smooth muscle antibodies additionally she had a biopsy that was consistent with autoimmune hepatitis. she had been on prednisone at one point.  Was transitioned to azathioprine.  Patient did not tolerate the azathioprine.  She felt she was more fatigued with that and was losing hair.  She had self tapered off of prednisone and had discontinued Imuran. There is documentation that patient was using essential oils as well.   Patient tells me that she has not been on any immunosuppressive medications.  Not taking any prednisone.  Not  having taken any Imuran.  Not following up with any GI docs.  She has not had any recent endoscopies or colonoscopies.  She is not take any other medications for any other medical conditions at this time.   EGD 11/14/2015 - small varices [place] - 03/01/2017 - cirrhotic changes without mass lesions  Past Medical History:  Autoimmune hepatitis Cirrhosis varices   Significant Hospital Events:  Admitted to PCCM on 03-09-2020  Consults:  PCCM  Procedures:  Central line and A-line placement on 03-09-2020 Endoscopy 12/26 >> esophageal varices, bleeding noted at GE junction, banded, clips x2  Significant Diagnostic Tests:  Echocardiogram> LVEF 70-75%, hyperdynamic, grade 1 diastolic dysfunction, normal RV size and function, normal PASP  Micro Data:  Blood 12/26 >   Antimicrobials:  Ceftriaxone 12/25 >>   Interim History / Subjective:   Remained hemodynamically stable Hemoglobin trend, most recent seven-point 8 AM 12/27 Has had some evidence for evolving delirium per RN.  Last drink reported to be 12/25  Objective   Blood pressure 117/74, pulse 75, temperature 98.5 F (36.9 C), temperature source Oral, resp. rate 14, height 5\' 2"  (1.575 m), weight 46.1 kg, SpO2 96 %.        Intake/Output Summary (Last 24 hours) at 03/11/2020 0828 Last data filed at 03/11/2020 0700 Gross per 24 hour  Intake 2244.4 ml  Output 390 ml  Net 1854.4 ml   Filed Weights   03/10/20 0500 03/10/20 0820 03/11/20 0600  Weight: 42.6 kg 41.7 kg 46.1 kg    Examination: General: Thin cachectic woman, laying in bed in no distress HEENT: Oropharynx dry, pupils equal, no scleral icterus Neck: No stridor Lungs: Clear bilaterally, no wheezing or crackles Heart: Regular, distant, no murmur Abdomen: Soft, nontender, nondistended, hypoactive bowel sounds Extremities: No edema Skin: No rash    Resolved Hospital Problem list   Not applicable  Assessment & Plan:  This is a 59 year old female with history  as noted above who presents for chief complaint of hematemesis.  On review of systems patient noted to have melena.   Hematemesis due to esophageal varices versus separate lesion in the GE junction.  Status post clipping and banding 03/10/2020 by GI.   Plan -Continue octreotide and Protonix infusions as per GI recommendations.  Transition off as per their plans -Follow CBC for stability -Hemoglobin goal 7.0 -Ceftriaxone for SBP prophylaxis -N.p.o. for now, start p.o. diet when okay with GI  Shock-likely hemorrhagic and distributive from liver failure, resolved  Plan -Follow hemodynamics, telemetry -No resuscitation, PRBC as indicated  Hemorrhagic anemia -03/10/2020: Appears to have resolved after GI procedure  Plan -Hemoglobin goal 7.0, PRBC if indicated   Lactic acidosis secondary to the above, cleared 12/26  Plan -Appears to have resolved.  Recheck lactic acid if any of his hemodynamic instability  Thrombocytopenia-likely allergic related to cirrhosis -Patient received platelets in the ED -Monitor platelets with daily CBC, not available this morning 12/27.  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  Per Charlyne Quale  [phone] (patient allowed RN to get/give infor to this person) -> drinks ETOH and last drink 12/25  Plan -Appreciate GI assistance -Not on any prednisone at this time  High risk alcohol withdrawal delirium  Plan -Initiate CIWA protocol on 12/27 -Thiamine, folate  Electrolyte imbalance  Plan -Replace potassium, magnesium, phosphorus on 12/27   Best practice (evaluated daily)  Diet: N.p.o. for now + start d5LR Pain/Anxiety/Delirium protocol (if indicated): Not indicated VAP protocol (if indicated): Not indicated DVT prophylaxis: SCD GI prophylaxis: Protonix as above Glucose control: evey 6 hours while NPO Mobility: Bedrest for now Disposition: Probably transition out of ICU on 12/27  Goals of Care:   Last date of multidisciplinary goals of care discussion: N/A Family and staff present: N/A Summary of discussion: N/A Follow up goals of care discussion due: N/A Code Status: Full code per patient     ATTESTATION & SIGNATURE   The patient Michelle Schneider is critically ill with multiple organ systems failure and requires high complexity decision making for assessment and support, frequent evaluation and titration of therapies, application of advanced monitoring technologies and extensive interpretation of multiple databases.   Independent CC time 32 minutes   Levy Pupa, MD, PhD 03/11/2020, 8:42 AM [facility] [phone] or if no answer [phone]     LABS    PULMONARY Recent Labs  Lab 03/09/20 1802  TCO2 22    CBC Recent Labs  Lab 03/10/20 1038 03/10/20 1713 03/10/20 2318  HGB 8.6* 8.5* 7.8*  HCT 25.2* 23.8* 22.6*  WBC 4.2 5.8 5.6  PLT 48* 48* 41*    COAGULATION Recent Labs  Lab 03/09/20 1748  INR 1.9*    CARDIAC  No results for input(s): TROPONINI in the last 168 hours. No results for input(s): PROBNP in the last 168 hours.   CHEMISTRY Recent Labs  Lab 03/09/20 1748 03/09/20 1802 03/10/20 0154 03/10/20 0521  NA  135 136 139 138  K 3.0* 3.1* 3.5 3.8  CL 95* 95* 103 103  CO2 20*  --  18* 23  GLUCOSE 108* 103* 115* 127*  BUN 19 20 20 19   CREATININE 0.77 0.30* 0.75 0.69  CALCIUM 8.9  --  8.1* 8.1*  MG 1.7  --   --  1.5*  PHOS  --   --   --  3.0   Estimated Creatinine Clearance: 53.7 mL/min (by C-G formula based on SCr of 0.69 mg/dL).   LIVER Recent Labs  Lab 03/09/20 1748 03/10/20 0154 03/10/20 0521  AST 148* 145* 155*  ALT 49* 46* 46*  ALKPHOS 52 39 41  BILITOT 7.8* 9.9* 9.9*  PROT 6.6 5.4* 5.0*  ALBUMIN 3.1* 2.6* 2.5*  INR 1.9*  --   --      INFECTIOUS Recent Labs  Lab 03/09/20 2259 03/10/20 0202 03/10/20 0521  LATICACIDVEN 8.5* 5.9* 2.0*     ENDOCRINE CBG (last 3)  Recent Labs     03/09/20 2344  GLUCAP 73

## 2020-03-11 NOTE — Progress Notes (Signed)
Pt's friend, Michelle Schneider, called stating she is the [facility] for the patient. I asked that she bring this documentation in for [place] to have on file. Michelle Schneider states that the patient has a drinking problem and is in denial when asked. She has never admitted her drinking problem to her Dr's and several other family members. Michelle Schneider stated that she is fearful for Michelle Schneider (patient) and her brothers life. 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 She states the patient does not have heat and that uopn her visit on 12/25 she found the patient in vomit, feces, and empty wine bottles all around the house. The patient reported that she hadn't eaten in three days and Michelle Schneider states the brother was staying on the 3rd floor of the home and he was confused.   I have reached out to Michelle Schneider, Case Manager, Michelle Schneider, Social Work, and Dr Chales Abrahams to inform the care team of all the information provided to me.   Michelle Schneider asked for resources and help for this situation. I encouraged Michelle Schneider to call Adult Protective Services of [place] to let them know what she witnessed on 12/25. Also, I provided Michelle Schneider with a list of local substance abuse programs in her area.

## 2020-03-12 ENCOUNTER — Encounter (HOSPITAL_COMMUNITY): Payer: Self-pay | Admitting: Gastroenterology

## 2020-03-12 DIAGNOSIS — K754 Autoimmune hepatitis: Secondary | ICD-10-CM | POA: Diagnosis not present

## 2020-03-12 DIAGNOSIS — R578 Other shock: Secondary | ICD-10-CM | POA: Diagnosis not present

## 2020-03-12 DIAGNOSIS — K7469 Other cirrhosis of liver: Secondary | ICD-10-CM | POA: Diagnosis not present

## 2020-03-12 DIAGNOSIS — E43 Unspecified severe protein-calorie malnutrition: Secondary | ICD-10-CM | POA: Diagnosis present

## 2020-03-12 DIAGNOSIS — K5900 Constipation, unspecified: Secondary | ICD-10-CM | POA: Diagnosis not present

## 2020-03-12 LAB — BASIC METABOLIC PANEL
Anion gap: 7 (ref 5–15)
BUN: 8 mg/dL (ref 8–23)
CO2: 23 mmol/L (ref 22–32)
Calcium: 8.1 mg/dL — ABNORMAL LOW (ref 8.9–10.3)
Chloride: 104 mmol/L (ref 98–111)
Creatinine, Ser: 0.52 mg/dL (ref 0.44–1.00)
GFR, Estimated: >60 mL/min (ref >60)
Glucose, Bld: 98 mg/dL (ref 70–99)
Potassium: 3.5 mmol/L (ref 3.5–5.1)
Sodium: 134 mmol/L — ABNORMAL LOW (ref 135–145)

## 2020-03-12 LAB — CBC
HCT: 24.2 % — ABNORMAL LOW (ref 36.0–46.0)
Hemoglobin: 8.1 g/dL — ABNORMAL LOW (ref 12.0–15.0)
MCH: 31.8 pg (ref 26.0–34.0)
MCHC: 33.5 g/dL (ref 30.0–36.0)
MCV: 94.9 fL (ref 80.0–100.0)
Platelets: 42 10*3/uL — ABNORMAL LOW (ref 150–400)
RBC: 2.55 MIL/uL — ABNORMAL LOW (ref 3.87–5.11)
RDW: 19.5 % — ABNORMAL HIGH (ref 11.5–15.5)
WBC: 4.3 10*3/uL (ref 4.0–10.5)
nRBC: 1.9 % — ABNORMAL HIGH (ref 0.0–0.2)

## 2020-03-12 LAB — PHOSPHORUS: Phosphorus: 2.5 mg/dL (ref 2.5–4.6)

## 2020-03-12 LAB — MAGNESIUM: Magnesium: 1.8 mg/dL (ref 1.7–2.4)

## 2020-03-12 MED ORDER — BISACODYL 10 MG RE SUPP: 10.0000 mg | Freq: Once | RECTAL | Status: DC

## 2020-03-12 MED ORDER — THIAMINE HCL 100 MG PO TABS
100.0000 mg | ORAL_TABLET | Freq: Every day | ORAL | Status: DC
  Administered 2020-03-12 – 2020-03-14 (×3): 100 mg via ORAL
  Filled 2020-03-12 (×3): qty 1

## 2020-03-12 MED ORDER — DOCUSATE SODIUM 100 MG PO CAPS
200.0000 mg | ORAL_CAPSULE | Freq: Two times a day (BID) | ORAL | Status: DC
  Administered 2020-03-12 – 2020-03-13 (×4): 200 mg via ORAL
  Filled 2020-03-12 (×5): qty 2

## 2020-03-12 MED ORDER — LACTULOSE 10 GM/15ML PO SOLN
20.0000 g | Freq: Two times a day (BID) | ORAL | Status: DC
  Administered 2020-03-12 – 2020-03-13 (×2): 20 g via ORAL
  Filled 2020-03-12 (×2): qty 30

## 2020-03-12 MED ORDER — FOLIC ACID 1 MG PO TABS
1.0000 mg | ORAL_TABLET | Freq: Every day | ORAL | Status: DC
  Administered 2020-03-12 – 2020-03-14 (×3): 1 mg via ORAL
  Filled 2020-03-12 (×3): qty 1

## 2020-03-12 NOTE — Progress Notes (Signed)
Patient arrived from [place] on a hospital bed, assessment completed see flow sheet, placed on tele CCMD notified, patient oriented to room and staff, bed in lowest position, call bell within reach will continue to monitor.

## 2020-03-12 NOTE — Progress Notes (Signed)
D/c'd Lfem CVC per protcol. At 1700.  Pressure held X . Site level- 0. Dsg CDI. VS 109/70. 94% RA. SP Instructions given Pt verbalized understanding . Will conitnue to monitor site per protocol.

## 2020-03-12 NOTE — Progress Notes (Signed)
Transferred to 4E with personal belongings.

## 2020-03-12 NOTE — Progress Notes (Addendum)
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  Patient states that this is not happened before.  She has no previous history of aspirin use H. pylori or ulcers.  In the emergency room, she had witnessed hematemesis and lab work noted a platelet count of 19, INR of 1.9 and hemoglobin of 8.1 (down from previous 9.3).  Patient was admitted to the hospitalist service and transfused 2 units of blood plus platelets.  GI consulted.    Found to be in hemorrhagic shock.  Patient placed in ICU.  Underwent endoscopy on 12/26 with esophageal varices and bleeding noted at the GE junction which was banded with clips x2 placed.  Bleeding and shock stabilized.  Ultrasound of the abdomen done on 12/27 also noted questionable acute cholecystitis (ordered as patient was complaining of abdominal pain)  Assessment/Plan: Principal Problem:   Hemorrhagic shock (HCC) secondary to upper GI bleed from esophageal varices: Status post 2 unit packed Michelle Schneider blood cell transfusion.  Patient has underlying thrombocytopenia from liver disease.  Status post endoscopy with banding and clips placed at GE junction.  For now, appears to be stable.  Patient finishes 72 hours of Protonix and octreotide infusion this evening.  Active Problems:   Autoimmune hepatitis (HCC) causing secondary cirrhosis, esophageal varices and coagulopathy and thrombocytopenia: Monitor for further bleeding.  Patient diagnosed in 2017 and at that time had a positive ANA IgG and smooth muscle antibodies.  She had been on prednisone at some point.  Was transitioned to azathioprine which she did not tolerate due to side effects.  She stopped using Imuran as well.   Status post transfusion of platelets.    Severe protein-calorie malnutrition (HCC): Nutrition to see.  BMI of 18.6.  Poor p.o. intake    Constipation: Patient states that she has not been able to go now for several days.  Feels like is very hard in her belly.  Tried mild laxatives on 12/27 which only caused abdominal cramping.  GI started lactulose.  Questionable acute cholecystitis: No plans for HIDA scan as this would not be a reliable test given patient's elevated bilirubin.  She was self not having right upper quadrant abdominal pain she is not having pain associated with eating  Chronic diastolic heart failure: Monitor and will check BNP.  Lactic acidosis: Felt to be secondary to intravascular volume depletion.  No evidence of sepsis.  Sepsis ruled out.  Code Status: Full code  Family Communication: Left message for family  Disposition Plan: Home hopefully in next 1 to 2 days once hemoglobin stabilized and cleared from GI standpoint  Consultants:  Gastroenterology  Critical care  Procedures:  Echocardiogram done 12/26: Diastolic heart failure, preserved ejection fraction  Endoscopy done 12/26  Status post 2 units packed red blood cell transfusion  Status post sixpack platelet transfusion  Antimicrobials:  IV Rocephin for SBP prophylaxis  DVT prophylaxis: SCDs   Objective: Vitals:   03/12/20 1000 03/12/20 1105  BP:    Pulse:    Resp: 19   Temp:  98.4 F (36.9 C)  SpO2:      Intake/Output Summary (Last 24 hours) at 03/12/2020 1318 Last data filed at 03/12/2020 1100 Gross per 24 hour  Intake 1673.95 ml  Output 600  ml  Net 1073.95 ml   Filed Weights   03/10/20 0820 03/11/20 0600 03/12/20 0500  Weight: 41.7 kg 46.1 kg 46.1 kg   Body mass index is 18.59 kg/m.  Exam:   General: Alert and oriented x3, fatigued and mild distress from abdominal pain  HEENT: Normocephalic and atraumatic, mucous membrane slightly dry.  Sclera icteric  Cardiovascular:  Regular rate and rhythm, S1-S2  Respiratory: Clear to auscultation bilaterally  Abdomen: Soft, mildly distended, mild nonspecific tenderness, hypoactive bowel sounds  Musculoskeletal: No clubbing or cyanosis, trace pitting edema  Skin: Jaundiced  Psychiatry: Anxious, but otherwise appropriate, no evidence of psychoses   Data Reviewed: CBC: Recent Labs  Lab 03/09/20 1748 03/09/20 1802 03/10/20 1713 03/10/20 2318 03/11/20 0900 03/11/20 1830 03/12/20 0512  WBC 4.4   < > 5.8 5.6 4.3 4.6 4.3  NEUTROABS 3.1  --   --   --   --   --   --   HGB 8.1*   < > 8.5* 7.8* 7.9* 8.6* 8.1*  HCT 24.5*   < > 23.8* 22.6* 22.3* 25.3* 24.2*  MCV 104.7*   < > 91.2 92.2 92.1 93.4 94.9  PLT 19*   < > 48* 41* PLATELET CLUMPS NOTED ON SMEAR, COUNT APPEARS DECREASED 36* 42*   < > = values in this interval not displayed.   Basic Metabolic Panel: Recent Labs  Lab 03/09/20 1748 03/09/20 1802 03/10/20 0154 03/10/20 0521 03/11/20 0700 03/12/20 0512  NA 135 136 139 138 138 134*  K 3.0* 3.1* 3.5 3.8 3.3* 3.5  CL 95* 95* 103 103 105 104  CO2 20*  --  18* 23 23 23   GLUCOSE 108* 103* 115* 127* 118* 98  BUN 19 20 20 19 13 8   CREATININE 0.77 0.30* 0.75 0.69 0.59 0.52  CALCIUM 8.9  --  8.1* 8.1* 8.3* 8.1*  MG 1.7  --   --  1.5* 1.8 1.8  PHOS  --   --   --  3.0 1.7* 2.5   GFR: Estimated Creatinine Clearance: 53.7 mL/min (by C-G formula based on SCr of 0.52 mg/dL). Liver Function Tests: Recent Labs  Lab 03/09/20 1748 03/10/20 0154 03/10/20 0521 03/11/20 0700  AST 148* 145* 155* 164*  ALT 49* 46* 46* 48*  ALKPHOS 52 39 41 41  BILITOT 7.8* 9.9* 9.9* 7.6*  PROT 6.6 5.4* 5.0* 5.1*  ALBUMIN 3.1* 2.6* 2.5* 2.4*   Recent Labs  Lab 03/09/20 1748 03/09/20 2256  LIPASE 96* 65*   Recent Labs  Lab 03/10/20 0202  AMMONIA 65*   Coagulation Profile: Recent Labs  Lab 03/09/20 1748  INR 1.9*   Cardiac Enzymes: No results for input(s): CKTOTAL, CKMB, CKMBINDEX, TROPONINI in the last 168  hours. BNP (last 3 results) No results for input(s): PROBNP in the last 8760 hours. HbA1C: No results for input(s): HGBA1C in the last 72 hours. CBG: Recent Labs  Lab 03/09/20 2344  GLUCAP 73   Lipid Profile: No results for input(s): CHOL, HDL, LDLCALC, TRIG, CHOLHDL, LDLDIRECT in the last 72 hours. Thyroid Function Tests: No results for input(s): TSH, T4TOTAL, FREET4, T3FREE, THYROIDAB in the last 72 hours. Anemia Panel: No results for input(s): VITAMINB12, FOLATE, FERRITIN, TIBC, IRON, RETICCTPCT in the last 72 hours. Urine analysis:    Component Value Date/Time   COLORURINE AMBER (A) 03/10/2020 0708   APPEARANCEUR HAZY (A) 03/10/2020 0708   LABSPEC 1.026 03/10/2020 0708   PHURINE 6.0 03/10/2020 0708   GLUCOSEU NEGATIVE 03/10/2020 0708   HGBUR  LARGE (A) 03/10/2020 0708   BILIRUBINUR SMALL (A) 03/10/2020 0708   KETONESUR 80 (A) 03/10/2020 0708   PROTEINUR NEGATIVE 03/10/2020 0708   NITRITE NEGATIVE 03/10/2020 0708   LEUKOCYTESUR NEGATIVE 03/10/2020 0708   Sepsis Labs: @LABRCNTIP (procalcitonin:4,lacticidven:4)  ) Recent Results (from the past 240 hour(s))  Resp Panel by RT-PCR (Flu A&B, Covid) Nasopharyngeal Swab     Status: None   Collection Time: 03/09/20  5:48 PM   Specimen: Nasopharyngeal Swab; Nasopharyngeal(NP) swabs in vial transport medium  Result Value Ref Range Status   SARS Coronavirus 2 by RT PCR NEGATIVE NEGATIVE Final    Comment: (NOTE) SARS-CoV-2 target nucleic acids are NOT DETECTED.  The SARS-CoV-2 RNA is generally detectable in upper respiratory specimens during the acute phase of infection. The lowest concentration of SARS-CoV-2 viral copies this assay can detect is 138 copies/mL. A negative result does not preclude SARS-Cov-2 infection and should not be used as the sole basis for treatment or other patient management decisions. A negative result may occur with  improper specimen collection/handling, submission of specimen other than  nasopharyngeal swab, presence of viral mutation(s) within the areas targeted by this assay, and inadequate number of viral copies(<138 copies/mL). A negative result must be combined with clinical observations, patient history, and epidemiological information. The expected result is Negative.  Fact Sheet for Patients:  03/11/20  Fact Sheet for Healthcare Providers:  BloggerCourse.com  This test is no t yet approved or cleared by the [employer].[place] FDA and  has been authorized for detection and/or diagnosis of SARS-CoV-2 by FDA under an Emergency Use Authorization (EUA). This EUA will remain  in effect (meaning this test can be used) for the duration of the COVID-19 declaration under Section 564(b)(1) of the Act, 21 U.S.C.section 360bbb-3(b)(1), unless the authorization is terminated  or revoked sooner.       Influenza A by PCR NEGATIVE NEGATIVE Final   Influenza B by PCR NEGATIVE NEGATIVE Final    Comment: (NOTE) The Xpert Xpress SARS-CoV-2/FLU/RSV plus assay is intended as an aid in the diagnosis of influenza from Nasopharyngeal swab specimens and should not be used as a sole basis for treatment. Nasal washings and aspirates are unacceptable for Xpert Xpress SARS-CoV-2/FLU/RSV testing.  Fact Sheet for Patients: [place]  Fact Sheet for Healthcare Providers: BloggerCourse.com  This test is not yet approved or cleared by the [employer]ious[employer]ker.[employer] FDA and has been authorized for detection and/or diagnosis of SARS-CoV-2 by FDA under an Emergency Use Authorization (EUA). This EUA will remain in effect (meaning this test can be used) for the duration of the COVID-19 declaration under Section 564(b)(1) of the Act, 21 U.S.C. section 360bbb-3(b)(1), unless the authorization is terminated or revoked.  Performed at [facility], [address] [place], [place]    MRSA PCR Screening     Status: None   Collection Time: 03/10/20 12:16 AM   Specimen: Nasal Mucosa; Nasopharyngeal  Result Value Ref Range Status   MRSA by PCR NEGATIVE NEGATIVE Final    Comment:        The GeneXpert MRSA Assay (FDA approved for NASAL specimens only), is one component of a comprehensive MRSA colonization surveillance program. It is not intended to diagnose MRSA infection nor to guide or monitor treatment for MRSA infections. Performed at [facility], [address], [place], [place]   Culture, blood (routine x 2)     Status: None (Preliminary result)   Collection Time: 03/10/20  5:43 AM  Specimen: BLOOD LEFT HAND  Result Value Ref Range Status   Specimen Description BLOOD LEFT HAND  Final   Special Requests   Final    BOTTLES DRAWN AEROBIC AND ANAEROBIC Blood Culture adequate volume   Culture   Final    NO GROWTH 1 DAY Performed at [facility], [address] [place], [place] [address]    Report Status PENDING  Incomplete  Culture, blood (routine x 2)     Status: None (Preliminary result)   Collection Time: 03/10/20  5:43 AM   Specimen: BLOOD RIGHT HAND  Result Value Ref Range Status   Specimen Description BLOOD RIGHT HAND  Final   Special Requests   Final    BOTTLES DRAWN AEROBIC ONLY Blood Culture results may not be optimal due to an inadequate volume of blood received in culture bottles   Culture   Final    NO GROWTH 1 DAY Performed at [facility], [address] [place], [place] [address]    Report Status PENDING  Incomplete      Studies: US Abdomen Limited  Result Date: 03/11/2020 CLINICAL DATA:  59 year old female with cirrhosis. EXAM: ULTRASOUND ABDOMEN LIMITED RIGHT UPPER QUADRANT COMPARISON:  Right upper quadrant ultrasound dated 03/01/2017. FINDINGS: Gallbladder: There are multiple stones within the gallbladder. The gallbladder wall is thickened and edematous measuring approximately 4 mm. Small  pericholecystic fluid noted. These findings may be related to underlying liver disease. Clinical correlation is recommended. Negative sonographic Murphy's sign. Common bile duct: Diameter: 5 mm Liver: Morphologic changes of cirrhosis. No discrete mass identified. Portal vein is patent on color Doppler imaging with normal direction of blood flow towards the liver. Other: Small ascites. IMPRESSION: 1. Cirrhosis. 2. Cholelithiasis with equivocal sonographic findings for acute cholecystitis. A hepatobiliary scintigraphy may provide better evaluation of the gallbladder if there is a high clinical concern for acute cholecystitis . 3. Patent main portal vein with hepatopetal flow. 4. Small ascites. Electronically Signed   By: Elgie CollardArash  Radparvar M.D.   On: 03/11/2020 15:11    Scheduled Meds: . bisacodyl  10 mg Rectal Once  . Chlorhexidine Gluconate Cloth  6 each Topical Daily  . docusate sodium  200 mg Oral BID  . folic acid  1 mg Oral Daily  . lactulose  20 g Oral BID  .  morphine injection  2 mg Intravenous Once  . multivitamin with minerals  1 tablet Oral Daily  . [START ON 03/13/2020] pantoprazole  40 mg Oral Q0600  . rifaximin  550 mg Oral BID  . sodium chloride flush  10-40 mL Intracatheter Q12H  . thiamine  100 mg Oral Daily    Continuous Infusions: . sodium chloride    . sodium chloride 10 mL/hr at 03/12/20 0600  . cefTRIAXone (ROCEPHIN)  IV Stopped (03/11/20 2141)  . dextrose 5% lactated ringers Stopped (03/11/20 1519)  . octreotide  (SANDOSTATIN)    IV infusion 50 mcg/hr (03/12/20 0600)  . pantoprozole (PROTONIX) infusion 8 mg/hr (03/12/20 0600)     LOS: 3 days     Hollice EspySendil K Savannah Erbe, MD [facility]   03/12/2020, 1:18 PM

## 2020-03-12 NOTE — Progress Notes (Addendum)
Daily Rounding Note  03/12/2020, 10:57 AM  LOS: 3 days   SUBJECTIVE:   Chief complaint: Anemia.  Cirrhosis.  Esophageal varices.  Pt c/o of belly feeling firm, no pain w this.  No BM for at least a few days  OBJECTIVE:         Vital signs in last 24 hours:    Temp:  [98.1 F (36.7 C)-99 F (37.2 C)] 98.5 F (36.9 C) (12/28 0706) Pulse Rate:  [72-98] 72 (12/28 0400) Resp:  [11-21] 19 (12/28 0800) BP: (96-122)/(57-75) 122/57 (12/28 0800) SpO2:  [91 %-98 %] 98 % (12/28 0800) Arterial Line BP: (119-142)/(59-71) 132/65 (12/27 1400) Weight:  [46.1 kg] 46.1 kg (12/28 0500) Last BM Date: 03/10/20 Filed Weights   03/10/20 0820 03/11/20 0600 03/12/20 0500  Weight: 41.7 kg 46.1 kg 46.1 kg   General: frail, looks chronically unwell.  Comfortable and alert   Heart: RRR, rate in 70s Chest: clear bil.  No cough or dyspnea Abdomen: moderately firm, slightly protuberant.  NT.  Active BS  Extremities: no CCE.  + sarcopenia Derm: telangectasia on upper trunk Neuro/Psych:  Oriented x 3.  No tremor or asterixis.  Moves all 4 limbs.  Speech fluid.  Calm.    Intake/Output from previous day: 12/27 0701 - 12/28 0700 In: 1872.6 [I.V.:1096.5; IV Piggyback:776.2] Out: 100 [Urine:100]  Intake/Output this shift: Total I/O In: 298.9 [P.O.:120; I.V.:178.9] Out: 500 [Urine:500]  Lab Results: Recent Labs    03/11/20 0900 03/11/20 1830 03/12/20 0512  WBC 4.3 4.6 4.3  HGB 7.9* 8.6* 8.1*  HCT 22.3* 25.3* 24.2*  PLT PLATELET CLUMPS NOTED ON SMEAR, COUNT APPEARS DECREASED 36* 42*   BMET Recent Labs    03/10/20 0521 03/11/20 0700 03/12/20 0512  NA 138 138 134*  K 3.8 3.3* 3.5  CL 103 105 104  CO2 23 23 23   GLUCOSE 127* 118* 98  BUN 19 13 8   CREATININE 0.69 0.59 0.52  CALCIUM 8.1* 8.3* 8.1*   LFT Recent Labs    03/10/20 0154 03/10/20 0521 03/11/20 0700  PROT 5.4* 5.0* 5.1*  ALBUMIN 2.6* 2.5* 2.4*  AST 145* 155* 164*   ALT 46* 46* 48*  ALKPHOS 39 41 41  BILITOT 9.9* 9.9* 7.6*   PT/INR Recent Labs    03/09/20 1748  LABPROT 20.8*  INR 1.9*   Hepatitis Panel No results for input(s): HEPBSAG, HCVAB, HEPAIGM, HEPBIGM in the last 72 hours.  Studies/Results: 03/13/20 Abdomen Limited  Result Date: 03/11/2020 CLINICAL DATA:  59 year old female with cirrhosis. EXAM: ULTRASOUND ABDOMEN LIMITED RIGHT UPPER QUADRANT COMPARISON:  Right upper quadrant ultrasound dated 03/01/2017. FINDINGS: Gallbladder: There are multiple stones within the gallbladder. The gallbladder wall is thickened and edematous measuring approximately 4 mm. Small pericholecystic fluid noted. These findings may be related to underlying liver disease. Clinical correlation is recommended. Negative sonographic Murphy's sign. Common bile duct: Diameter: 5 mm Liver: Morphologic changes of cirrhosis. No discrete mass identified. Portal vein is patent on color Doppler imaging with normal direction of blood flow towards the liver. Other: Small ascites. IMPRESSION: 1. Cirrhosis. 2. Cholelithiasis with equivocal sonographic findings for acute cholecystitis. A hepatobiliary scintigraphy may provide better evaluation of the gallbladder if there is a high clinical concern for acute cholecystitis . 3. Patent main portal vein with hepatopetal flow. 4. Small ascites. Electronically Signed   By: 77 M.D.   On: 03/11/2020 15:11   ECHOCARDIOGRAM COMPLETE  Result Date: 03/10/2020 IMPRESSIONS  1.  Left ventricular ejection fraction, by estimation, is 70 to 75%. The left ventricle has hyperdynamic function. The left ventricle has no regional wall motion abnormalities. Left ventricular diastolic parameters are consistent with Grade I diastolic dysfunction (impaired relaxation).  2. Right ventricular systolic function is hyperdynamic. The right ventricular size is normal. There is normal pulmonary artery systolic pressure.  3. Left atrial size was mildly dilated.  4.  Right atrial size was mildly dilated.  5. The mitral valve is normal in structure. No evidence of mitral valve regurgitation. No evidence of mitral stenosis.  6. The aortic valve is tricuspid. There is mild calcification of the aortic valve. Aortic valve regurgitation is not visualized. Mild to moderate aortic valve sclerosis/calcification is present, without any evidence of aortic stenosis.  7. The inferior vena cava is normal in size with greater than 50% respiratory variability, suggesting right atrial pressure of 3 mmHg.  Electronically signed by Arvilla Meres MD Signature Date/Time: 03/10/2020/8:22:55 PM    Final     ASSESMENT:    *   Melena, Hematemesis Previous small varices on EGD 06/2017.  Did not tolerate propanolol 12/26 EGD: Blood in the esophagus actively bleeding at GEJ, appearance not typical for variceal bleeding but moderate esophageal varices close by so suspect variceal bleed.  Varices treated with 2 hemoclips and 3 bands.  Small hiatal hernia.  Blood in the stomach.  No gastric varices or gastric pathology seen after gastric lavage. Day 3 octreotide and Protonix drip.  Rocephin day 4  *    Decompensated cirrhosis, autoimmune hepatitis and (new discovery) ETOH abuse.   Jaundice with transaminase pattern consistent with alcoholic liver disease/hepatitis MELD-NA 21 at arrival. Cirrhosis diagnosed 08/2015.  Lost to GI follow-up after April 2019.  Last office visit with [facility] transplant/hepatology clinic 08/2017 Previous treatment with prednisone.  Azathioprine discontinued due to alopecia. Currently no AIH meds other than "essential oils". Ultrasound 12/27 showing cirrhosis, cholelithiasis with equivocal findings (GB wall edema/thickening with small volume pericholecystic fluid) for acute cholecystitis vs sequela of underlying liver disease.  Normal portal vein with hepatopedal flow.  Small volume ascites.  CBD 5 mm AFP collected today, pending No sxs to suggest  cholecystitis.    *   Blood loss anemia.  Hgb 5.3 >> PRBC x 2 >> 8.6 >> 7.9 >> 8.1. MCV 109.  *   Thrombocytopenia.  Platelets stable in the 40s. Platelets  X 1 u  *    Coagulopathy. S/p Vit K.     PLAN   *   Added BID lactulose.  Will give dulcolax PR x 1.    *    Stop Rocephin after finishing 5 days.  *   Will finish 72 h of Octreotide and PPI drips today at 1800.  Continue Protonix 40 mg po daily after that.    *    INR in morning.  Weight pending AFP.  *   No plans for HIDA scan, as this would not be a reliable test with bilirubin of 7.6 and pt not having sxs of biliary colic/cholecystitis.         Michelle Schneider  03/12/2020, 10:57 AM Phone [phone]    Attending physician's note   I have taken an interval history, reviewed the chart and examined the patient. I agree with the Advanced Practitioner's note, impression and recommendations.   No further bleeding.  US-cirrhosis.  No liver masses.  Minimal ascites.  Decompensated liver cirrhosis d/t AIH/ETOH.  Eso varices s/p EVL  12/26.  Intolerant to beta-blockers. UGI bleed d/t MW tear s/p endoscopic clipping.  No further bleeding. Hepatic encephalopathy, likely exacerbated by UGI bleed. H/O ascites-only minimal ascites on ultrasound yesterday  Plan: -Lactulose/rifaximin.  Titrate lactulose to 1-2 BMs/day -Can stop octreotide today.  Continue Protonix 40 mg p.o. thereafter.  -Discontinue Rocephin after 5 days. -Check AFP in AM. -Strict alcohol abstinence.  I have discussed with pt. -Repeat EGD with EVL in 4-6 weeks. -Avoid nonsteroidals. -FU with Dr Adela Lank as outpt -Also FU with [facility]) as outpt. -Appreciate Wendi's (case manager) input regarding discharge. -Will sign off for now.    Edman Circle, MD [facility] [phone]

## 2020-03-13 DIAGNOSIS — K7460 Unspecified cirrhosis of liver: Secondary | ICD-10-CM | POA: Diagnosis not present

## 2020-03-13 DIAGNOSIS — R578 Other shock: Secondary | ICD-10-CM | POA: Diagnosis not present

## 2020-03-13 DIAGNOSIS — K922 Gastrointestinal hemorrhage, unspecified: Secondary | ICD-10-CM | POA: Diagnosis not present

## 2020-03-13 DIAGNOSIS — K754 Autoimmune hepatitis: Secondary | ICD-10-CM | POA: Diagnosis not present

## 2020-03-13 LAB — CBC
HCT: 24.3 % — ABNORMAL LOW (ref 36.0–46.0)
Hemoglobin: 8.5 g/dL — ABNORMAL LOW (ref 12.0–15.0)
MCH: 33.1 pg (ref 26.0–34.0)
MCHC: 35 g/dL (ref 30.0–36.0)
MCV: 94.6 fL (ref 80.0–100.0)
Platelets: 39 10*3/uL — ABNORMAL LOW (ref 150–400)
RBC: 2.57 MIL/uL — ABNORMAL LOW (ref 3.87–5.11)
RDW: 19.2 % — ABNORMAL HIGH (ref 11.5–15.5)
WBC: 3.6 10*3/uL — ABNORMAL LOW (ref 4.0–10.5)
nRBC: 1.1 % — ABNORMAL HIGH (ref 0.0–0.2)

## 2020-03-13 LAB — COMPREHENSIVE METABOLIC PANEL
ALT: 53 U/L — ABNORMAL HIGH (ref 0–44)
AST: 152 U/L — ABNORMAL HIGH (ref 15–41)
Albumin: 2.3 g/dL — ABNORMAL LOW (ref 3.5–5.0)
Alkaline Phosphatase: 51 U/L (ref 38–126)
Anion gap: 8 (ref 5–15)
BUN: 5 mg/dL — ABNORMAL LOW (ref 8–23)
CO2: 23 mmol/L (ref 22–32)
Calcium: 8.2 mg/dL — ABNORMAL LOW (ref 8.9–10.3)
Chloride: 102 mmol/L (ref 98–111)
Creatinine, Ser: 0.5 mg/dL (ref 0.44–1.00)
GFR, Estimated: >60 mL/min (ref >60)
Glucose, Bld: 90 mg/dL (ref 70–99)
Potassium: 3.6 mmol/L (ref 3.5–5.1)
Sodium: 133 mmol/L — ABNORMAL LOW (ref 135–145)
Total Bilirubin: 9.5 mg/dL — ABNORMAL HIGH (ref 0.3–1.2)
Total Protein: 5.3 g/dL — ABNORMAL LOW (ref 6.5–8.1)

## 2020-03-13 LAB — AFP TUMOR MARKER: AFP, Serum, Tumor Marker: 13.1 ng/mL — ABNORMAL HIGH (ref 0.0–8.3)

## 2020-03-13 LAB — PROTIME-INR
INR: 2.1 — ABNORMAL HIGH (ref 0.8–1.2)
Prothrombin Time: 23.1 seconds — ABNORMAL HIGH (ref 11.4–15.2)

## 2020-03-13 MED ORDER — ENSURE ENLIVE PO LIQD
237.0000 mL | Freq: Two times a day (BID) | ORAL | Status: DC
  Administered 2020-03-14: 237 mL via ORAL

## 2020-03-13 MED ORDER — LACTULOSE 10 GM/15ML PO SOLN
30.0000 g | Freq: Two times a day (BID) | ORAL | Status: DC
  Administered 2020-03-13 – 2020-03-14 (×2): 30 g via ORAL
  Filled 2020-03-13: qty 45

## 2020-03-13 NOTE — Progress Notes (Signed)
Initial Nutrition Assessment  DOCUMENTATION CODES:   Non-severe (moderate) malnutrition in context of chronic illness  INTERVENTION:    Ensure Enlive po BID, each supplement provides 350 kcal and 20 grams of protein  MVI daily  NUTRITION DIAGNOSIS:   Moderate Malnutrition related to chronic illness (cirrhosis) as evidenced by moderate fat depletion,severe fat depletion.  GOAL:   Patient will meet greater than or equal to 90% of their needs  MONITOR:   Supplement acceptance,PO intake,Labs,Weight trends,I & O's  REASON FOR ASSESSMENT:   Consult Assessment of nutrition requirement/status  ASSESSMENT:   Patient with PMH significant for autoimmune hepatitis causing 2/2 cirrhosis and tertiary varices. Presents this admission with upper GI bleed.   12/26- EGD, esophageal varices, bleeding GE junction, s/p clips x2  Pt denies loss in appetite PTA. States she typically eats three meals daily that consist of B- toast, fruit L- sandwich or salad D- salad with chicken or salmon. Drinks one Ensure daily. Appetite progressing this admission. Last three meal completions charted as 100%. Was sent bacon this am and requests for diet to be change to low sodium as this is what she follows at home. Discussed the importance of protein intake for preservation of lean body mass. Pt willing to try two Ensures daily.   Pt endorses a UBW of 92-96 lb and denies recent weight loss.  States the highest weight she has been at was around 105 lb (in high school).  Records indicate pt weight have increase since 10/12. Suspect fluid may be masking losses. Abdomen distended and tight. Complaining of no BM since yesterday, lactulose given.   UOP: 1240 ml x 24 hrs   Medications: colace, folic acid, lactulose, thiamine  Labs: Na 133 (L) LFTs trending down  NUTRITION - FOCUSED PHYSICAL EXAM:  Flowsheet Row Most Recent Value  Orbital Region Mild depletion  Upper Arm Region Moderate depletion  Thoracic and  Lumbar Region Unable to assess  Buccal Region Moderate depletion  Temple Region Severe depletion  Clavicle Bone Region Severe depletion  Clavicle and Acromion Bone Region Severe depletion  Scapular Bone Region Unable to assess  Dorsal Hand Mild depletion  Patellar Region Severe depletion  Anterior Thigh Region Severe depletion  Posterior Calf Region Severe depletion  Edema (RD Assessment) Moderate  [abdomen]  Hair Reviewed  Eyes Reviewed  Mouth Reviewed  Skin Reviewed  Nails Reviewed      Diet Order:   Diet Order            Diet 2 gram sodium Room service appropriate? Yes; Fluid consistency: Thin  Diet effective now                 EDUCATION NEEDS:   Education needs have been addressed  Skin:  Skin Assessment: Reviewed RN Assessment  Last BM:  12/28  Height:   Ht Readings from Last 1 Encounters:  03/10/20 5\' 2"  (1.575 m)    Weight:   Wt Readings from Last 1 Encounters:  03/13/20 48.5 kg    BMI:  Body mass index is 19.55 kg/m.  Estimated Nutritional Needs:   Kcal:  1500-1700 kcal  Protein:  75-90 grams  Fluid:  >/= 1.5 L/day  03/15/20 RD, LDN Clinical Nutrition Pager listed in AMION

## 2020-03-13 NOTE — Evaluation (Signed)
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  Patient states that this is not happened before.  Clinical Impression  Pt admitted with above diagnosis. Pt was able to ambulate with RW in room with min guard to mmin assist needing cues for safety and steadying at times. Should progress well.  Has to care for brother at home therefore needs to be Modif I to go home.  Pt currently with functional limitations due to the deficits listed below (see PT Problem List). Pt will benefit from skilled PT to increase their independence and safety with mobility to allow discharge to the venue listed below.     Follow Up Recommendations Home health PT    Equipment Recommendations  Rolling walker with 5" wheels;3in1 (PT) (brother has one but she may need her own)    Recommendations for Other Services       Precautions / Restrictions Precautions Precautions: Fall Restrictions Weight Bearing Restrictions: No      Mobility  Bed Mobility Overal bed mobility: Independent             General bed mobility comments: Incr time due to abdominal pain    Transfers Overall transfer level: Needs assistance Equipment used: Rolling walker (2 wheeled) Transfers: Sit to/from Stand Sit to Stand: Min assist         General transfer comment: Posterior lean initially needing steadying assist.  Ambulation/Gait Ambulation/Gait assistance: Min guard;Min assist Gait Distance (Feet): 50 Feet Assistive device: Rolling walker (2 wheeled) Gait Pattern/deviations: Step-through pattern;Decreased stride length;Trunk flexed;Drifts right/left   Gait velocity interpretation: <1.31 ft/sec, indicative  of household ambulator General Gait Details: walked to door and back to bed. Slow gait and needed cues to stand tall and to stay close to rw and some assist to steer RW as well.  Stairs            Wheelchair Mobility    Modified Rankin (Stroke Patients Only)       Balance Overall balance assessment: Needs assistance Sitting-balance support: No upper extremity supported;Feet supported Sitting balance-Leahy Scale: Fair     Standing balance support: Bilateral upper extremity supported;During functional activity Standing balance-Leahy Scale: Poor Standing balance comment: relies on RW for support                             Pertinent Vitals/Pain Pain Assessment: No/denies pain    Home Living Family/patient expects to be discharged to:: Private residence Living Arrangements: Other relatives (helps take care of brother) Available Help at Discharge: Family;Available PRN/intermittently (5 days a week, 6- 8 hours day) Type of Home: House Home Access: Stairs to enter Entrance Stairs-Rails: Left;Right;Can reach both Entrance Stairs-Number of Steps: 2 Home Layout: Two level Home Equipment: Walker - 2 wheels;Cane - single point;Shower seat Additional Comments: brother can stand pivot with RW to chair    Prior Function Level of Independence: Independent         Comments: Pt assist to get brother into his chair, cooks his breakfast and helps him shower when caregiver isnt there.     Hand Dominance   Dominant Hand: Right    Extremity/Trunk Assessment   Upper Extremity Assessment Upper Extremity Assessment: Defer  to OT evaluation    Lower Extremity Assessment Lower Extremity Assessment: Generalized weakness    Cervical / Trunk Assessment Cervical / Trunk Assessment: Normal  Communication   Communication: No difficulties  Cognition Arousal/Alertness: Awake/alert Behavior During Therapy: WFL for tasks assessed/performed Overall Cognitive Status: Within  Functional Limits for tasks assessed                                        General Comments      Exercises     Assessment/Plan    PT Assessment Patient needs continued PT services  PT Problem List Decreased activity tolerance;Decreased balance;Decreased mobility;Decreased knowledge of use of DME;Decreased safety awareness;Decreased knowledge of precautions;Cardiopulmonary status limiting activity;Pain       PT Treatment Interventions DME instruction;Gait training;Functional mobility training;Therapeutic activities;Therapeutic exercise;Balance training;Patient/family education    PT Goals (Current goals can be found in the Care Plan section)  Acute Rehab PT Goals Patient Stated Goal: to go home and care for brother PT Goal Formulation: With patient Time For Goal Achievement: 03/27/20 Potential to Achieve Goals: Good    Frequency Min 3X/week   Barriers to discharge        Co-evaluation               AM-PAC PT "6 Clicks" Mobility  Outcome Measure Help needed turning from your back to your side while in a flat bed without using bedrails?: None Help needed moving from lying on your back to sitting on the side of a flat bed without using bedrails?: None Help needed moving to and from a bed to a chair (including a wheelchair)?: A Little Help needed standing up from a chair using your arms (e.g., wheelchair or bedside chair)?: A Little Help needed to walk in hospital room?: A Little Help needed climbing 3-5 steps with a railing? : A Little 6 Click Score: 20    End of Session Equipment Utilized During Treatment: Gait belt Activity Tolerance: Patient limited by fatigue;Patient limited by pain Patient left: in bed;with bed alarm set;with call bell/phone within reach Nurse Communication: Mobility status PT Visit Diagnosis: Muscle weakness (generalized) (M62.81)    Time: 1257-1310 PT Time Calculation (min) (ACUTE ONLY): 13 min   Charges:   PT  Evaluation $PT Eval Moderate Complexity: 1 Mod          Teniola Tseng W,PT Acute Rehabilitation Services Pager:  [phone]  Office:  [phone]    Berline Lopes 03/13/2020, 2:49 PM

## 2020-03-13 NOTE — Progress Notes (Signed)
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  Patient states that this is not happened before.  She has no previous history of aspirin use H. pylori or ulcers.  In the emergency room, she had witnessed hematemesis and lab work noted a platelet count of 19, INR of 1.9 and hemoglobin of 8.1 (down from previous 9.3).  Patient was admitted to the hospitalist service and transfused 2 units of blood plus platelets.  GI consulted.    Found to be in hemorrhagic shock.  Patient placed in ICU.  Underwent endoscopy on 12/26 with esophageal varices and bleeding noted at the GE junction which was banded with clips x2 placed.  Bleeding and shock stabilized.  Ultrasound of the abdomen done on 12/27 also noted questionable acute cholecystitis (ordered as patient was complaining of abdominal pain).    Hospitalists assumed care 12/28.  Patient doing okay.  Octreotide and Protonix drips able to be weaned off.  Only complaint is not having a bowel movement yet.  Laxatives have caused cramping.  Started on lactulose on 12/28 and with no response yet.  Assessment/Plan: Principal Problem:   Hemorrhagic shock (HCC) secondary to upper GI bleed from esophageal varices: Status post 2 unit packed red blood blood cell transfusion.  Patient has underlying thrombocytopenia from liver disease.  Status post endoscopy with banding and clips placed at GE junction.  For now, appears to be stable.  Protonix and octreotide discontinued.  GI signed off.  Active Problems:   Autoimmune hepatitis (HCC) causing secondary cirrhosis, esophageal varices and coagulopathy and thrombocytopenia: Monitor for further bleeding.  Patient diagnosed in 2017  and at that time had a positive ANA IgG and smooth muscle antibodies.  She had been on prednisone at some point.  Was transitioned to azathioprine which she did not tolerate due to side effects.  She stopped using Imuran as well.  Status post transfusion of platelets.  Did note AFP level elevated.    Severe protein-calorie malnutrition (HCC): Nutrition to see.  BMI of 18.6.  Poor p.o. intake    Constipation: Patient states that she has not been able to go now for several days.  Feels like is very hard in her belly.  Tried mild laxatives on 12/27 which only caused abdominal cramping.  GI started lactulose.  No response yet.  Questionable acute cholecystitis: No plans for HIDA scan as this would not be a reliable test given patient's elevated bilirubin.  She was self not having right upper quadrant abdominal pain she is not having pain associated with eating  Chronic diastolic heart failure: Monitor and will check BNP.  Lactic acidosis: Felt to be secondary to intravascular volume depletion.  No evidence of sepsis.  Sepsis ruled out.  Code Status: Full code  Family Communication: Left message for family  Disposition Plan: Anticipate discharge 12/30, hopefully bowels have moved by then.  Seen by PT who recommended home health  Consultants:  Gastroenterology  Critical care  Procedures:  Echocardiogram done 12/26: Diastolic heart failure, preserved ejection fraction  Endoscopy done 12/26  Status post 2 units packed red blood cell transfusion  Status post sixpack platelet transfusion  Antimicrobials:  IV Rocephin for SBP prophylaxis  DVT prophylaxis: SCDs   Objective: Vitals:  03/13/20 0747 03/13/20 1156  BP: 99/73 116/79  Pulse: 78 87  Resp: 20 20  Temp: 98.7 F (37.1 C) 98 F (36.7 C)  SpO2: 92% 93%    Intake/Output Summary (Last 24 hours) at 03/13/2020 1511 Last data filed at 03/13/2020 1100 Gross per 24 hour  Intake 388.96 ml  Output 340 ml  Net 48.96 ml    Filed Weights   03/11/20 0600 03/12/20 0500 03/13/20 0500  Weight: 46.1 kg 46.1 kg 48.5 kg   Body mass index is 19.55 kg/m.  Exam:   General: Alert and oriented x3, fatigued and mild distress from constipation  HEENT: Normocephalic and atraumatic, mucous membrane slightly dry.  Sclera icteric  Cardiovascular: Regular rate and rhythm, S1-S2  Respiratory: Clear to auscultation bilaterally  Abdomen: Soft, mildly distended, mild nonspecific tenderness, hypoactive bowel sounds  Musculoskeletal: No clubbing or cyanosis, trace pitting edema  Skin: Jaundiced  Psychiatry: Appropriate, no evidence of psychoses   Data Reviewed: CBC: Recent Labs  Lab 03/09/20 1748 03/09/20 1802 03/10/20 2318 03/11/20 0900 03/11/20 1830 03/12/20 0512 03/13/20 0136  WBC 4.4   < > 5.6 4.3 4.6 4.3 3.6*  NEUTROABS 3.1  --   --   --   --   --   --   HGB 8.1*   < > 7.8* 7.9* 8.6* 8.1* 8.5*  HCT 24.5*   < > 22.6* 22.3* 25.3* 24.2* 24.3*  MCV 104.7*   < > 92.2 92.1 93.4 94.9 94.6  PLT 19*   < > 41* PLATELET CLUMPS NOTED ON SMEAR, COUNT APPEARS DECREASED 36* 42* 39*   < > = values in this interval not displayed.   Basic Metabolic Panel: Recent Labs  Lab 03/09/20 1748 03/09/20 1802 03/10/20 0154 03/10/20 0521 03/11/20 0700 03/12/20 0512 03/13/20 0136  NA 135   < > 139 138 138 134* 133*  K 3.0*   < > 3.5 3.8 3.3* 3.5 3.6  CL 95*   < > 103 103 105 104 102  CO2 20*  --  18* 23 23 23 23   GLUCOSE 108*   < > 115* 127* 118* 98 90  BUN 19   < > 20 19 13 8  5*  CREATININE 0.77   < > 0.75 0.69 0.59 0.52 0.50  CALCIUM 8.9  --  8.1* 8.1* 8.3* 8.1* 8.2*  MG 1.7  --   --  1.5* 1.8 1.8  --   PHOS  --   --   --  3.0 1.7* 2.5  --    < > = values in this interval not displayed.   GFR: Estimated Creatinine Clearance: 56.5 mL/min (by C-G formula based on SCr of 0.5 mg/dL). Liver Function Tests: Recent Labs  Lab 03/09/20 1748 03/10/20 0154 03/10/20 0521 03/11/20 0700 03/13/20 0136  AST 148* 145*  155* 164* 152*  ALT 49* 46* 46* 48* 53*  ALKPHOS 52 39 41 41 51  BILITOT 7.8* 9.9* 9.9* 7.6* 9.5*  PROT 6.6 5.4* 5.0* 5.1* 5.3*  ALBUMIN 3.1* 2.6* 2.5* 2.4* 2.3*   Recent Labs  Lab 03/09/20 1748 03/09/20 2256  LIPASE 96* 65*   Recent Labs  Lab 03/10/20 0202  AMMONIA 65*   Coagulation Profile: Recent Labs  Lab 03/09/20 1748 03/13/20 0136  INR 1.9* 2.1*   Cardiac Enzymes: No results for input(s): CKTOTAL, CKMB, CKMBINDEX, TROPONINI in the last 168 hours. BNP (last 3 results) No results for input(s): PROBNP in the last 8760 hours. HbA1C: No results for input(s): HGBA1C in  the last 72 hours. CBG: Recent Labs  Lab 03/09/20 2344  GLUCAP 73   Lipid Profile: No results for input(s): CHOL, HDL, LDLCALC, TRIG, CHOLHDL, LDLDIRECT in the last 72 hours. Thyroid Function Tests: No results for input(s): TSH, T4TOTAL, FREET4, T3FREE, THYROIDAB in the last 72 hours. Anemia Panel: No results for input(s): VITAMINB12, FOLATE, FERRITIN, TIBC, IRON, RETICCTPCT in the last 72 hours. Urine analysis:    Component Value Date/Time   COLORURINE AMBER (A) 03/10/2020 0708   APPEARANCEUR HAZY (A) 03/10/2020 0708   LABSPEC 1.026 03/10/2020 0708   PHURINE 6.0 03/10/2020 0708   GLUCOSEU NEGATIVE 03/10/2020 0708   HGBUR LARGE (A) 03/10/2020 0708   BILIRUBINUR SMALL (A) 03/10/2020 0708   KETONESUR 80 (A) 03/10/2020 0708   PROTEINUR NEGATIVE 03/10/2020 0708   NITRITE NEGATIVE 03/10/2020 0708   LEUKOCYTESUR NEGATIVE 03/10/2020 0708   Sepsis Labs: @LABRCNTIP (procalcitonin:4,lacticidven:4)  ) Recent Results (from the past 240 hour(s))  Resp Panel by RT-PCR (Flu A&B, Covid) Nasopharyngeal Swab     Status: None   Collection Time: 03/09/20  5:48 PM   Specimen: Nasopharyngeal Swab; Nasopharyngeal(NP) swabs in vial transport medium  Result Value Ref Range Status   SARS Coronavirus 2 by RT PCR NEGATIVE NEGATIVE Final    Comment: (NOTE) SARS-CoV-2 target nucleic acids are NOT DETECTED.  The  SARS-CoV-2 RNA is generally detectable in upper respiratory specimens during the acute phase of infection. The lowest concentration of SARS-CoV-2 viral copies this assay can detect is 138 copies/mL. A negative result does not preclude SARS-Cov-2 infection and should not be used as the sole basis for treatment or other patient management decisions. A negative result may occur with  improper specimen collection/handling, submission of specimen other than nasopharyngeal swab, presence of viral mutation(s) within the areas targeted by this assay, and inadequate number of viral copies(<138 copies/mL). A negative result must be combined with clinical observations, patient history, and epidemiological information. The expected result is Negative.  Fact Sheet for Patients:  03/11/20  Fact Sheet for Healthcare Providers:  BloggerCourse.com  This test is no t yet approved or cleared by the [employer] FDA and  has been authorized for detection and/or diagnosis of SARS-CoV-2 by FDA under an Emergency Use Authorization (EUA). This EUA will remain  in effect (meaning this test can be used) for the duration of the COVID-19 declaration under Section 564(b)(1) of the Act, 21 U.S.C.section 360bbb-3(b)(1), unless the authorization is terminated  or revoked sooner.       Influenza A by PCR NEGATIVE NEGATIVE Final   Influenza B by PCR NEGATIVE NEGATIVE Final    Comment: (NOTE) The Xpert Xpress SARS-CoV-2/FLU/RSV plus assay is intended as an aid in the diagnosis of influenza from Nasopharyngeal swab specimens and should not be used as a sole basis for treatment. Nasal washings and aspirates are unacceptable for Xpert Xpress SARS-CoV-2/FLU/RSV testing.  Fact Sheet for Patients: [place]  Fact Sheet for Healthcare Providers: BloggerCourse.com  This test is not yet approved or  cleared by the [employer] FDA and has been authorized for detection and/or diagnosis of SARS-CoV-2 by FDA under an Emergency Use Authorization (EUA). This EUA will remain in effect (meaning this test can be used) for the duration of the COVID-19 declaration under Section 564(b)(1) of the Act, 21 U.S.C. section 360bbb-3(b)(1), unless the authorization is terminated or revoked.  Performed at [facility], [address] [place], [place]   MRSA PCR Screening     Status: None   Collection  Time: 03/10/20 12:16 AM   Specimen: Nasal Mucosa; Nasopharyngeal  Result Value Ref Range Status   MRSA by PCR NEGATIVE NEGATIVE Final    Comment:        The GeneXpert MRSA Assay (FDA approved for NASAL specimens only), is one component of a comprehensive MRSA colonization surveillance program. It is not intended to diagnose MRSA infection nor to guide or monitor treatment for MRSA infections. Performed at [facility], [address] [place], [place] [address]   Culture, blood (routine x 2)     Status: None (Preliminary result)   Collection Time: 03/10/20  5:43 AM   Specimen: BLOOD LEFT HAND  Result Value Ref Range Status   Specimen Description BLOOD LEFT HAND  Final   Special Requests   Final    BOTTLES DRAWN AEROBIC AND ANAEROBIC Blood Culture adequate volume   Culture   Final    NO GROWTH 3 DAYS Performed at [facility], [address] [place], [place] [address]    Report Status PENDING  Incomplete  Culture, blood (routine x 2)     Status: None (Preliminary result)   Collection Time: 03/10/20  5:43 AM   Specimen: BLOOD RIGHT HAND  Result Value Ref Range Status   Specimen Description BLOOD RIGHT HAND  Final   Special Requests   Final    BOTTLES DRAWN AEROBIC ONLY Blood Culture results may not be optimal due to an inadequate volume of blood received in culture bottles   Culture   Final    NO GROWTH 3 DAYS Performed at [facility], [address] [place], [place] [address]    Report Status PENDING  Incomplete      Studies: No results found.  Scheduled Meds: . Chlorhexidine Gluconate Cloth  6 each Topical Daily  . docusate sodium  200 mg Oral BID  . folic acid  1 mg Oral Daily  . lactulose  30 g Oral BID  . multivitamin with minerals  1 tablet Oral Daily  . pantoprazole  40 mg Oral Q0600  . rifaximin  550 mg Oral BID  . sodium chloride flush  10-40 mL Intracatheter Q12H  . thiamine  100 mg Oral Daily    Continuous Infusions: . sodium chloride    . sodium chloride Stopped (03/12/20 1747)  . cefTRIAXone (ROCEPHIN)  IV Stopped (03/12/20 1719)  . dextrose 5% lactated ringers Stopped (03/11/20 1519)     LOS: 4 days     Hollice EspySendil K Icy Fuhrmann, MD [facility]   03/13/2020, 3:11 PM

## 2020-03-14 ENCOUNTER — Inpatient Hospital Stay (HOSPITAL_COMMUNITY): Payer: No Typology Code available for payment source

## 2020-03-14 DIAGNOSIS — K922 Gastrointestinal hemorrhage, unspecified: Secondary | ICD-10-CM | POA: Diagnosis not present

## 2020-03-14 DIAGNOSIS — K754 Autoimmune hepatitis: Secondary | ICD-10-CM | POA: Diagnosis not present

## 2020-03-14 DIAGNOSIS — K5900 Constipation, unspecified: Secondary | ICD-10-CM | POA: Diagnosis not present

## 2020-03-14 DIAGNOSIS — R578 Other shock: Secondary | ICD-10-CM | POA: Diagnosis not present

## 2020-03-14 DIAGNOSIS — E440 Moderate protein-calorie malnutrition: Secondary | ICD-10-CM | POA: Insufficient documentation

## 2020-03-14 MED ORDER — TRAMADOL HCL 50 MG PO TABS
50.0000 mg | ORAL_TABLET | Freq: Four times a day (QID) | ORAL | 0 refills | Status: AC | PRN
Start: 2020-03-14 — End: ?

## 2020-03-14 MED ORDER — THIAMINE HCL 100 MG PO TABS
100.0000 mg | ORAL_TABLET | Freq: Every day | ORAL | 1 refills | Status: AC
Start: 2020-03-15 — End: ?

## 2020-03-14 MED ORDER — DOCUSATE SODIUM 100 MG PO CAPS: 100.0000 mg | ORAL_CAPSULE | Freq: Two times a day (BID) | ORAL | 1 refills | Status: AC

## 2020-03-14 MED ORDER — ENSURE ENLIVE PO LIQD: 237.0000 mL | Freq: Two times a day (BID) | ORAL | 12 refills | Status: AC

## 2020-03-14 MED ORDER — LACTULOSE 10 GM/15ML PO SOLN: 20.0000 g | Freq: Two times a day (BID) | ORAL | 0 refills | Status: AC

## 2020-03-14 MED ORDER — TRAMADOL HCL 50 MG PO TABS
50.0000 mg | ORAL_TABLET | Freq: Four times a day (QID) | ORAL | Status: DC | PRN
  Administered 2020-03-14: 50 mg via ORAL
  Filled 2020-03-14: qty 1

## 2020-03-14 MED ORDER — RIFAXIMIN 550 MG PO TABS: 550.0000 mg | ORAL_TABLET | Freq: Two times a day (BID) | ORAL | 1 refills | Status: AC

## 2020-03-14 MED ORDER — ADULT MULTIVITAMIN W/MINERALS CH: 1.0000 | ORAL_TABLET | Freq: Every day | ORAL | Status: AC

## 2020-03-14 MED ORDER — FOLIC ACID 1 MG PO TABS: 1.0000 mg | ORAL_TABLET | Freq: Every day | ORAL | 1 refills | Status: AC

## 2020-03-14 NOTE — Discharge Summary (Addendum)
Discharge Summary  Michelle Schneider CXK:[id number] DOB: 1960-09-05  PCP: Patient, No Pcp Per  Admit date: 03/09/2020 Discharge date: 03/14/2020  Time spent: 25 minutes  Recommendations for Outpatient Follow-up:  1. New medication: Lactulose 20 g p.o. twice daily 2. New medication: Rifaximin 500 p.o. twice daily 3. Patient will follow up with Dr. Adela Lank, gastroenterology in 1 month 4. Patient will follow up with her PCP in 2 weeks.  Discharge Diagnoses:  Active Hospital Problems   Diagnosis Date Noted  . Hemorrhagic shock (HCC) 03/09/2020  . Malnutrition of moderate degree 03/14/2020  . Severe protein-calorie malnutrition (HCC) 03/12/2020  . Constipation 03/12/2020  . History of esophageal varices   . Thrombocytopenia (HCC) 03/09/2020  . Hematemesis 03/09/2020  . Acute upper GI bleed 03/09/2020  . Autoimmune hepatitis (HCC) 11/07/2015  . Liver cirrhosis (HCC) 08/24/2015  . Elevated INR 08/24/2015  . Esophageal varices in cirrhosis [facility])     Resolved Hospital Problems  No resolved problems to display.    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  Patient states that this is not happened before.  She has no previous history of aspirin use H. pylori or ulcers.  In the emergency room, she had witnessed hematemesis and lab work noted a platelet count of 19, INR of 1.9 and hemoglobin of 8.1 (down from previous 9.3).  Patient was admitted to the hospitalist service and transfused 2 units of blood plus platelets.   GI consulted.  Hospital Course:  Principal Problem:   Hemorrhagic shock (HCC): Found to be in hemorrhagic shock.  Patient placed in ICU.  Underwent endoscopy on 12/26 with esophageal varices and bleeding noted at the GE junction which was banded with clips x2 placed.  Bleeding and shock stabilized.  Ultrasound of the abdomen done on 12/27 also noted questionable acute cholecystitis (ordered as patient was complaining of abdominal pain).    However, tolerating p.o. and not felt to be gallbladder related.  Protonix and octreotide drips discontinued.  Patient will follow up with her gastroenterologist in 1 month. Active Problems:  Autoimmune hepatitis (HCC) causing secondary cirrhosis, esophageal varices and coagulopathy and thrombocytopenia: Monitor for further bleeding.  Patient diagnosed in 2017 and at that time had a positive ANA IgG and smooth muscle antibodies.  She had been on prednisone at some point.  Was transitioned to azathioprine which she did not tolerate due to side effects.  She stopped using Imuran as well.  Status post transfusion of platelets.  Did note AFP level elevated mildly at 13.5.  Patient will follow up with her gastroenterologist in 1 month.  Patient evaluated by physical therapy who recommended home health PT.    Moderate protein-calorie malnutrition (HCC): BMI of 19.5.  Patient meets criteria as related to her chronic illness of cirrhosis as evidenced by moderate and severe fat depletion.  Seen by nutrition.  Placed on Ensure twice daily plus multivitamin daily.    Constipation: Patient states that she has not been able to go now for several days.  Feels like  is very hard in her belly.  Tried mild laxatives on 12/27 which only caused abdominal cramping.    GI started lactulose and after several days had large bowel movement on the night of 12/29.  Still complaining of some abdominal fullness.  Abdominal x-ray checked on 12/30 which noted no signs of ileus and minimal amount of  stool.  Questionable acute cholecystitis: No plans for HIDA scan as this would not be a reliable test given patient's elevated bilirubin.  She was self not having right upper quadrant abdominal pain she is not having pain associated with eating  Chronic diastolic heart failure: Remains euvolemic  Consultants:  Gastroenterology  Critical care  Procedures:  Echocardiogram done 12/26: Diastolic heart failure, preserved ejection fraction  Endoscopy done 12/26  Status post 2 units packed red blood cell transfusion  Status post sixpack platelet transfusion   Discharge Exam: BP 97/68 (BP Location: Right Arm)   Pulse 90   Temp 98.2 F (36.8 C) (Oral)   Resp 17   Ht 5\' 2"  (1.575 m)   Wt 48.4 kg   SpO2 90%   BMI 19.53 kg/m   General: Alert and oriented x3, no acute distress Cardiovascular: Regular rate and rhythm, S1-S2 Respiratory: Clear to auscultation bilaterally  Discharge Instructions You were cared for by a hospitalist during your hospital stay. If you have any questions about your discharge medications or the care you received while you were in the hospital after you are discharged, you can call the unit and asked to speak with the hospitalist on call if the hospitalist that took care of you is not available. Once you are discharged, your primary care physician will handle any further medical issues. Please note that NO REFILLS for any discharge medications will be authorized once you are discharged, as it is imperative that you return to your primary care physician (or establish a relationship with a primary care physician if you do not have one) for your aftercare needs so that they can reassess your need for medications and monitor your lab values.  Discharge Instructions    Diet - low sodium heart healthy   Complete by: As directed    Increase activity slowly   Complete by: As directed      Allergies as of 03/14/2020      Reactions   Oxycodone-acetaminophen  Nausea And Vomiting, Other (See Comments)   Hallucinations and bad dreams      Medication List    TAKE these medications   docusate sodium 100 MG capsule Commonly known as: COLACE Take 1 capsule (100 mg total) by mouth 2 (two) times daily.   feeding supplement Liqd Take 237 mLs by mouth 2 (two) times daily between meals.   folic acid 1 MG tablet Commonly known as: FOLVITE Take 1 tablet (1 mg total) by mouth daily. Start taking on: March 15, 2020   lactulose 10 GM/15ML solution Commonly known as: CHRONULAC Take 30 mLs (20 g total) by mouth 2 (two) times daily.   Lavender Oil Oil Apply 1 application topically 2 (two) times daily.   Lemon Oil Oil Take 6 drops by mouth daily.   multivitamin with minerals Tabs tablet Take 1 tablet by mouth daily. Start taking on: March 15, 2020   NON FORMULARY Place 2 drops under the tongue daily. CBD Oil Doterra   ORANGE PO Take 5 drops by mouth daily.   OVER THE COUNTER MEDICATION Take 3 tablets by mouth 2 (two) times daily. Doterra Lifelong Vitality Pack  OVER THE COUNTER MEDICATION Apply 1 application topically 2 (two) times daily as needed (essential oil supplement for digestive).   rifaximin 550 MG Tabs tablet Commonly known as: XIFAXAN Take 1 tablet (550 mg total) by mouth 2 (two) times daily.   thiamine 100 MG tablet Take 1 tablet (100 mg total) by mouth daily. Start taking on: March 15, 2020   traMADol 50 MG tablet Commonly known as: ULTRAM Take 1 tablet (50 mg total) by mouth every 6 (six) hours as needed for moderate pain.            Durable Medical Equipment  (From admission, onward)         Start     Ordered   03/14/20 1519  For home use only DME 3 n 1  Once        03/14/20 1518   03/14/20 1518  For home use only DME Walker rolling  Once       Question Answer Comment  Walker: With 5 Inch Wheels   Patient needs a walker to treat with the following condition Cirrhosis of liver (HCC)       03/14/20 1518         Allergies  Allergen Reactions  . Oxycodone-Acetaminophen Nausea And Vomiting and Other (See Comments)    Hallucinations and bad dreams    Follow-up Information    Drazek, Dawn, CRNP Follow up in 2 week(s).   Specialty: Nurse Practitioner Contact information: [address] [place] [address] [phone]        Benancio Deeds, MD. Schedule an appointment as soon as possible for a visit in 1 month(s).   Specialty: Gastroenterology Contact information: [address] [place] [address] [place] [address] [phone]                The results of significant diagnostics from this hospitalization (including imaging, microbiology, ancillary and laboratory) are listed below for reference.    Significant Diagnostic Studies: US Abdomen Limited  Result Date: 03/11/2020 CLINICAL DATA:  59 year old female with cirrhosis. EXAM: ULTRASOUND ABDOMEN LIMITED RIGHT UPPER QUADRANT COMPARISON:  Right upper quadrant ultrasound dated 03/01/2017. FINDINGS: Gallbladder: There are multiple stones within the gallbladder. The gallbladder wall is thickened and edematous measuring approximately 4 mm. Small pericholecystic fluid noted. These findings may be related to underlying liver disease. Clinical correlation is recommended. Negative sonographic Murphy's sign. Common bile duct: Diameter: 5 mm Liver: Morphologic changes of cirrhosis. No discrete mass identified. Portal vein is patent on color Doppler imaging with normal direction of blood flow towards the liver. Other: Small ascites. IMPRESSION: 1. Cirrhosis. 2. Cholelithiasis with equivocal sonographic findings for acute cholecystitis. A hepatobiliary scintigraphy may provide better evaluation of the gallbladder if there is a high clinical concern for acute cholecystitis . 3. Patent main portal vein with hepatopetal flow. 4. Small ascites. Electronically Signed   By: Elgie Collard M.D.   On: 03/11/2020 15:11   DG  CHEST PORT 1 VIEW  Result Date: 03/09/2020 CLINICAL DATA:  Vomiting blood with dizziness and weakness. EXAM: PORTABLE CHEST 1 VIEW COMPARISON:  December 22, 2018 FINDINGS: A right internal jugular venous catheter is seen. Its distal tip extends laterally across the right apex and sits along the right axilla. There is no evidence of acute infiltrate, pleural effusion or pneumothorax. The heart size and mediastinal contours are within normal limits. The visualized skeletal structures are unremarkable. IMPRESSION: 1. Malpositioned right internal jugular venous catheter, without evidence of acute cardiopulmonary disease. Electronically Signed  By: Aram Candela M.D.   On: 03/09/2020 23:16   DG Abd Portable 1V  Result Date: 03/14/2020 CLINICAL DATA:  Lower abdominal pain and distention. EXAM: PORTABLE ABDOMEN - 1 VIEW COMPARISON:  August 23, 2015. FINDINGS: The bowel gas pattern is normal. No radio-opaque calculi or other significant radiographic abnormality are seen. IMPRESSION: Negative. Electronically Signed   By: Lupita Raider M.D.   On: 03/14/2020 13:21   ECHOCARDIOGRAM COMPLETE  Result Date: 03/10/2020    ECHOCARDIOGRAM REPORT   Patient Name:   Michelle Schneider Date of Exam: 03/10/2020 Medical Rec #:  [id number]         Height:       62.0 in Accession #:    [id number]        Weight:       92.0 lb Date of Birth:  1960-08-02          BSA:          1.374 m Patient Age:    59 years          BP:           117/61 mmHg Patient Gender: F                 HR:           80 bpm. Exam Location:  Inpatient Procedure: 2D Echo, Cardiac Doppler and Color Doppler Indications:    Murmur 785.2 / R01.1  History:        Patient has no prior history of Echocardiogram examinations.                 Risk Factors:Dyslipidemia. Raynaud's disease (From Hx).  Sonographer:    Celesta Gentile RCS Referring Phys: [id number] MANUEL E IZQUIERDO IMPRESSIONS  1. Left ventricular ejection fraction, by estimation, is 70 to 75%. The left  ventricle has hyperdynamic function. The left ventricle has no regional wall motion abnormalities. Left ventricular diastolic parameters are consistent with Grade I diastolic dysfunction (impaired relaxation).  2. Right ventricular systolic function is hyperdynamic. The right ventricular size is normal. There is normal pulmonary artery systolic pressure.  3. Left atrial size was mildly dilated.  4. Right atrial size was mildly dilated.  5. The mitral valve is normal in structure. No evidence of mitral valve regurgitation. No evidence of mitral stenosis.  6. The aortic valve is tricuspid. There is mild calcification of the aortic valve. Aortic valve regurgitation is not visualized. Mild to moderate aortic valve sclerosis/calcification is present, without any evidence of aortic stenosis.  7. The inferior vena cava is normal in size with greater than 50% respiratory variability, suggesting right atrial pressure of 3 mmHg. FINDINGS  Left Ventricle: Left ventricular ejection fraction, by estimation, is 70 to 75%. The left ventricle has hyperdynamic function. The left ventricle has no regional wall motion abnormalities. The left ventricular internal cavity size was normal in size. There is no left ventricular hypertrophy. Left ventricular diastolic parameters are consistent with Grade I diastolic dysfunction (impaired relaxation). Right Ventricle: The right ventricular size is normal. No increase in right ventricular wall thickness. Right ventricular systolic function is hyperdynamic. There is normal pulmonary artery systolic pressure. The tricuspid regurgitant velocity is 2.53 m/s, and with an assumed right atrial pressure of 3 mmHg, the estimated right ventricular systolic pressure is 28.6 mmHg. Left Atrium: Left atrial size was mildly dilated. Right Atrium: Right atrial size was mildly dilated. Pericardium: There is no evidence of pericardial effusion. Mitral Valve: The mitral  valve is normal in structure. No evidence  of mitral valve regurgitation. No evidence of mitral valve stenosis. Tricuspid Valve: The tricuspid valve is normal in structure. Tricuspid valve regurgitation is mild . No evidence of tricuspid stenosis. Aortic Valve: The aortic valve is tricuspid. There is mild calcification of the aortic valve. Aortic valve regurgitation is not visualized. Mild to moderate aortic valve sclerosis/calcification is present, without any evidence of aortic stenosis. Pulmonic Valve: The pulmonic valve was grossly normal. Pulmonic valve regurgitation is trivial. No evidence of pulmonic stenosis. Aorta: The aortic root is normal in size and structure. Venous: The inferior vena cava is normal in size with greater than 50% respiratory variability, suggesting right atrial pressure of 3 mmHg. IAS/Shunts: The interatrial septum appears to be lipomatous. No atrial level shunt detected by color flow Doppler.  LEFT VENTRICLE PLAX 2D LVIDd:         3.70 cm  Diastology LVIDs:         1.60 cm  LV e' medial:    6.09 cm/s LV PW:         0.70 cm  LV E/e' medial:  13.4 LV IVS:        1.00 cm  LV e' lateral:   5.98 cm/s LVOT diam:     1.80 cm  LV E/e' lateral: 13.6 LV SV:         76 LV SV Index:   56 LVOT Area:     2.54 cm  RIGHT VENTRICLE RV S prime:     17.90 cm/s TAPSE (M-mode): 2.4 cm LEFT ATRIUM             Index       RIGHT ATRIUM           Index LA diam:        2.90 cm 2.11 cm/m  RA Area:     10.80 cm LA Vol (A2C):   32.0 ml 23.28 ml/m RA Volume:   24.80 ml  18.05 ml/m LA Vol (A4C):   34.2 ml 24.85 ml/m LA Biplane Vol: 34.4 ml 25.03 ml/m  AORTIC VALVE LVOT Vmax:   157.50 cm/s LVOT Vmean:  108.000 cm/s LVOT VTI:    0.300 m  AORTA Ao Root diam: 3.00 cm MITRAL VALVE                TRICUSPID VALVE MV Area (PHT): 2.56 cm     TR Peak grad:   25.6 mmHg MV Decel Time: 296 msec     TR Vmax:        253.00 cm/s MV E velocity: 81.50 cm/s MV A velocity: 117.00 cm/s  SHUNTS MV E/A ratio:  0.70         Systemic VTI:  0.30 m                              Systemic Diam: 1.80 cm Arvilla Meres MD Electronically signed by Arvilla Meres MD Signature Date/Time: 03/10/2020/8:22:55 PM    Final     Microbiology: Recent Results (from the past 240 hour(s))  Resp Panel by RT-PCR (Flu A&B, Covid) Nasopharyngeal Swab     Status: None   Collection Time: 03/09/20  5:48 PM   Specimen: Nasopharyngeal Swab; Nasopharyngeal(NP) swabs in vial transport medium  Result Value Ref Range Status   SARS Coronavirus 2 by RT PCR NEGATIVE NEGATIVE Final    Comment: (NOTE) SARS-CoV-2 target nucleic acids are NOT DETECTED.  The  SARS-CoV-2 RNA is generally detectable in upper respiratory specimens during the acute phase of infection. The lowest concentration of SARS-CoV-2 viral copies this assay can detect is 138 copies/mL. A negative result does not preclude SARS-Cov-2 infection and should not be used as the sole basis for treatment or other patient management decisions. A negative result may occur with  improper specimen collection/handling, submission of specimen other than nasopharyngeal swab, presence of viral mutation(s) within the areas targeted by this assay, and inadequate number of viral copies(<138 copies/mL). A negative result must be combined with clinical observations, patient history, and epidemiological information. The expected result is Negative.  Fact Sheet for Patients:  BloggerCourse.com  Fact Sheet for Healthcare Providers:  SeriousBroker.it  This test is no t yet approved or cleared by the [place] FDA and  has been authorized for detection and/or diagnosis of SARS-CoV-2 by FDA under an Emergency Use Authorization (EUA). This EUA will remain  in effect (meaning this test can be used) for the duration of the COVID-19 declaration under Section 564(b)(1) of the Act, 21 U.S.C.section 360bbb-3(b)(1), unless the authorization is terminated  or revoked sooner.       Influenza A by PCR  NEGATIVE NEGATIVE Final   Influenza B by PCR NEGATIVE NEGATIVE Final    Comment: (NOTE) The Xpert Xpress SARS-CoV-2/FLU/RSV plus assay is intended as an aid in the diagnosis of influenza from Nasopharyngeal swab specimens and should not be used as a sole basis for treatment. Nasal washings and aspirates are unacceptable for Xpert Xpress SARS-CoV-2/FLU/RSV testing.  Fact Sheet for Patients: BloggerCourse.com  Fact Sheet for Healthcare Providers: SeriousBroker.it  This test is not yet approved or cleared by the [place] FDA and has been authorized for detection and/or diagnosis of SARS-CoV-2 by FDA under an Emergency Use Authorization (EUA). This EUA will remain in effect (meaning this test can be used) for the duration of the COVID-19 declaration under Section 564(b)(1) of the Act, 21 U.S.C. section 360bbb-3(b)(1), unless the authorization is terminated or revoked.  Performed at [facility], [address], [place], [place] [address]   MRSA PCR Screening     Status: None   Collection Time: 03/10/20 12:16 AM   Specimen: Nasal Mucosa; Nasopharyngeal  Result Value Ref Range Status   MRSA by PCR NEGATIVE NEGATIVE Final    Comment:        The GeneXpert MRSA Assay (FDA approved for NASAL specimens only), is one component of a comprehensive MRSA colonization surveillance program. It is not intended to diagnose MRSA infection nor to guide or monitor treatment for MRSA infections. Performed at [facility], [address], [place], [place] [address]   Culture, blood (routine x 2)     Status: None (Preliminary result)   Collection Time: 03/10/20  5:43 AM   Specimen: BLOOD LEFT HAND  Result Value Ref Range Status   Specimen Description BLOOD LEFT HAND  Final   Special Requests   Final    BOTTLES DRAWN AEROBIC AND ANAEROBIC Blood Culture adequate volume   Culture   Final    NO GROWTH 4 DAYS Performed at [facility], [address], [place], [place] [address]    Report Status PENDING  Incomplete  Culture, blood (routine x 2)     Status: None (Preliminary result)   Collection Time: 03/10/20  5:43 AM   Specimen: BLOOD RIGHT HAND  Result Value Ref Range Status   Specimen Description BLOOD RIGHT HAND  Final  Special Requests   Final    BOTTLES DRAWN AEROBIC ONLY Blood Culture results may not be optimal due to an inadequate volume of blood received in culture bottles   Culture   Final    NO GROWTH 4 DAYS Performed at [facility], [address], [place], [place] [address]    Report Status PENDING  Incomplete     Labs: Basic Metabolic Panel: Recent Labs  Lab 03/09/20 1748 03/09/20 1802 03/10/20 0154 03/10/20 0521 03/11/20 0700 03/12/20 0512 03/13/20 0136  NA 135   < > 139 138 138 134* 133*  K 3.0*   < > 3.5 3.8 3.3* 3.5 3.6  CL 95*   < > 103 103 105 104 102  CO2 20*  --  18* 23 23 23 23   GLUCOSE 108*   < > 115* 127* 118* 98 90  BUN 19   < > 20 19 13 8  5*  CREATININE 0.77   < > 0.75 0.69 0.59 0.52 0.50  CALCIUM 8.9  --  8.1* 8.1* 8.3* 8.1* 8.2*  MG 1.7  --   --  1.5* 1.8 1.8  --   PHOS  --   --   --  3.0 1.7* 2.5  --    < > = values in this interval not displayed.   Liver Function Tests: Recent Labs  Lab 03/09/20 1748 03/10/20 0154 03/10/20 0521 03/11/20 0700 03/13/20 0136  AST 148* 145* 155* 164* 152*  ALT 49* 46* 46* 48* 53*  ALKPHOS 52 39 41 41 51  BILITOT 7.8* 9.9* 9.9* 7.6* 9.5*  PROT 6.6 5.4* 5.0* 5.1* 5.3*  ALBUMIN 3.1* 2.6* 2.5* 2.4* 2.3*   Recent Labs  Lab 03/09/20 1748 03/09/20 2256  LIPASE 96* 65*   Recent Labs  Lab 03/10/20 0202  AMMONIA 65*   CBC: Recent Labs  Lab 03/09/20 1748 03/09/20 1802 03/10/20 2318 03/11/20 0900 03/11/20 1830 03/12/20 0512 03/13/20 0136  WBC 4.4   < > 5.6 4.3 4.6 4.3 3.6*  NEUTROABS 3.1  --   --   --   --   --   --   HGB 8.1*   < > 7.8* 7.9* 8.6* 8.1* 8.5*  HCT 24.5*   < > 22.6* 22.3* 25.3* 24.2*  24.3*  MCV 104.7*   < > 92.2 92.1 93.4 94.9 94.6  PLT 19*   < > 41* PLATELET CLUMPS NOTED ON SMEAR, COUNT APPEARS DECREASED 36* 42* 39*   < > = values in this interval not displayed.   Cardiac Enzymes: No results for input(s): CKTOTAL, CKMB, CKMBINDEX, TROPONINI in the last 168 hours. BNP: BNP (last 3 results) No results for input(s): BNP in the last 8760 hours.  ProBNP (last 3 results) No results for input(s): PROBNP in the last 8760 hours.  CBG: Recent Labs  Lab 03/09/20 2344  GLUCAP 73       Signed:  03/15/20, MD [facility] 03/14/2020, 3:28 PM

## 2020-03-15 LAB — CULTURE, BLOOD (ROUTINE X 2)
Culture: NO GROWTH
Culture: NO GROWTH
Special Requests: ADEQUATE

## 2020-03-15 NOTE — TOC Transition Note (Signed)
Transition of Care (TOC) - CM/SW Discharge Note Donn Pierini RN, BSN Transitions of Care Unit 4E- RN Case Manager See Treatment Team for direct phone #    Patient Details  Name: Michelle Schneider MRN: [id number] Date of Birth: [birth year]  Transition of Care [facility]) CM/SW Contact:  Darrold Span, RN Phone Number: 03/15/2020, 2:19 PM   Clinical Narrative:    Noted pt discharged after TOC hours yesterday with [facility] and DME orders, have tried to call patient at home post discharge to f/u on transition needs- have tried multiple times with no answer and no way to left msg for pt to return call- unable to f/u on [facility] and DME that was not set up on discharge.      Barriers to Discharge: Barriers Resolved   Patient Goals and CMS Choice Patient states their goals for this hospitalization and ongoing recovery are:: return home CMS Medicare.gov Compare Post Acute Care list provided to:: Patient Choice offered to / list presented to : NA  Discharge Placement               home        Discharge Plan and Services In-house Referral: Clinical Social Work Discharge Planning Services: CM Consult            DME Arranged: 3-N-1,Walker rolling         HH Arranged: PT          Social Determinants of Health (SDOH) Interventions     Readmission Risk Interventions Readmission Risk Prevention Plan 12/28/2018  Post Dischage Appt Complete  Medication Screening Complete  Transportation Screening Complete  Some recent data might be hidden

## 2020-03-17 ENCOUNTER — Other Ambulatory Visit: Payer: Self-pay

## 2020-03-17 ENCOUNTER — Emergency Department (HOSPITAL_COMMUNITY): Payer: No Typology Code available for payment source

## 2020-03-17 ENCOUNTER — Inpatient Hospital Stay (HOSPITAL_COMMUNITY): Payer: No Typology Code available for payment source

## 2020-03-17 ENCOUNTER — Encounter (HOSPITAL_COMMUNITY): Payer: Self-pay | Admitting: Emergency Medicine

## 2020-03-17 ENCOUNTER — Inpatient Hospital Stay (HOSPITAL_COMMUNITY)
Admission: EM | Admit: 2020-03-17 | Discharge: 2020-04-16 | DRG: 871 | Disposition: E | Payer: No Typology Code available for payment source | Attending: Student | Admitting: Student

## 2020-03-17 ENCOUNTER — Telehealth: Payer: Self-pay | Admitting: Gastroenterology

## 2020-03-17 DIAGNOSIS — D62 Acute posthemorrhagic anemia: Secondary | ICD-10-CM | POA: Diagnosis present

## 2020-03-17 DIAGNOSIS — E785 Hyperlipidemia, unspecified: Secondary | ICD-10-CM | POA: Diagnosis present

## 2020-03-17 DIAGNOSIS — Z90710 Acquired absence of both cervix and uterus: Secondary | ICD-10-CM | POA: Diagnosis not present

## 2020-03-17 DIAGNOSIS — J189 Pneumonia, unspecified organism: Secondary | ICD-10-CM | POA: Diagnosis present

## 2020-03-17 DIAGNOSIS — R578 Other shock: Secondary | ICD-10-CM | POA: Diagnosis present

## 2020-03-17 DIAGNOSIS — I7300 Raynaud's syndrome without gangrene: Secondary | ICD-10-CM | POA: Diagnosis present

## 2020-03-17 DIAGNOSIS — A419 Sepsis, unspecified organism: Secondary | ICD-10-CM | POA: Diagnosis present

## 2020-03-17 DIAGNOSIS — I8510 Secondary esophageal varices without bleeding: Secondary | ICD-10-CM | POA: Diagnosis present

## 2020-03-17 DIAGNOSIS — K754 Autoimmune hepatitis: Secondary | ICD-10-CM | POA: Diagnosis present

## 2020-03-17 DIAGNOSIS — I469 Cardiac arrest, cause unspecified: Secondary | ICD-10-CM | POA: Diagnosis not present

## 2020-03-17 DIAGNOSIS — K920 Hematemesis: Secondary | ICD-10-CM

## 2020-03-17 DIAGNOSIS — E872 Acidosis: Secondary | ICD-10-CM | POA: Diagnosis present

## 2020-03-17 DIAGNOSIS — R579 Shock, unspecified: Secondary | ICD-10-CM

## 2020-03-17 DIAGNOSIS — Z20822 Contact with and (suspected) exposure to covid-19: Secondary | ICD-10-CM | POA: Diagnosis present

## 2020-03-17 DIAGNOSIS — K922 Gastrointestinal hemorrhage, unspecified: Secondary | ICD-10-CM

## 2020-03-17 DIAGNOSIS — K7030 Alcoholic cirrhosis of liver without ascites: Secondary | ICD-10-CM | POA: Diagnosis present

## 2020-03-17 DIAGNOSIS — N179 Acute kidney failure, unspecified: Secondary | ICD-10-CM | POA: Diagnosis present

## 2020-03-17 DIAGNOSIS — J9601 Acute respiratory failure with hypoxia: Secondary | ICD-10-CM | POA: Diagnosis present

## 2020-03-17 DIAGNOSIS — Z885 Allergy status to narcotic agent status: Secondary | ICD-10-CM

## 2020-03-17 DIAGNOSIS — R791 Abnormal coagulation profile: Secondary | ICD-10-CM | POA: Diagnosis present

## 2020-03-17 DIAGNOSIS — Z9289 Personal history of other medical treatment: Secondary | ICD-10-CM

## 2020-03-17 DIAGNOSIS — Z79899 Other long term (current) drug therapy: Secondary | ICD-10-CM

## 2020-03-17 DIAGNOSIS — Z4659 Encounter for fitting and adjustment of other gastrointestinal appliance and device: Secondary | ICD-10-CM

## 2020-03-17 LAB — I-STAT ARTERIAL BLOOD GAS, ED
Acid-base deficit: 17 mmol/L — ABNORMAL HIGH (ref 0.0–2.0)
Bicarbonate: 9.4 mmol/L — ABNORMAL LOW (ref 20.0–28.0)
Calcium, Ion: 1.1 mmol/L — ABNORMAL LOW (ref 1.15–1.40)
HCT: 19 % — ABNORMAL LOW (ref 36.0–46.0)
Hemoglobin: 6.5 g/dL — CL (ref 12.0–15.0)
O2 Saturation: 97 %
Patient temperature: 97.7
Potassium: 4.2 mmol/L (ref 3.5–5.1)
Sodium: 131 mmol/L — ABNORMAL LOW (ref 135–145)
TCO2: 10 mmol/L — ABNORMAL LOW (ref 22–32)
pCO2 arterial: 22.3 mmHg — ABNORMAL LOW (ref 32.0–48.0)
pH, Arterial: 7.228 — ABNORMAL LOW (ref 7.350–7.450)
pO2, Arterial: 102 mmHg (ref 83.0–108.0)

## 2020-03-17 LAB — RESP PANEL BY RT-PCR (FLU A&B, COVID) ARPGX2
Influenza A by PCR: NEGATIVE
Influenza B by PCR: NEGATIVE
SARS Coronavirus 2 by RT PCR: NEGATIVE

## 2020-03-17 LAB — COMPREHENSIVE METABOLIC PANEL
ALT: 51 U/L — ABNORMAL HIGH (ref 0–44)
AST: 128 U/L — ABNORMAL HIGH (ref 15–41)
Albumin: 2.3 g/dL — ABNORMAL LOW (ref 3.5–5.0)
Alkaline Phosphatase: 92 U/L (ref 38–126)
Anion gap: 26 — ABNORMAL HIGH (ref 5–15)
BUN: 13 mg/dL (ref 8–23)
CO2: 9 mmol/L — ABNORMAL LOW (ref 22–32)
Calcium: 9.5 mg/dL (ref 8.9–10.3)
Chloride: 96 mmol/L — ABNORMAL LOW (ref 98–111)
Creatinine, Ser: 1.04 mg/dL — ABNORMAL HIGH (ref 0.44–1.00)
GFR, Estimated: >60 mL/min (ref >60)
Glucose, Bld: 63 mg/dL — ABNORMAL LOW (ref 70–99)
Potassium: 4.3 mmol/L (ref 3.5–5.1)
Sodium: 131 mmol/L — ABNORMAL LOW (ref 135–145)
Total Bilirubin: 10.3 mg/dL — ABNORMAL HIGH (ref 0.3–1.2)
Total Protein: 5.9 g/dL — ABNORMAL LOW (ref 6.5–8.1)

## 2020-03-17 LAB — CBC
HCT: 30.1 % — ABNORMAL LOW (ref 36.0–46.0)
Hemoglobin: 9.1 g/dL — ABNORMAL LOW (ref 12.0–15.0)
MCH: 33.6 pg (ref 26.0–34.0)
MCHC: 30.2 g/dL (ref 30.0–36.0)
MCV: 111.1 fL — ABNORMAL HIGH (ref 80.0–100.0)
Platelets: 146 10*3/uL — ABNORMAL LOW (ref 150–400)
RBC: 2.71 MIL/uL — ABNORMAL LOW (ref 3.87–5.11)
RDW: 25.1 % — ABNORMAL HIGH (ref 11.5–15.5)
WBC: 16.2 10*3/uL — ABNORMAL HIGH (ref 4.0–10.5)
nRBC: 0.6 % — ABNORMAL HIGH (ref 0.0–0.2)

## 2020-03-17 LAB — PROTIME-INR
INR: 3 — ABNORMAL HIGH (ref 0.8–1.2)
Prothrombin Time: 30.4 seconds — ABNORMAL HIGH (ref 11.4–15.2)

## 2020-03-17 LAB — PREPARE RBC (CROSSMATCH)

## 2020-03-17 LAB — I-STAT CHEM 8, ED
BUN: 16 mg/dL (ref 8–23)
Calcium, Ion: 1.09 mmol/L — ABNORMAL LOW (ref 1.15–1.40)
Chloride: 97 mmol/L — ABNORMAL LOW (ref 98–111)
Creatinine, Ser: 0.7 mg/dL (ref 0.44–1.00)
Glucose, Bld: 54 mg/dL — ABNORMAL LOW (ref 70–99)
HCT: 33 % — ABNORMAL LOW (ref 36.0–46.0)
Hemoglobin: 11.2 g/dL — ABNORMAL LOW (ref 12.0–15.0)
Potassium: 4.1 mmol/L (ref 3.5–5.1)
Sodium: 130 mmol/L — ABNORMAL LOW (ref 135–145)
TCO2: 13 mmol/L — ABNORMAL LOW (ref 22–32)

## 2020-03-17 LAB — LACTIC ACID, PLASMA: Lactic Acid, Venous: >11 mmol/L (ref 0.5–1.9)

## 2020-03-17 MED ORDER — OCTREOTIDE LOAD VIA INFUSION
50.0000 ug | Freq: Once | INTRAVENOUS | Status: AC
  Administered 2020-03-17: 50 ug via INTRAVENOUS
  Filled 2020-03-17: qty 25

## 2020-03-17 MED ORDER — PHENYLEPHRINE 40 MCG/ML (10ML) SYRINGE FOR IV PUSH (FOR BLOOD PRESSURE SUPPORT)
PREFILLED_SYRINGE | INTRAVENOUS | Status: AC
  Filled 2020-03-17: qty 10

## 2020-03-17 MED ORDER — DOCUSATE SODIUM 100 MG PO CAPS
100.0000 mg | ORAL_CAPSULE | Freq: Two times a day (BID) | ORAL | Status: DC | PRN
Start: 2020-03-17 — End: 2020-03-18

## 2020-03-17 MED ORDER — SODIUM BICARBONATE 8.4 % IV SOLN
INTRAVENOUS | Status: AC
  Administered 2020-03-18: 50 meq
  Filled 2020-03-17: qty 50

## 2020-03-17 MED ORDER — SODIUM CHLORIDE 0.9 % IV SOLN
1.0000 g | Freq: Once | INTRAVENOUS | Status: DC
  Administered 2020-03-17: 1 g via INTRAVENOUS
  Filled 2020-03-17: qty 10

## 2020-03-17 MED ORDER — PANTOPRAZOLE SODIUM 40 MG IV SOLR
40.0000 mg | Freq: Once | INTRAVENOUS | Status: AC
  Administered 2020-03-17: 40 mg via INTRAVENOUS
  Filled 2020-03-17: qty 40

## 2020-03-17 MED ORDER — SODIUM CHLORIDE 0.9 % IV SOLN: 10.0000 mL/h | Freq: Once | INTRAVENOUS | Status: DC

## 2020-03-17 MED ORDER — ROCURONIUM BROMIDE 10 MG/ML (PF) SYRINGE
PREFILLED_SYRINGE | INTRAVENOUS | Status: AC
  Filled 2020-03-17: qty 10

## 2020-03-17 MED ORDER — SODIUM CHLORIDE 0.9 % IV BOLUS
1000.0000 mL | Freq: Once | INTRAVENOUS | Status: AC
  Administered 2020-03-17: 1000 mL via INTRAVENOUS

## 2020-03-17 MED ORDER — ETOMIDATE 2 MG/ML IV SOLN
INTRAVENOUS | Status: AC
  Filled 2020-03-17: qty 20

## 2020-03-17 MED ORDER — VANCOMYCIN HCL IN DEXTROSE 1-5 GM/200ML-% IV SOLN
1000.0000 mg | INTRAVENOUS | Status: DC
  Filled 2020-03-17: qty 200

## 2020-03-17 MED ORDER — SODIUM CHLORIDE 0.9 % IV SOLN: 2.0000 g | Freq: Two times a day (BID) | INTRAVENOUS | Status: DC

## 2020-03-17 MED ORDER — CALCIUM GLUCONATE 10 % IV SOLN
INTRAVENOUS | Status: AC
  Filled 2020-03-17: qty 10

## 2020-03-17 MED ORDER — SODIUM CHLORIDE 0.9 % IV SOLN: INTRAVENOUS | Status: DC

## 2020-03-17 MED ORDER — FENTANYL CITRATE (PF) 100 MCG/2ML IJ SOLN: 25.0000 ug | Freq: Once | INTRAMUSCULAR | Status: DC

## 2020-03-17 MED ORDER — SODIUM CHLORIDE 0.9% IV SOLUTION: Freq: Once | INTRAVENOUS | Status: DC

## 2020-03-17 MED ORDER — SODIUM BICARBONATE 8.4 % IV SOLN
INTRAVENOUS | Status: DC
  Filled 2020-03-17 (×2): qty 850

## 2020-03-17 MED ORDER — NOREPINEPHRINE 4 MG/250ML-% IV SOLN
INTRAVENOUS | Status: AC
  Administered 2020-03-17: 50 ug/kg/min
  Filled 2020-03-17: qty 250

## 2020-03-17 MED ORDER — SODIUM CHLORIDE 0.9 % IV SOLN
8.0000 mg/h | INTRAVENOUS | Status: DC
  Filled 2020-03-17: qty 80

## 2020-03-17 MED ORDER — POLYETHYLENE GLYCOL 3350 17 G PO PACK: 17.0000 g | PACK | Freq: Every day | ORAL | Status: DC | PRN

## 2020-03-17 MED ORDER — VITAMIN K1 10 MG/ML IJ SOLN
10.0000 mg | Freq: Once | INTRAVENOUS | Status: DC
  Filled 2020-03-17: qty 1

## 2020-03-17 MED ORDER — SODIUM CHLORIDE 0.9 % IV SOLN
50.0000 ug/h | INTRAVENOUS | Status: DC
  Administered 2020-03-17: 50 ug/h via INTRAVENOUS
  Filled 2020-03-17: qty 1

## 2020-03-17 MED ORDER — SUCCINYLCHOLINE CHLORIDE 200 MG/10ML IV SOSY
PREFILLED_SYRINGE | INTRAVENOUS | Status: AC
  Filled 2020-03-17: qty 10

## 2020-03-17 NOTE — Progress Notes (Signed)
Pharmacy Antibiotic Note  Michelle Schneider is a 60 y.o. female admitted on 04/13/2020 with pneumonia.  Pharmacy has been consulted for Cefepime and Vancomycin dosing.      Temp (24hrs), Avg:97.7 F (36.5 C), Min:97.7 F (36.5 C), Max:97.7 F (36.5 C)  Recent Labs  Lab 03/11/20 0700 03/11/20 0900 03/11/20 1830 03/12/20 0512 03/13/20 0136 03/26/2020 1941 03/23/2020 1947 03/25/2020 2005  WBC  --  4.3 4.6 4.3 3.6*  --  16.2*  --   CREATININE 0.59  --   --  0.52 0.50  --  1.04* 0.70  LATICACIDVEN 1.6  --   --   --   --  >11.0*  --   --     Estimated Creatinine Clearance: 56.4 mL/min (by C-G formula based on SCr of 0.7 mg/dL).    Allergies  Allergen Reactions  . Oxycodone-Acetaminophen Nausea And Vomiting and Other (See Comments)    Hallucinations and bad dreams    Antimicrobials this admission: 1/2 Cefepime >>  1/2 Vancomycin >>   Dose adjustments this admission: N/a  Microbiology results: Pending   Plan:  - Cefepime 2g IV q12h - Start Vancomycin 1000mg  IV q24h - Est Calc AUC 542 - Monitor patients renal function and urine output  - De-escalate ABX when appropriate   Thank you for allowing pharmacy to be a part of this patient's care.  PharmD. BCPS 04/12/2020 9:24 PM

## 2020-03-17 NOTE — ED Provider Notes (Signed)
[facility] EMERGENCY DEPARTMENT Provider Note   CSN: [id number] Arrival date & time: 04/02/2020  1848     History Chief Complaint  Patient presents with  . Hematemesis    Michelle Schneider is a 60 y.o. female.  Presenting to ER with concern for bright red blood in vomit.  Had a couple episodes this afternoon mix of dark blood and bright red blood.  Since feels mildly nauseous but no vomit since in ER.  No associated abdominal pain.  Has been feeling more short of breath.  No chest pain or abdominal pain or back pain.  No known fever.  HPI     Past Medical History:  Diagnosis Date  . Autoimmune hepatitis (HCC)   . Cirrhosis (HCC)   . Esophageal varices (HCC)   . Family history of adverse reaction to anesthesia    cousin had a difficult time waking up after appendectomy  . Heart murmur    "diagnosed with a very slight heart murmur in 2001 but no other doctor has been able to hear it"  . Hyperlipidemia   . Raynaud's disease     Patient Active Problem List   Diagnosis Date Noted  . Malnutrition of moderate degree 03/14/2020  . Severe protein-calorie malnutrition (HCC) 03/12/2020  . Constipation 03/12/2020  . History of esophageal varices   . Thrombocytopenia (HCC) 03/09/2020  . Hematemesis 03/09/2020  . Hemorrhagic shock (HCC) 03/09/2020  . Acute upper GI bleed 03/09/2020  . Subdural hematoma (HCC) 12/22/2018  . SDH (subdural hematoma) (HCC) 12/22/2018  . Subdural bleeding (HCC) 12/22/2018  . Autoimmune hepatitis (HCC) 11/07/2015  . Hypoxia 08/24/2015  . Pleural effusion 08/24/2015  . Elevated bilirubin 08/24/2015  . Liver cirrhosis (HCC) 08/24/2015  . Elevated INR 08/24/2015  . Anemia 08/24/2015  . Esophageal varices in cirrhosis (HCC)   . Ascites 08/23/2015    Past Surgical History:  Procedure Laterality Date  . ABDOMINAL HYSTERECTOMY    . CRANIOTOMY Left 12/22/2018   Procedure: CRANIOTOMY HEMATOMA EVACUATION SUBDURAL;  Surgeon: Maeola Harman,  MD;  Location: [facility] OR;  Service: Neurosurgery;  Laterality: Left;  . ESOPHAGEAL BANDING  03/10/2020   Procedure: ESOPHAGEAL BANDING;  Surgeon: Benancio Deeds, MD;  Location: [facility] ENDOSCOPY;  Service: Gastroenterology;;  . ESOPHAGOGASTRODUODENOSCOPY (EGD) WITH PROPOFOL N/A 06/15/2017   Procedure: ESOPHAGOGASTRODUODENOSCOPY (EGD) WITH PROPOFOL;  Surgeon: Benancio Deeds, MD;  Location: [facility] ENDOSCOPY;  Service: Gastroenterology;  Laterality: N/A;  . ESOPHAGOGASTRODUODENOSCOPY (EGD) WITH PROPOFOL N/A 03/10/2020   Procedure: ESOPHAGOGASTRODUODENOSCOPY (EGD) WITH PROPOFOL;  Surgeon: Benancio Deeds, MD;  Location: [facility] ENDOSCOPY;  Service: Gastroenterology;  Laterality: N/A;  . HEMOSTASIS CLIP PLACEMENT  03/10/2020   Procedure: HEMOSTASIS CLIP PLACEMENT;  Surgeon: Benancio Deeds, MD;  Location: [facility] ENDOSCOPY;  Service: Gastroenterology;;  . ORIF HUMERUS FRACTURE Right 05/23/2015   Procedure: OPEN REDUCTION INTERNAL FIXATION (ORIF) RIGHT  PROXIMAL HUMERUS FRACTURE;  Surgeon: Francena Hanly, MD;  Location: [facility] OR;  Service: Orthopedics;  Laterality: Right;  . WISDOM TOOTH EXTRACTION       OB History   No obstetric history on file.     Family History  Problem Relation Age of Onset  . Dementia Father   . Parkinson's disease Mother   . Stroke Brother   . Colon cancer Neg Hx   . Esophageal cancer Neg Hx   . Pancreatic cancer Neg Hx   . Rectal cancer Neg Hx   . Stomach cancer Neg Hx     Social History  Tobacco Use  . Smoking status: Never Smoker  . Smokeless tobacco: Never Used  Vaping Use  . Vaping Use: Never used  Substance Use Topics  . Alcohol use: Yes    Alcohol/week: 0.0 standard drinks    Comment: occasional  . Drug use: No    Home Medications Prior to Admission medications   Medication Sig Start Date End Date Taking? Authorizing Provider  docusate sodium (COLACE) 100 MG capsule Take 1 capsule (100 mg total) by mouth 2 (two) times daily. 03/14/20   Annita Brod, MD  feeding supplement (ENSURE ENLIVE / ENSURE PLUS) LIQD Take 237 mLs by mouth 2 (two) times daily between meals. 03/14/20   Annita Brod, MD  folic acid (FOLVITE) 1 MG tablet Take 1 tablet (1 mg total) by mouth daily. 03/15/20   Annita Brod, MD  lactulose (CHRONULAC) 10 GM/15ML solution Take 30 mLs (20 g total) by mouth 2 (two) times daily. 03/14/20   Annita Brod, MD  Lavender Oil OIL Apply 1 application topically 2 (two) times daily.    [provider]  Lovell Sheehan OIL Take 6 drops by mouth daily.    [provider]  Multiple Vitamin (MULTIVITAMIN WITH MINERALS) TABS tablet Take 1 tablet by mouth daily. 03/15/20   Annita Brod, MD  NON FORMULARY Place 2 drops under the tongue daily. CBD Oil Doterra    [provider]  ORANGE PO Take 5 drops by mouth daily.    [provider]  OVER THE COUNTER MEDICATION Take 3 tablets by mouth 2 (two) times daily. Doterra Lifelong Vitality Pack    [provider]  OVER THE COUNTER MEDICATION Apply 1 application topically 2 (two) times daily as needed (essential oil supplement for digestive).    [provider]  rifaximin (XIFAXAN) 550 MG TABS tablet Take 1 tablet (550 mg total) by mouth 2 (two) times daily. 03/14/20   Annita Brod, MD  thiamine 100 MG tablet Take 1 tablet (100 mg total) by mouth daily. 03/15/20   Annita Brod, MD  traMADol (ULTRAM) 50 MG tablet Take 1 tablet (50 mg total) by mouth every 6 (six) hours as needed for moderate pain. 03/14/20   Annita Brod, MD    Allergies    Oxycodone-acetaminophen  Review of Systems   Review of Systems  Constitutional: Positive for chills. Negative for fever.  HENT: Negative for ear pain and sore throat.   Eyes: Negative for pain and visual disturbance.  Respiratory: Positive for cough and shortness of breath.   Cardiovascular: Negative for chest pain and palpitations.  Gastrointestinal: Positive for nausea.  Negative for abdominal pain.  Genitourinary: Negative for dysuria and hematuria.  Musculoskeletal: Negative for arthralgias and back pain.  Skin: Negative for color change and rash.  Neurological: Negative for seizures and syncope.  All other systems reviewed and are negative.   Physical Exam Updated Vital Signs BP (!) 66/53   Pulse (!) 113   Temp 97.7 F (36.5 C) (Rectal)   Resp (!) 31   SpO2 96%   Physical Exam Constitutional:      Comments: Ill-appearing, jaundiced, tachypneic  HENT:     Mouth/Throat:     Mouth: Mucous membranes are dry.  Eyes:     Extraocular Movements: Extraocular movements intact.     Pupils: Pupils are equal, round, and reactive to light.  Cardiovascular:     Rate and Rhythm: Tachycardia present.     Pulses: Normal pulses.  Pulmonary:     Comments: Tachypnea, coarse breath sounds bilaterally Abdominal:     Comments: Distended, no tenderness noted  Musculoskeletal:        General: No deformity or signs of injury.     Cervical back: Normal range of motion.  Skin:    Coloration: Skin is pale.     Comments: Jaundiced  Neurological:     General: No focal deficit present.     Mental Status: She is oriented to person, place, and time.     ED Results / Procedures / Treatments   Labs (all labs ordered are listed, but only abnormal results are displayed) Labs Reviewed  COMPREHENSIVE METABOLIC PANEL - Abnormal; Notable for the following components:      Result Value   Sodium 131 (*)    Chloride 96 (*)    CO2 9 (*)    Glucose, Bld 63 (*)    Creatinine, Ser 1.04 (*)    Total Protein 5.9 (*)    Albumin 2.3 (*)    AST 128 (*)    ALT 51 (*)    Total Bilirubin 10.3 (*)    Anion gap 26 (*)    All other components within normal limits  CBC - Abnormal; Notable for the following components:   WBC 16.2 (*)    RBC 2.71 (*)    Hemoglobin 9.1 (*)    HCT 30.1 (*)    MCV 111.1 (*)    RDW 25.1 (*)    Platelets 146 (*)    nRBC 0.6 (*)    All other  components within normal limits  LACTIC ACID, PLASMA - Abnormal; Notable for the following components:   Lactic Acid, Venous >11.0 (*)    All other components within normal limits  PROTIME-INR - Abnormal; Notable for the following components:   Prothrombin Time 30.4 (*)    INR 3.0 (*)    All other components within normal limits  I-STAT CHEM 8, ED - Abnormal; Notable for the following components:   Sodium 130 (*)    Chloride 97 (*)    Glucose, Bld 54 (*)    Calcium, Ion 1.09 (*)    TCO2 13 (*)    Hemoglobin 11.2 (*)    HCT 33.0 (*)    All other components within normal limits  I-STAT ARTERIAL BLOOD GAS, ED - Abnormal; Notable for the following components:   pH, Arterial 7.228 (*)    pCO2 arterial 22.3 (*)    Bicarbonate 9.4 (*)    TCO2 10 (*)    Acid-base deficit 17.0 (*)    Sodium 131 (*)    Calcium, Ion 1.10 (*)    HCT 19.0 (*)    Hemoglobin 6.5 (*)    All other components within normal limits  CULTURE, BLOOD (ROUTINE X 2)  CULTURE, BLOOD (ROUTINE X 2)  RESP PANEL BY RT-PCR (FLU A&B, COVID) ARPGX2  MRSA PCR SCREENING  LACTIC ACID, PLASMA  URINALYSIS, ROUTINE W REFLEX MICROSCOPIC  BLOOD GAS, VENOUS  BLOOD GAS, ARTERIAL  COMPREHENSIVE METABOLIC PANEL  LACTIC ACID, PLASMA  LACTIC ACID, PLASMA  CBC  CBC  PROTIME-INR  DIC (DISSEMINATED INTRAVASCULAR COAGULATION) PANEL (NOT AT [facility])  AMMONIA  POC OCCULT BLOOD, ED  TYPE AND SCREEN  PREPARE RBC (CROSSMATCH)    EKG EKG Interpretation  Date/Time:  Sunday Michelle 02 2022 19:52:30 EST Ventricular Rate:  123 PR Interval:    QRS Duration: 76 QT Interval:  313 QTC Calculation: 448 R Axis:   98  Text Interpretation: Sinus tachycardia Right axis deviation Borderline T wave abnormalities Confirmed by Marianna Fuss ([id number]) on 04/14/2020 7:54:07 PM   Radiology DG Chest 2 View  Result Date: 03/23/2020 CLINICAL DATA:  60 year old female with history of shortness of breath. Difficulty breathing. EXAM: CHEST - 2 VIEW  COMPARISON:  Chest x-ray 03/09/2020. FINDINGS: Poorly defined opacities and areas of interstitial prominence are noted throughout the mid to lower lungs bilaterally (left greater than right), compatible with multilobar bilateral pneumonia. Elevation of the right hemidiaphragm with small right pleural effusion. No pneumothorax. No evidence of pulmonary edema. Heart size is normal. Upper mediastinal contours are within normal limits. Status post ORIF in the right proximal humerus. Surgical clips project over the expected location of gastroesophageal junction. IMPRESSION: 1. The appearance of the lungs is most compatible with severe multilobar bilateral pneumonia. Small right pleural effusion. Electronically Signed   By: Trudie Reed M.D.   On: 04/13/2020 19:35    Procedures .Critical Care Performed by: Milagros Loll, MD Authorized by: Milagros Loll, MD   Critical care provider statement:    Critical care time (minutes):  79   Critical care was necessary to treat or prevent imminent or life-threatening deterioration of the following conditions:  Cardiac failure, circulatory failure, respiratory failure, sepsis and shock   Critical care was time spent personally by me on the following activities:  Discussions with consultants, evaluation of patient's response to treatment, examination of patient, ordering and performing treatments and interventions, ordering and review of laboratory studies, ordering and review of radiographic studies, pulse oximetry, re-evaluation of patient's condition, obtaining history from patient or surrogate and review of old charts   (including critical care time)  Medications Ordered in ED Medications  0.9 %  sodium chloride infusion (0 mL/hr Intravenous Hold 04/04/2020 2155)  octreotide (SANDOSTATIN) 2 mcg/mL load via infusion 50 mcg (50 mcg Intravenous Bolus from Bag 03/24/2020 2134)    And  octreotide (SANDOSTATIN) 500 mcg in sodium chloride 0.9 % 250 mL (2 mcg/mL)  infusion (50 mcg/hr Intravenous New Bag/Given 04/07/2020 2138)  pantoprazole (PROTONIX) 80 mg in sodium chloride 0.9 % 100 mL (0.8 mg/mL) infusion (has no administration in time range)  phytonadione (VITAMIN K) 10 mg in dextrose 5 % 50 mL IVPB (has no administration in time range)  fentaNYL (SUBLIMAZE) injection 25 mcg (has no administration in time range)  ceFEPIme (MAXIPIME) 2 g in sodium chloride 0.9 % 100 mL IVPB (has no administration in time range)  vancomycin (VANCOCIN) IVPB 1000 mg/200 mL premix (has no administration in time range)  docusate sodium (COLACE) capsule 100 mg (has no administration in time range)  polyethylene glycol (MIRALAX / GLYCOLAX) packet 17 g (has no administration in time range)  sodium bicarbonate 150 mEq in dextrose 5 % 1,000 mL infusion (has no administration in time range)  pantoprazole (PROTONIX) injection 40 mg (40 mg Intravenous Given 03/19/2020 2140)  sodium chloride 0.9 % bolus 1,000 mL (0 mLs Intravenous Stopped 04/04/2020 2135)  pantoprazole (PROTONIX) injection 40 mg (40 mg Intravenous Given 03/19/2020 2103)  sodium chloride 0.9 % bolus 1,000 mL (1,000 mLs Intravenous New Bag/Given 03/20/2020 2136)    ED Course  I have reviewed the triage vital signs and the nursing notes.  Pertinent labs & imaging results that were available during my care of the patient were reviewed by me and considered in my medical decision making (see chart for details).  Clinical Course as of 03/16/2020 2223  Sun Mar 17, 2020  1940 Discussed  with Dr. Christella Hartigan, recommends octreotide, Protonix drips, resuscitate, if she becomes unstable after initial resuscitation, would consider scoping tonight but would prefer tomorrow [RD]  2110 D/w Meier with critical care - they will admit, will give additional fluids, broaden abx [RD]    Clinical Course User Index [RD] Milagros Loll, MD   MDM Rules/Calculators/A&P                          60 year old lady recent admission for esophageal variceal  bleeding, upper GI bleed.  Autoimmune hepatitis.  Presented for vomiting bright red blood and dark blood.  On presentation here patient noted to be mildly hypoxic, tachypneic, tachycardic, hypotensive.  She appeared pale and jaundiced.  Concern initially was primarily for recurrent massive upper GI bleed.  Proceeded with broad work-up, notify Dr. Christella Hartigan with gastroenterology.  Started on Protonix drip, octreotide, antibiotic prophylaxis with Rocephin.  Order type and cross 2 units.  Her initial hemoglobin actually was okay.  Additional work-up was concerning for profound lactic acidosis, leukocytosis, multifocal pneumonia.  Consolation of findings concerning for sepsis, severe sepsis and septic shock.  She was resuscitated with fluid bolus and was initially responsive.  Antibiotics were broadened to include Vanco and cefepime.  Contacted critical care, Dr. Daphane Shepherd who came to bedside and assumed resuscitation.  Patient will need admission to ICU for continued resuscitation, careful monitoring.  He will place central line.  At present her mental status is okay and her oxygenation is okay however I have very high suspicion that patient has potential to decompensate tonight.  Patient and cousin at bedside agreeable to full code.  I did convey my concerns for her critical condition given the current clinical picture.   Final Clinical Impression(s) / ED Diagnoses Final diagnoses:  Hematemesis, presence of nausea not specified  Upper GI bleed  Pneumonia due to infectious organism, unspecified laterality, unspecified part of lung  Sepsis, due to unspecified organism, unspecified whether acute organ dysfunction present [facility])    Rx / DC Orders ED Discharge Orders    None       Milagros Loll, MD 04-13-20 2223

## 2020-03-17 NOTE — H&P (Signed)
NAME:  Michelle Schneider, MRN:  [id number], DOB:  08/26/1960, LOS: 0 ADMISSION DATE:  March 29, 2020, CONSULTATION DATE:  Mar 29, 2020 REFERRING MD:  Roslynn Amble, CHIEF COMPLAINT:  Hematemesis   Brief History   60 yo w hx of autoimmune hepatitis presents with hematemesis and melena.  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  She had a few episodes of bright red vomit today, and also a mix of dark blood, also endorsed increasing shortness of breath.  Found to be hypoxic in the ED, with R sided infiltrate on CXR.  She was also tachypneic, and ultimately needing non-rebreather, and needed intubation while in the ED. Intubation was difficult due to excessive amount of blood from GI tract, and patient was very hypotensive following, requiring high-dose pressors.  Ultimately, hemodynamics improved with blood product administration.    Patient's cousin is at bedside and assisted in goals of care with the patient, agreeing that it is within her wishes to be intubated.   Patient was recently admitted for upper GI bleed, admitted 12/25-12/30.  Had an EGD on 12/26 which showed active bleeding from probably esophageal varices and s/p 2 clips and 3 bands.    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 rate (!) 32, SpO2 99 %.       No intake or output data in the 24 hours ending Mar 29, 2020 2113 There were no vitals filed for this visit.  Examination: General: intubated, sedated HENT: NCAT, bloody secretions from mouth/nose Lungs: coarse, equal chest rise Cardiovascular: tachycardic RR, no murmur, no JVD  Abdomen: soft, distended but not taut and nontender, normal bowel sounds Extremities: mottled, edematous BLE Neuro: sedated, not following commands post intubation but rocuronium still active  Resolved Hospital Problem list   n/a  Assessment & Plan:  # Acute hypoxic respiratory failure: # Sepsis from pneumonia: Evidence of likely pneumonia at admission and then obvious aspiration at time of intubation. - mrsa swab, follow cultures and narrow ABX as able - vanc/cefepime  - full vent support  # Hematemesis: # Shock due to acute blood loss anemia Patient recent had variceal bleeding with EGD 12/26 needing hemostasis clips x2 and variceal banding x3.  Recurrent hematemesis, resulting in hemoglobin drop from 9.1 in the ED to 6.5.  Shock has improved with blood products.  INR 3.0.  67F central line placed in R femoral vein.  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 and alcohol - will need to restart lactulose/rifaximin eventually - f/u ammonia  # AKI: - f/u response to resuscitative measures abvoe  # Lactic acidosis: - trend  # Elevated INR: - s/p 10 of vitamin k, 5U cryo, 1U FFP ordered but not yet given - repeat in AM  # Transaminitis: - trend, if rising bili/ALP and sepsis fails to improve with ABX  directed at CAP with MDR risk factors then could consider RUQ [place] vs empiric c-tube (last admission concern for acute chole but chronic elevation in bili would apparently confound interpretation of HIDA    Best practice:  Diet: NPO Pain/Anxiety/Delirium protocol (if indicated): prn fentanyl for sedation VAP protocol (if indicated): yes DVT prophylaxis: holding GI prophylaxis: ppi drip Glucose control: hypoglycemia protocol, needing d10 drip Mobility: n/a Disposition:to ICU  Labs   CBC: Recent Labs  Lab 03/11/20 0900 03/11/20 1830 03/12/20 0512 03/13/20 0136 04/11/2020 1947 03/23/2020 2005  WBC 4.3 4.6 4.3 3.6* 16.2*  --   HGB 7.9* 8.6* 8.1* 8.5* 9.1* 11.2*  HCT 22.3* 25.3* 24.2* 24.3* 30.1* 33.0*  MCV 92.1 93.4 94.9 94.6 111.1*  --   PLT PLATELET CLUMPS NOTED ON SMEAR, COUNT APPEARS DECREASED 36* 42* 39* 146*  --     Basic Metabolic Panel: Recent Labs  Lab 03/11/20 0700 03/12/20 0512 03/13/20 0136 04/04/2020 1947 04/06/2020 2005  NA 138 134* 133* 131* 130*  K 3.3* 3.5 3.6 4.3 4.1  CL 105 104 102 96* 97*  CO2 23 23 23  9*  --   GLUCOSE 118* 98 90 63* 54*  BUN 13 8 5* 13 16  CREATININE 0.59 0.52 0.50 1.04* 0.70  CALCIUM 8.3* 8.1* 8.2* 9.5  --   MG 1.8 1.8  --   --   --   PHOS 1.7* 2.5  --   --   --    GFR: Estimated Creatinine Clearance: 56.4 mL/min (by C-G formula based on SCr of 0.7 mg/dL). Recent Labs  Lab 03/11/20 0700 03/11/20 0900 03/11/20 1830 03/12/20 0512 03/13/20 0136 03/25/2020 1941 04/13/2020 1947  WBC  --    < > 4.6 4.3 3.6*  --  16.2*  LATICACIDVEN 1.6  --   --   --   --  >11.0*  --    < > = values in this interval not displayed.    Liver Function Tests: Recent Labs  Lab 03/11/20 0700 03/13/20 0136 03/21/2020 1947  AST 164* 152* 128*  ALT 48* 53* 51*  ALKPHOS 41 51 92  BILITOT 7.6* 9.5* 10.3*  PROT 5.1* 5.3* 5.9*  ALBUMIN 2.4* 2.3* 2.3*   No results for input(s): LIPASE, AMYLASE in the last 168 hours. No results for input(s): AMMONIA in the  last 168 hours.  ABG    Component Value Date/Time   PHART 7.452 (H) 12/22/2018 2318   PCO2ART 35.3 12/22/2018 2318   PO2ART 184.0 (H) 12/22/2018 2318   HCO3 24.6 12/22/2018 2318   TCO2 13 (L) 03/25/2020 2005   ACIDBASEDEF 1.0 12/22/2018 1937   O2SAT 100.0 12/22/2018 2318     Coagulation Profile: Recent Labs  Lab 03/13/20 0136 03/20/2020 1941  INR 2.1* 3.0*    Cardiac Enzymes: No results for input(s): CKTOTAL, CKMB, CKMBINDEX, TROPONINI in the last 168 hours.  HbA1C: Hgb A1c MFr Bld  Date/Time Value Ref Range Status  03/10/2016 12:37 PM 5.2 4.6 - 6.5 % Final    Comment:    Glycemic Control Guidelines for People with Diabetes:Non Diabetic:  <  6%Goal of Therapy: <7%Additional Action Suggested:  >8%     CBG: No results for input(s): GLUCAP in the last 168 hours.  Review of Systems:   A twelve point review of systems is negative except as otherwise noted in HPI  Past Medical History  She,  has a past medical history of Autoimmune hepatitis (HCC), Cirrhosis (HCC), Esophageal varices (HCC), Family history of adverse reaction to anesthesia, Heart murmur, Hyperlipidemia, and Raynaud's disease.   Surgical History    Past Surgical History:  Procedure Laterality Date  . ABDOMINAL HYSTERECTOMY    . CRANIOTOMY Left 12/22/2018   Procedure: CRANIOTOMY HEMATOMA EVACUATION SUBDURAL;  Surgeon: Maeola Harman, MD;  Location: [facility] OR;  Service: Neurosurgery;  Laterality: Left;  . ESOPHAGEAL BANDING  03/10/2020   Procedure: ESOPHAGEAL BANDING;  Surgeon: Benancio Deeds, MD;  Location: [facility] ENDOSCOPY;  Service: Gastroenterology;;  . ESOPHAGOGASTRODUODENOSCOPY (EGD) WITH PROPOFOL N/A 06/15/2017   Procedure: ESOPHAGOGASTRODUODENOSCOPY (EGD) WITH PROPOFOL;  Surgeon: Benancio Deeds, MD;  Location: [facility] ENDOSCOPY;  Service: Gastroenterology;  Laterality: N/A;  . ESOPHAGOGASTRODUODENOSCOPY (EGD) WITH PROPOFOL N/A 03/10/2020   Procedure: ESOPHAGOGASTRODUODENOSCOPY (EGD) WITH PROPOFOL;  Surgeon:  Benancio Deeds, MD;  Location: [facility] ENDOSCOPY;  Service: Gastroenterology;  Laterality: N/A;  . HEMOSTASIS CLIP PLACEMENT  03/10/2020   Procedure: HEMOSTASIS CLIP PLACEMENT;  Surgeon: Benancio Deeds, MD;  Location: [facility] ENDOSCOPY;  Service: Gastroenterology;;  . ORIF HUMERUS FRACTURE Right 05/23/2015   Procedure: OPEN REDUCTION INTERNAL FIXATION (ORIF) RIGHT  PROXIMAL HUMERUS FRACTURE;  Surgeon: Francena Hanly, MD;  Location: [facility] OR;  Service: Orthopedics;  Laterality: Right;  . WISDOM TOOTH EXTRACTION       Social History   reports that she has never smoked. She has never used smokeless tobacco. She reports current alcohol use. She reports that she does not use drugs.   Family History   Her family history includes Dementia in her father; Parkinson's disease in her mother; Stroke in her brother. There is no history of Colon cancer, Esophageal cancer, Pancreatic cancer, Rectal cancer, or Stomach cancer.   Allergies Allergies  Allergen Reactions  . Oxycodone-Acetaminophen Nausea And Vomiting and Other (See Comments)    Hallucinations and bad dreams     Home Medications  Prior to Admission medications   Medication Sig Start Date End Date Taking? Authorizing Provider  docusate sodium (COLACE) 100 MG capsule Take 1 capsule (100 mg total) by mouth 2 (two) times daily. 03/14/20   Hollice Espy, MD  feeding supplement (ENSURE ENLIVE / ENSURE PLUS) LIQD Take 237 mLs by mouth 2 (two) times daily between meals. 03/14/20   Hollice Espy, MD  folic acid (FOLVITE) 1 MG tablet Take 1 tablet (1 mg total) by mouth daily. 03/15/20   Hollice Espy, MD  lactulose (CHRONULAC) 10 GM/15ML solution Take 30 mLs (20 g total) by mouth 2 (two) times daily. 03/14/20   Hollice Espy, MD  Lavender Oil OIL Apply 1 application topically 2 (two) times daily.    [provider]  Larkin Ina OIL Take 6 drops by mouth daily.    [provider]  Multiple Vitamin (MULTIVITAMIN WITH  MINERALS) TABS tablet Take 1 tablet by mouth daily. 03/15/20   Hollice Espy, MD  NON FORMULARY Place 2 drops under the tongue daily. CBD Oil Doterra    [provider]  ORANGE PO Take 5 drops by mouth daily.    [provider]  OVER THE COUNTER MEDICATION Take 3 tablets by mouth 2 (two)  times daily. Doterra Lifelong Vitality Pack    [provider]  OVER THE COUNTER MEDICATION Apply 1 application topically 2 (two) times daily as needed (essential oil supplement for digestive).    [provider]  rifaximin (XIFAXAN) 550 MG TABS tablet Take 1 tablet (550 mg total) by mouth 2 (two) times daily. 03/14/20   Hollice Espy, MD  thiamine 100 MG tablet Take 1 tablet (100 mg total) by mouth daily. 03/15/20   Hollice Espy, MD  traMADol (ULTRAM) 50 MG tablet Take 1 tablet (50 mg total) by mouth every 6 (six) hours as needed for moderate pain. 03/14/20   Hollice Espy, MD     Critical care time: 45 minutes    This patient is critically ill with multiple organ system failure; which, requires frequent high complexity decision making, assessment, support, evaluation, and titration of therapies. This was completed through the application of advanced monitoring technologies and extensive interpretation of multiple databases. During this encounter critical care time was devoted to patient care services described in this note for 45 minutes.  Vida Roller, Pulmonary/Critical Care

## 2020-03-17 NOTE — H&P (Incomplete)
NAME:  Michelle Schneider, MRN:  [id number], DOB:  1960/06/12, LOS: 0 ADMISSION DATE:  31-Mar-2020, CONSULTATION DATE:  31-Mar-2020 REFERRING MD:  ED, CHIEF COMPLAINT:  GI bleed, hypoxic respiratory failure   Brief History:  60 yo F with cirrhosis presenting with hematemesis from likely UGIB, respiratory failure, admitted from the ED to the ICU.  PCCM admitting.    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  She had a few episodes of bright red vomit today, and also a mix of dark blood, also endorsed increasing shortness of breath.  Found to be hypoxic in the ED, with R sided infiltrate on CXR.  She was also tachypneic, and ultimately needing non-rebreather, and needed intubation while in the ED. Intubation was difficult due to excessive amount of blood from GI tract, and patient was very hypotensive following, requiring high-dose pressors.  Ultimately, hemodynamics improved with blood product administration.    Patient's cousin is at bedside and assisted in goals of care with the patient, agreeing that it is within her wishes to be intubated.   Patient was recently admitted for upper GI bleed, admitted 12/25-12/30.  Had an EGD on 12/26 which showed active bleeding from probably esophageal varices and s/p 2 clips and 3 bands.    Past Medical History:  Autoimmune hepatitis Cirrhosis complicated by varices, thrombocytopenia, and coagulopathy    Significant Hospital Events:  Intubated 1/2 for hypoxic respiratory failure Central line placed R femoral 66F 1/2 Arterial line placed L femoral 1/2  Consults:  GI  Procedures:  Intubated 1/2 for hypoxic respiratory failure Central line placed R femoral 66F 1/2 Arterial line placed L femoral 1/2  Significant Diagnostic Tests:  CXR 1/2: right sided infiltrate Hg 9.1-->6.5, INR 3.0, Plts 146,  hypoglycemic-  glucose 63-->54, AST 128, ALT 51, Tbili 10/3, bicarb 9, lactic acid >11   Micro Data:  Blood cultures 1/2 SARS COV2 negative 1/2 Flu A and B negative 1/2  Antimicrobials:  Cefepime 2g 1/2 >> Vancomycin 1/2 >>  Interim History / Subjective:    Objective   Blood pressure 117/69, pulse (!) 121, temperature 97.7 F (36.5 C), temperature source Rectal, resp. rate (!) 31, SpO2 97 %.    Vent Mode: PRVC FiO2 (%):  [100 %] 100 % Set Rate:  [32 bmp] 32 bmp Vt Set:  [500 mL] 500 mL PEEP:  [10 cmH20] 10 cmH20 Plateau Pressure:  [33 cmH20] 33 cmH20  No intake or output data in the 24 hours ending 03/31/20 2340 There were no vitals filed for this visit.  Examination: General: ill looking, tachypneic, on NRB. Tired.  HENT: scleral icterus, pupils equal and reactive, oropharynx dry Lungs: crackles on right side, otherwise clear Cardiovascular: tachycardic, regular rhythm, no murmurs Abdomen: distended taut Extremities: no edema bilaterally Neuro: lethargic, but arousable.  No focal deficits GU: no abnormalities  Resolved Hospital Problem list     Assessment & Plan:   # Upper GI bleed due to varices # Shock due to acute blood loss anemia Patient recent had variceal bleeding with EGD 12/26 needing hemostasis clips x2 and variceal banding x3.  Recurrent hematemesis, resulting in   Best practice (evaluated daily)  Diet: *** Pain/Anxiety/Delirium protocol (if indicated): *** VAP protocol (if indicated): *** DVT prophylaxis: *** GI prophylaxis: *** Glucose control: *** Mobility: *** Disposition:***  Goals of Care:  Last date of multidisciplinary goals of care discussion:***  Family and staff present: *** Summary of discussion: *** Follow up goals of care discussion due: *** Code Status: ***  Labs   CBC: Recent Labs  Lab 03/11/20 0900 03/11/20 1830 03/12/20 0512 03/13/20 0136 03/16/2020 1947 04/10/2020 2005 Mar 18, 2020 2135  WBC 4.3 4.6 4.3 3.6* 16.2*  --   --   HGB 7.9*  8.6* 8.1* 8.5* 9.1* 11.2* 6.5*  HCT 22.3* 25.3* 24.2* 24.3* 30.1* 33.0* 19.0*  MCV 92.1 93.4 94.9 94.6 111.1*  --   --   PLT PLATELET CLUMPS NOTED ON SMEAR, COUNT APPEARS DECREASED 36* 42* 39* 146*  --   --     Basic Metabolic Panel: Recent Labs  Lab 03/11/20 0700 03/12/20 0512 03/13/20 0136 March 18, 2020 1947 18-Mar-2020 2005 18-Mar-2020 2135  NA 138 134* 133* 131* 130* 131*  K 3.3* 3.5 3.6 4.3 4.1 4.2  CL 105 104 102 96* 97*  --   CO2 23 23 23  9*  --   --   GLUCOSE 118* 98 90 63* 54*  --   BUN 13 8 5* 13 16  --   CREATININE 0.59 0.52 0.50 1.04* 0.70  --   CALCIUM 8.3* 8.1* 8.2* 9.5  --   --   MG 1.8 1.8  --   --   --   --   PHOS 1.7* 2.5  --   --   --   --    GFR: Estimated Creatinine Clearance: 56.4 mL/min (by C-G formula based on SCr of 0.7 mg/dL). Recent Labs  Lab 03/11/20 0700 03/11/20 0900 03/11/20 1830 03/12/20 0512 03/13/20 0136 04/02/2020 1941 04/02/2020 1947  WBC  --    < > 4.6 4.3 3.6*  --  16.2*  LATICACIDVEN 1.6  --   --   --   --  >11.0*  --    < > = values in this interval not displayed.    Liver Function Tests: Recent Labs  Lab 03/11/20 0700 03/13/20 0136 04/12/2020 1947  AST 164* 152* 128*  ALT 48* 53* 51*  ALKPHOS 41 51 92  BILITOT 7.6* 9.5* 10.3*  PROT 5.1* 5.3* 5.9*  ALBUMIN 2.4* 2.3* 2.3*   No results for input(s): LIPASE, AMYLASE in the last 168 hours. No results for input(s): AMMONIA in the last 168 hours.  ABG    Component Value Date/Time   PHART 7.228 (L) 04/11/2020 2135   PCO2ART 22.3 (L) 03/19/2020 2135   PO2ART 102 18-Mar-2020 2135   HCO3 9.4 (L) Mar 18, 2020 2135   TCO2 10 (L) 03/24/2020 2135   ACIDBASEDEF 17.0 (H) 04/02/2020 2135   O2SAT 97.0 18-Mar-2020 2135     Coagulation Profile: Recent Labs  Lab 03/13/20 0136 04/04/2020 1941  INR 2.1* 3.0*    Cardiac Enzymes: No results for input(s): CKTOTAL, CKMB, CKMBINDEX, TROPONINI in the last 168 hours.  HbA1C: Hgb A1c MFr Bld  Date/Time Value Ref Range Status  03/10/2016 12:37 PM  5.2 4.6 - 6.5 % Final    Comment:    Glycemic Control Guidelines for People with Diabetes:Non Diabetic:  <6%Goal of Therapy: <7%Additional Action Suggested:  >8%     CBG: No results for input(s): GLUCAP in the last 168 hours.  Review of Systems:   ***  Past Medical History:  She,  has a past medical history of Autoimmune hepatitis ([place]), Cirrhosis ([place]), Esophageal varices ([place]), Family history of adverse reaction to anesthesia, Heart murmur, Hyperlipidemia, and Raynaud's disease.   Surgical History:   Past Surgical History:  Procedure Laterality Date  .  ABDOMINAL HYSTERECTOMY    . CRANIOTOMY Left 12/22/2018   Procedure: CRANIOTOMY HEMATOMA EVACUATION SUBDURAL;  Surgeon: Maeola Harman, MD;  Location[facility] OR;  Service: Neurosurgery;  Laterality: Left;  . ESOPHAGEAL BANDING  03/10/2020   Procedure: ESOPHAGEAL BANDING;  Surgeon: Benancio Deeds, MD;  Location: [facility] ENDOSCOPY;  Service: Gastroenterology;;  . ESOPHAGOGASTRODUODENOSCOPY (EGD) WITH PROPOFOL N/A 06/15/2017   Procedure: ESOPHAGOGASTRODUODENOSCOPY (EGD) WITH PROPOFOL;  Surgeon: Benancio Deeds, MD;  Location: [facility] ENDOSCOPY;  Service: Gastroenterology;  Laterality: N/A;  . ESOPHAGOGASTRODUODENOSCOPY (EGD) WITH PROPOFOL N/A 03/10/2020   Procedure: ESOPHAGOGASTRODUODENOSCOPY (EGD) WITH PROPOFOL;  Surgeon: Benancio Deeds, MD;  Location: [facility] ENDOSCOPY;  Service: Gastroenterology;  Laterality: N/A;  . HEMOSTASIS CLIP PLACEMENT  03/10/2020   Procedure: HEMOSTASIS CLIP PLACEMENT;  Surgeon: Benancio Deeds, MD;  Location: [facility] ENDOSCOPY;  Service: Gastroenterology;;  . ORIF HUMERUS FRACTURE Right 05/23/2015   Procedure: OPEN REDUCTION INTERNAL FIXATION (ORIF) RIGHT  PROXIMAL HUMERUS FRACTURE;  Surgeon: Francena Hanly, MD;  Location: [facility] OR;  Service: Orthopedics;  Laterality: Right;  . WISDOM TOOTH EXTRACTION       Social History:   reports that she has never smoked. She has never used smokeless tobacco. She reports current  alcohol use. She reports that she does not use drugs.   Family History:  Her family history includes Dementia in her father; Parkinson's disease in her mother; Stroke in her brother. There is no history of Colon cancer, Esophageal cancer, Pancreatic cancer, Rectal cancer, or Stomach cancer.   Allergies Allergies  Allergen Reactions  . Oxycodone-Acetaminophen Nausea And Vomiting and Other (See Comments)    Hallucinations and bad dreams     Home Medications  Prior to Admission medications   Medication Sig Start Date End Date Taking? Authorizing Provider  docusate sodium (COLACE) 100 MG capsule Take 1 capsule (100 mg total) by mouth 2 (two) times daily. 03/14/20   Hollice Espy, MD  feeding supplement (ENSURE ENLIVE / ENSURE PLUS) LIQD Take 237 mLs by mouth 2 (two) times daily between meals. 03/14/20   Hollice Espy, MD  folic acid (FOLVITE) 1 MG tablet Take 1 tablet (1 mg total) by mouth daily. 03/15/20   Hollice Espy, MD  lactulose (CHRONULAC) 10 GM/15ML solution Take 30 mLs (20 g total) by mouth 2 (two) times daily. 03/14/20   Hollice Espy, MD  Lavender Oil OIL Apply 1 application topically 2 (two) times daily.    [provider]  Larkin Ina OIL Take 6 drops by mouth daily.    [provider]  Multiple Vitamin (MULTIVITAMIN WITH MINERALS) TABS tablet Take 1 tablet by mouth daily. 03/15/20   Hollice Espy, MD  NON FORMULARY Place 2 drops under the tongue daily. CBD Oil Doterra    [provider]  ORANGE PO Take 5 drops by mouth daily.    [provider]  OVER THE COUNTER MEDICATION Take 3 tablets by mouth 2 (two) times daily. Doterra Lifelong Vitality Pack    [provider]  OVER THE COUNTER MEDICATION Apply 1 application topically 2 (two) times daily as needed (essential oil supplement for digestive).    [provider]  rifaximin (XIFAXAN) 550 MG TABS tablet Take 1 tablet (550 mg total) by mouth 2 (two) times  daily. 03/14/20   Hollice Espy, MD  thiamine 100 MG tablet Take 1 tablet (100 mg total) by mouth daily. 03/15/20   Hollice Espy, MD  traMADol (ULTRAM) 50 MG tablet Take 1 tablet (  50 mg total) by mouth every 6 (six) hours as needed for moderate pain. 03/14/20   Hollice Espy, MD     Critical care time: ***

## 2020-03-17 NOTE — ED Triage Notes (Addendum)
Pt had EGD on 12/26 and was told to return for hematemesis.  Reports vomiting bright red blood and dark colored blood today at 3pm.  Also reports SOB.  Pt jaundice.  Pt straight to treatment room.

## 2020-03-17 NOTE — Telephone Encounter (Signed)
Michelle Schneider and her husband called tonight. She is this patients medical POA and they were at her house this afternoon. They tell me she was confused, gurgling when she was breathing and vomiting blood.  I recommended that the patient be taken to the ER for evaluation.

## 2020-03-18 DIAGNOSIS — K920 Hematemesis: Secondary | ICD-10-CM

## 2020-03-18 DIAGNOSIS — R579 Shock, unspecified: Secondary | ICD-10-CM | POA: Diagnosis not present

## 2020-03-18 DIAGNOSIS — K922 Gastrointestinal hemorrhage, unspecified: Secondary | ICD-10-CM | POA: Diagnosis not present

## 2020-03-18 LAB — CBC
HCT: 18.4 % — ABNORMAL LOW (ref 36.0–46.0)
HCT: 25 % — ABNORMAL LOW (ref 36.0–46.0)
HCT: 28.2 % — ABNORMAL LOW (ref 36.0–46.0)
Hemoglobin: 5.2 g/dL — CL (ref 12.0–15.0)
Hemoglobin: 8.1 g/dL — ABNORMAL LOW (ref 12.0–15.0)
Hemoglobin: 8.7 g/dL — ABNORMAL LOW (ref 12.0–15.0)
MCH: 29.1 pg (ref 26.0–34.0)
MCH: 31 pg (ref 26.0–34.0)
MCH: 33.3 pg (ref 26.0–34.0)
MCHC: 28.3 g/dL — ABNORMAL LOW (ref 30.0–36.0)
MCHC: 30.9 g/dL (ref 30.0–36.0)
MCHC: 32.4 g/dL (ref 30.0–36.0)
MCV: 100.4 fL — ABNORMAL HIGH (ref 80.0–100.0)
MCV: 117.9 fL — ABNORMAL HIGH (ref 80.0–100.0)
MCV: 89.9 fL (ref 80.0–100.0)
Platelets: 107 10*3/uL — ABNORMAL LOW (ref 150–400)
Platelets: 14 10*3/uL — CL (ref 150–400)
Platelets: 49 10*3/uL — ABNORMAL LOW (ref 150–400)
RBC: 1.56 MIL/uL — ABNORMAL LOW (ref 3.87–5.11)
RBC: 2.78 MIL/uL — ABNORMAL LOW (ref 3.87–5.11)
RBC: 2.81 MIL/uL — ABNORMAL LOW (ref 3.87–5.11)
RDW: 14.8 % (ref 11.5–15.5)
RDW: 20.2 % — ABNORMAL HIGH (ref 11.5–15.5)
RDW: 25.2 % — ABNORMAL HIGH (ref 11.5–15.5)
WBC: 16.5 10*3/uL — ABNORMAL HIGH (ref 4.0–10.5)
WBC: 2.7 10*3/uL — ABNORMAL LOW (ref 4.0–10.5)
WBC: 4.9 10*3/uL (ref 4.0–10.5)
nRBC: 0.7 % — ABNORMAL HIGH (ref 0.0–0.2)
nRBC: 3.1 % — ABNORMAL HIGH (ref 0.0–0.2)
nRBC: 9.6 % — ABNORMAL HIGH (ref 0.0–0.2)

## 2020-03-18 LAB — POCT I-STAT 7, (LYTES, BLD GAS, ICA,H+H)
Acid-base deficit: 20 mmol/L — ABNORMAL HIGH (ref 0.0–2.0)
Bicarbonate: 8.5 mmol/L — ABNORMAL LOW (ref 20.0–28.0)
Calcium, Ion: 0.82 mmol/L — CL (ref 1.15–1.40)
HCT: 22 % — ABNORMAL LOW (ref 36.0–46.0)
Hemoglobin: 7.5 g/dL — ABNORMAL LOW (ref 12.0–15.0)
O2 Saturation: 81 %
Patient temperature: 93
Potassium: 3.8 mmol/L (ref 3.5–5.1)
Sodium: 132 mmol/L — ABNORMAL LOW (ref 135–145)
TCO2: 9 mmol/L — ABNORMAL LOW (ref 22–32)
pCO2 arterial: 27 mmHg — ABNORMAL LOW (ref 32.0–48.0)
pH, Arterial: 7.087 — CL (ref 7.350–7.450)
pO2, Arterial: 51 mmHg — ABNORMAL LOW (ref 83.0–108.0)

## 2020-03-18 LAB — DIC (DISSEMINATED INTRAVASCULAR COAGULATION)PANEL
D-Dimer, Quant: 11.23 ug/mL-FEU — ABNORMAL HIGH (ref 0.00–0.50)
Fibrinogen: 130 mg/dL — ABNORMAL LOW (ref 210–475)
INR: 5.7 (ref 0.8–1.2)
Platelets: 109 10*3/uL — ABNORMAL LOW (ref 150–400)
Prothrombin Time: 49.8 seconds — ABNORMAL HIGH (ref 11.4–15.2)
Smear Review: NONE SEEN
aPTT: 57 seconds — ABNORMAL HIGH (ref 24–36)

## 2020-03-18 LAB — COMPREHENSIVE METABOLIC PANEL
ALT: 63 U/L — ABNORMAL HIGH (ref 0–44)
AST: 245 U/L — ABNORMAL HIGH (ref 15–41)
Albumin: 1.1 g/dL — ABNORMAL LOW (ref 3.5–5.0)
Alkaline Phosphatase: 35 U/L — ABNORMAL LOW (ref 38–126)
Anion gap: 30 — ABNORMAL HIGH (ref 5–15)
BUN: 12 mg/dL (ref 8–23)
CO2: 9 mmol/L — ABNORMAL LOW (ref 22–32)
Calcium: 7.1 mg/dL — ABNORMAL LOW (ref 8.9–10.3)
Chloride: 94 mmol/L — ABNORMAL LOW (ref 98–111)
Creatinine, Ser: 1.14 mg/dL — ABNORMAL HIGH (ref 0.44–1.00)
GFR, Estimated: 55 mL/min — ABNORMAL LOW (ref >60)
Glucose, Bld: 329 mg/dL — ABNORMAL HIGH (ref 70–99)
Potassium: 4.1 mmol/L (ref 3.5–5.1)
Sodium: 133 mmol/L — ABNORMAL LOW (ref 135–145)
Total Bilirubin: 3.4 mg/dL — ABNORMAL HIGH (ref 0.3–1.2)
Total Protein: <3 g/dL — ABNORMAL LOW (ref 6.5–8.1)

## 2020-03-18 LAB — BPAM PLATELET PHERESIS
Blood Product Expiration Date: 202201032359
Unit Type and Rh: 5100

## 2020-03-18 LAB — I-STAT VENOUS BLOOD GAS, ED
Acid-base deficit: 18 mmol/L — ABNORMAL HIGH (ref 0.0–2.0)
Bicarbonate: 10 mmol/L — ABNORMAL LOW (ref 20.0–28.0)
Calcium, Ion: 1.07 mmol/L — ABNORMAL LOW (ref 1.15–1.40)
HCT: 15 % — ABNORMAL LOW (ref 36.0–46.0)
Hemoglobin: 5.1 g/dL — CL (ref 12.0–15.0)
O2 Saturation: 92 %
Potassium: 4.2 mmol/L (ref 3.5–5.1)
Sodium: 133 mmol/L — ABNORMAL LOW (ref 135–145)
TCO2: 11 mmol/L — ABNORMAL LOW (ref 22–32)
pCO2, Ven: 34.5 mmHg — ABNORMAL LOW (ref 44.0–60.0)
pH, Ven: 7.068 — CL (ref 7.250–7.430)
pO2, Ven: 87 mmHg — ABNORMAL HIGH (ref 32.0–45.0)

## 2020-03-18 LAB — PREPARE PLATELET PHERESIS: Unit division: 0

## 2020-03-18 LAB — PROTIME-INR
INR: 4.5 (ref 0.8–1.2)
INR: 3.8 — ABNORMAL HIGH (ref 0.8–1.2)
Prothrombin Time: 41.6 seconds — ABNORMAL HIGH (ref 11.4–15.2)
Prothrombin Time: 36.4 seconds — ABNORMAL HIGH (ref 11.4–15.2)

## 2020-03-18 LAB — BPAM CRYOPRECIPITATE
Blood Product Expiration Date: 202201030535
ISSUE DATE / TIME: 202201022359
Unit Type and Rh: 6200

## 2020-03-18 LAB — PREPARE RBC (CROSSMATCH)

## 2020-03-18 LAB — LACTIC ACID, PLASMA
Lactic Acid, Venous: >11 mmol/L (ref 0.5–1.9)
Lactic Acid, Venous: >11 mmol/L (ref 0.5–1.9)

## 2020-03-18 LAB — AMMONIA: Ammonia: 66 umol/L — ABNORMAL HIGH (ref 9–35)

## 2020-03-18 LAB — PREPARE CRYOPRECIPITATE: Unit division: 0

## 2020-03-18 LAB — GLUCOSE, CAPILLARY: Glucose-Capillary: 152 mg/dL — ABNORMAL HIGH (ref 70–99)

## 2020-03-18 LAB — MRSA PCR SCREENING: MRSA by PCR: NEGATIVE

## 2020-03-18 LAB — FIBRINOGEN: Fibrinogen: <60 mg/dL — CL (ref 210–475)

## 2020-03-18 MED ORDER — SODIUM CHLORIDE 0.9% IV SOLUTION: Freq: Once | INTRAVENOUS | Status: DC

## 2020-03-18 MED ORDER — VASOPRESSIN 20 UNITS/100 ML INFUSION FOR SHOCK
0.0000 [IU]/min | INTRAVENOUS | Status: DC
  Administered 2020-03-18: 0.03 [IU]/min via INTRAVENOUS
  Filled 2020-03-18 (×2): qty 100

## 2020-03-18 MED ORDER — CALCIUM GLUCONATE-NACL 1-0.675 GM/50ML-% IV SOLN
1.0000 g | Freq: Once | INTRAVENOUS | Status: AC
  Administered 2020-03-18: 1000 mg via INTRAVENOUS
  Filled 2020-03-18: qty 50

## 2020-03-18 MED ORDER — NOREPINEPHRINE 4 MG/250ML-% IV SOLN
INTRAVENOUS | Status: AC
  Administered 2020-03-18: 4 mg
  Filled 2020-03-18: qty 250

## 2020-03-18 MED ORDER — EPINEPHRINE PF 1 MG/ML IJ SOLN: 0.5000 ug/min | INTRAVENOUS | Status: DC

## 2020-03-18 MED ORDER — SODIUM BICARBONATE 8.4 % IV SOLN
INTRAVENOUS | Status: AC
  Filled 2020-03-18: qty 50

## 2020-03-18 MED ORDER — THIAMINE HCL 100 MG/ML IJ SOLN: 100.0000 mg | Freq: Every day | INTRAMUSCULAR | Status: DC

## 2020-03-18 MED ORDER — SODIUM BICARBONATE 8.4 % IV SOLN
INTRAVENOUS | Status: AC
  Administered 2020-03-18: 100 meq via INTRAVENOUS
  Filled 2020-03-18: qty 100

## 2020-03-18 MED ORDER — HYDROCORTISONE NA SUCCINATE PF 100 MG IJ SOLR
50.0000 mg | Freq: Four times a day (QID) | INTRAMUSCULAR | Status: DC
  Administered 2020-03-18: 50 mg via INTRAVENOUS
  Filled 2020-03-18: qty 2

## 2020-03-18 MED ORDER — FENTANYL CITRATE (PF) 100 MCG/2ML IJ SOLN: 50.0000 ug | INTRAMUSCULAR | Status: DC | PRN

## 2020-03-18 MED ORDER — CALCIUM GLUCONATE 10 % IV SOLN: 1.0000 g | Freq: Once | INTRAVENOUS | Status: DC

## 2020-03-18 MED ORDER — MIDAZOLAM HCL 2 MG/2ML IJ SOLN: 2.0000 mg | Freq: Once | INTRAMUSCULAR | Status: DC

## 2020-03-18 MED ORDER — SODIUM BICARBONATE 8.4 % IV SOLN: 100.0000 meq | Freq: Once | INTRAVENOUS | Status: AC

## 2020-03-18 MED ORDER — NOREPINEPHRINE 4 MG/250ML-% IV SOLN
0.0000 ug/min | INTRAVENOUS | Status: DC
  Administered 2020-03-18: 90 ug/min via INTRAVENOUS
  Filled 2020-03-18: qty 250

## 2020-03-18 MED ORDER — EPINEPHRINE 0.1 MG/10ML (10 MCG/ML) SYRINGE FOR IV PUSH (FOR BLOOD PRESSURE SUPPORT)
5.0000 ug | PREFILLED_SYRINGE | Freq: Once | INTRAVENOUS | Status: DC | PRN
  Filled 2020-03-18: qty 10

## 2020-03-18 MED ORDER — CHLORHEXIDINE GLUCONATE CLOTH 2 % EX PADS: 6.0000 | MEDICATED_PAD | Freq: Every day | CUTANEOUS | Status: DC

## 2020-03-18 MED ORDER — DOCUSATE SODIUM 50 MG/5ML PO LIQD: 100.0000 mg | Freq: Two times a day (BID) | ORAL | Status: DC

## 2020-03-18 MED ORDER — EPINEPHRINE HCL 5 MG/250ML IV SOLN IN NS
0.5000 ug/min | INTRAVENOUS | Status: DC
  Administered 2020-03-18: 05:00:00 0.5 ug/min via INTRAVENOUS
  Filled 2020-03-18: qty 250

## 2020-03-18 MED ORDER — POLYETHYLENE GLYCOL 3350 17 G PO PACK: 17.0000 g | PACK | Freq: Every day | ORAL | Status: DC

## 2020-03-18 MED ORDER — NOREPINEPHRINE 16 MG/250ML-% IV SOLN
0.0000 ug/min | INTRAVENOUS | Status: DC
  Administered 2020-03-18: 40 ug/min via INTRAVENOUS
  Filled 2020-03-18: qty 250

## 2020-03-19 LAB — BPAM RBC
Blood Product Expiration Date: 202201202359
Blood Product Expiration Date: 202201202359
Blood Product Expiration Date: 202201262359
Blood Product Expiration Date: 202201262359
Blood Product Expiration Date: 202201272359
Blood Product Expiration Date: 202201282359
Blood Product Expiration Date: 202202012359
ISSUE DATE / TIME: 202201022320
ISSUE DATE / TIME: 202201022320
ISSUE DATE / TIME: 202201030059
ISSUE DATE / TIME: 202201030346
ISSUE DATE / TIME: 202201030346
ISSUE DATE / TIME: 202201031514
ISSUE DATE / TIME: 202201041104
Unit Type and Rh: 600
Unit Type and Rh: 600
Unit Type and Rh: 600
Unit Type and Rh: 600
Unit Type and Rh: 600
Unit Type and Rh: 600
Unit Type and Rh: 600

## 2020-03-19 LAB — PREPARE FRESH FROZEN PLASMA: Unit division: 0

## 2020-03-19 LAB — TYPE AND SCREEN
ABO/RH(D): A NEG
Antibody Screen: NEGATIVE
Unit division: 0
Unit division: 0
Unit division: 0
Unit division: 0
Unit division: 0
Unit division: 0
Unit division: 0

## 2020-03-19 LAB — BPAM FFP
Blood Product Expiration Date: 202201052359
Blood Product Expiration Date: 202201052359
Blood Product Expiration Date: 202201062359
ISSUE DATE / TIME: 202201030059
ISSUE DATE / TIME: 202201030059
ISSUE DATE / TIME: 202201030329
Unit Type and Rh: 600
Unit Type and Rh: 6200
Unit Type and Rh: 6200

## 2020-03-19 MED FILL — Medication: Qty: 1 | Status: AC

## 2020-03-23 LAB — CULTURE, BLOOD (ROUTINE X 2)
Culture: NO GROWTH
Special Requests: ADEQUATE

## 2020-04-16 NOTE — Procedures (Addendum)
Arterial Catheter Insertion Procedure Note  ELANE PEABODY  [id number]  09-14-1960  Date:03-25-20  Time:3:30 AM    Provider Performing: Omar Person    Procedure: Insertion of Arterial Line (76160) with US guidance (73710)   Indication(s) Blood pressure monitoring and/or need for frequent ABGs  Consent Risks were explained to patient and caregiver and verbal consent obtained.  Anesthesia None   Time Out Verified patient identification, verified procedure, site/side was marked, verified correct patient position, special equipment/implants available, medications/allergies/relevant history reviewed, required imaging and test results available.   Sterile Technique Maximal sterile technique including full sterile barrier drape, hand hygiene, sterile gown, sterile gloves, mask, hair covering, sterile ultrasound probe cover (if used).   Procedure Description Area of catheter insertion was cleaned with chlorhexidine and draped in sterile fashion. With real-time ultrasound guidance an arterial catheter was placed into the left femoral artery.  Appropriate arterial tracings confirmed on monitor.     Complications/Tolerance None; patient tolerated the procedure well.   EBL Minimal   Specimen(s) None

## 2020-04-16 NOTE — Death Summary Note (Signed)
DEATH SUMMARY   Patient Details  Name: Michelle Schneider MRN: [id number] DOB: [birth year]  Admission/Discharge Information   Admit Date:  04/15/2020  Date of Death:    Time of Death:    Length of Stay: 1  Referring Physician: Benancio Deeds, MD   Reason(s) for Hospitalization  Upper GI bleeding  Diagnoses  Preliminary cause of death: Hemorrhagic shock Secondary Diagnoses (including complications and co-morbidities):  Active Problems:   Hematemesis   Upper GI bleed   [facility])   Brief Hospital Course (including significant findings, care, treatment, and services provided and events leading to death)  60 year old female with past medical history of 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 She had a few episodes of bright red vomit today, and also a mix of dark blood, also endorsed increasing shortness of breath. Found to be hypoxic in the ED, with R sided infiltrate on CXR. She was also tachypneic, and ultimately needing non-rebreather, and needed intubation while in the ED. Intubation was difficult due to excessive amount of blood from GI tract leading to massive aspiration. Her course was complicated by worsening acute hypoxic respiratory failure from pneumonia and aspiration and profound multifactorial shock (hemorrhagic, sepsis, SIRS reaction to massive aspiration, liver disease) refractory to prompt blood product administration, early broad antibiotics, pressors and steroids. She was not felt to be candidate for emergent bedside EGD by GI due to futility of intervention in setting of coagulopathy refractory to attempts thus far to reverse (5U cryo, 2U FFP, 10 IV vitamin K), nor ideal candidate for blakemore/MN tube placement for temporization due to location of varices/recent banding. She unfortunately had PEA arrest and was pronounced dead at 6:54 AM. The patient's cousin was present at bedside and her POA  Evette Cristal was notified immediately.      Pertinent Labs and Studies  Significant Diagnostic Studies DG Chest 2 View  Result Date: 04-15-2020 CLINICAL DATA:  60 year old female with history of shortness of breath. Difficulty breathing. EXAM: CHEST - 2 VIEW COMPARISON:  Chest x-ray 03/09/2020. FINDINGS: Poorly defined opacities and areas of interstitial prominence are noted throughout the mid to lower lungs bilaterally (left greater than right), compatible with multilobar bilateral pneumonia. Elevation of the right hemidiaphragm with small right pleural effusion. No pneumothorax. No evidence of pulmonary edema. Heart size is normal. Upper mediastinal contours are within normal limits. Status post ORIF in the right proximal humerus. Surgical clips project over the expected location of gastroesophageal junction. IMPRESSION: 1. The appearance of the lungs is most compatible with severe multilobar bilateral pneumonia. Small right pleural effusion. Electronically Signed   By: Trudie Reed M.D.   On: 04/15/20 19:35   US Abdomen Limited  Result Date: 03/11/2020 CLINICAL DATA:  60 year old female with cirrhosis. EXAM: ULTRASOUND ABDOMEN LIMITED RIGHT UPPER QUADRANT COMPARISON:  Right upper quadrant ultrasound dated 03/01/2017. FINDINGS: Gallbladder: There are multiple stones within the gallbladder. The gallbladder wall is thickened and edematous measuring approximately 4 mm. Small pericholecystic fluid noted. These findings may be related to underlying liver disease. Clinical correlation is recommended. Negative sonographic Murphy's sign. Common bile duct: Diameter: 5 mm Liver: Morphologic changes of cirrhosis. No discrete mass identified. Portal vein is patent on color Doppler imaging with normal direction of blood flow towards the liver. Other: Small ascites. IMPRESSION: 1. Cirrhosis. 2. Cholelithiasis with equivocal sonographic findings for acute cholecystitis. A hepatobiliary scintigraphy may  provide better evaluation of the gallbladder if there is a high clinical concern  for acute cholecystitis . 3. Patent main portal vein with hepatopetal flow. 4. Small ascites. Electronically Signed   By: Elgie Collard M.D.   On: 03/11/2020 15:11   DG CHEST PORT 1 VIEW  Result Date: 04/02/2020 CLINICAL DATA:  Intubation EXAM: PORTABLE CHEST 1 VIEW COMPARISON:  2020/04/06 at 7:12 p.m. FINDINGS: Endotracheal tube tip is just below the level of the clavicular heads. Enteric tube courses below the diaphragm to the stomach. Multifocal airspace opacity, left-greater-than-right. IMPRESSION: Endotracheal tube tip at the level of the clavicular heads. Electronically Signed   By: Deatra Robinson M.D.   On: 03/20/2020 00:02   DG CHEST PORT 1 VIEW  Result Date: 03/09/2020 CLINICAL DATA:  Vomiting blood with dizziness and weakness. EXAM: PORTABLE CHEST 1 VIEW COMPARISON:  December 22, 2018 FINDINGS: A right internal jugular venous catheter is seen. Its distal tip extends laterally across the right apex and sits along the right axilla. There is no evidence of acute infiltrate, pleural effusion or pneumothorax. The heart size and mediastinal contours are within normal limits. The visualized skeletal structures are unremarkable. IMPRESSION: 1. Malpositioned right internal jugular venous catheter, without evidence of acute cardiopulmonary disease. Electronically Signed   By: Aram Candela M.D.   On: 03/09/2020 23:16   DG Abd Portable 1V  Result Date: 2020-04-06 CLINICAL DATA:  Orogastric tube placement EXAM: PORTABLE ABDOMEN - 1 VIEW COMPARISON:  None. FINDINGS: Tip and side port of the orogastric tube project within the stomach. No dilated bowel visible. IMPRESSION: Orogastric tube tip and side port in the stomach. Electronically Signed   By: Deatra Robinson M.D.   On: Apr 06, 2020 23:49   DG Abd Portable 1V  Result Date: 03/14/2020 CLINICAL DATA:  Lower abdominal pain and distention. EXAM: PORTABLE ABDOMEN - 1 VIEW  COMPARISON:  August 23, 2015. FINDINGS: The bowel gas pattern is normal. No radio-opaque calculi or other significant radiographic abnormality are seen. IMPRESSION: Negative. Electronically Signed   By: Lupita Raider M.D.   On: 03/14/2020 13:21   ECHOCARDIOGRAM COMPLETE  Result Date: 03/10/2020    ECHOCARDIOGRAM REPORT   Patient Name:   TALISHIA BETZLER Date of Exam: 03/10/2020 Medical Rec #:  [id number]         Height:       62.0 in Accession #:    [id number]        Weight:       92.0 lb Date of Birth:  [birth year]          BSA:          1.374 m Patient Age:    59 years          BP:           117/61 mmHg Patient Gender: F                 HR:           80 bpm. Exam Location:  Inpatient Procedure: 2D Echo, Cardiac Doppler and Color Doppler Indications:    Murmur 785.2 / R01.1  History:        Patient has no prior history of Echocardiogram examinations.                 Risk Factors:Dyslipidemia. Raynaud's disease (From Hx).  Sonographer:    Celesta Gentile RCS Referring Phys: [id number] MANUEL E IZQUIERDO IMPRESSIONS  1. Left ventricular ejection fraction, by estimation, is 70 to 75%. The left ventricle has hyperdynamic function. The left ventricle has no regional  wall motion abnormalities. Left ventricular diastolic parameters are consistent with Grade I diastolic dysfunction (impaired relaxation).  2. Right ventricular systolic function is hyperdynamic. The right ventricular size is normal. There is normal pulmonary artery systolic pressure.  3. Left atrial size was mildly dilated.  4. Right atrial size was mildly dilated.  5. The mitral valve is normal in structure. No evidence of mitral valve regurgitation. No evidence of mitral stenosis.  6. The aortic valve is tricuspid. There is mild calcification of the aortic valve. Aortic valve regurgitation is not visualized. Mild to moderate aortic valve sclerosis/calcification is present, without any evidence of aortic stenosis.  7. The inferior vena cava is normal in size  with greater than 50% respiratory variability, suggesting right atrial pressure of 3 mmHg. FINDINGS  Left Ventricle: Left ventricular ejection fraction, by estimation, is 70 to 75%. The left ventricle has hyperdynamic function. The left ventricle has no regional wall motion abnormalities. The left ventricular internal cavity size was normal in size. There is no left ventricular hypertrophy. Left ventricular diastolic parameters are consistent with Grade I diastolic dysfunction (impaired relaxation). Right Ventricle: The right ventricular size is normal. No increase in right ventricular wall thickness. Right ventricular systolic function is hyperdynamic. There is normal pulmonary artery systolic pressure. The tricuspid regurgitant velocity is 2.53 m/s, and with an assumed right atrial pressure of 3 mmHg, the estimated right ventricular systolic pressure is 28.6 mmHg. Left Atrium: Left atrial size was mildly dilated. Right Atrium: Right atrial size was mildly dilated. Pericardium: There is no evidence of pericardial effusion. Mitral Valve: The mitral valve is normal in structure. No evidence of mitral valve regurgitation. No evidence of mitral valve stenosis. Tricuspid Valve: The tricuspid valve is normal in structure. Tricuspid valve regurgitation is mild . No evidence of tricuspid stenosis. Aortic Valve: The aortic valve is tricuspid. There is mild calcification of the aortic valve. Aortic valve regurgitation is not visualized. Mild to moderate aortic valve sclerosis/calcification is present, without any evidence of aortic stenosis. Pulmonic Valve: The pulmonic valve was grossly normal. Pulmonic valve regurgitation is trivial. No evidence of pulmonic stenosis. Aorta: The aortic root is normal in size and structure. Venous: The inferior vena cava is normal in size with greater than 50% respiratory variability, suggesting right atrial pressure of 3 mmHg. IAS/Shunts: The interatrial septum appears to be lipomatous. No  atrial level shunt detected by color flow Doppler.  LEFT VENTRICLE PLAX 2D LVIDd:         3.70 cm  Diastology LVIDs:         1.60 cm  LV e' medial:    6.09 cm/s LV PW:         0.70 cm  LV E/e' medial:  13.4 LV IVS:        1.00 cm  LV e' lateral:   5.98 cm/s LVOT diam:     1.80 cm  LV E/e' lateral: 13.6 LV SV:         76 LV SV Index:   56 LVOT Area:     2.54 cm  RIGHT VENTRICLE RV S prime:     17.90 cm/s TAPSE (M-mode): 2.4 cm LEFT ATRIUM             Index       RIGHT ATRIUM           Index LA diam:        2.90 cm 2.11 cm/m  RA Area:     10.80 cm LA Vol (A2C):  32.0 ml 23.28 ml/m RA Volume:   24.80 ml  18.05 ml/m LA Vol (A4C):   34.2 ml 24.85 ml/m LA Biplane Vol: 34.4 ml 25.03 ml/m  AORTIC VALVE LVOT Vmax:   157.50 cm/s LVOT Vmean:  108.000 cm/s LVOT VTI:    0.300 m  AORTA Ao Root diam: 3.00 cm MITRAL VALVE                TRICUSPID VALVE MV Area (PHT): 2.56 cm     TR Peak grad:   25.6 mmHg MV Decel Time: 296 msec     TR Vmax:        253.00 cm/s MV E velocity: 81.50 cm/s MV A velocity: 117.00 cm/s  SHUNTS MV E/A ratio:  0.70         Systemic VTI:  0.30 m                             Systemic Diam: 1.80 cm Arvilla Meresaniel Bensimhon MD Electronically signed by Arvilla Meresaniel Bensimhon MD Signature Date/Time: 03/10/2020/8:22:55 PM    Final     Microbiology Recent Results (from the past 240 hour(s))  Resp Panel by RT-PCR (Flu A&B, Covid) Nasopharyngeal Swab     Status: None   Collection Time: 03/09/20  5:48 PM   Specimen: Nasopharyngeal Swab; Nasopharyngeal(NP) swabs in vial transport medium  Result Value Ref Range Status   SARS Coronavirus 2 by RT PCR NEGATIVE NEGATIVE Final    Comment: (NOTE) SARS-CoV-2 target nucleic acids are NOT DETECTED.  The SARS-CoV-2 RNA is generally detectable in upper respiratory specimens during the acute phase of infection. The lowest concentration of SARS-CoV-2 viral copies this assay can detect is 138 copies/mL. A negative result does not preclude SARS-Cov-2 infection and should not  be used as the sole basis for treatment or other patient management decisions. A negative result may occur with  improper specimen collection/handling, submission of specimen other than nasopharyngeal swab, presence of viral mutation(s) within the areas targeted by this assay, and inadequate number of viral copies(<138 copies/mL). A negative result must be combined with clinical observations, patient history, and epidemiological information. The expected result is Negative.  Fact Sheet for Patients:  BloggerCourse.comhttps://www.fda.gov/media/152166/download  Fact Sheet for Healthcare Providers:  SeriousBroker.ithttps://www.fda.gov/media/152162/download  This test is no t yet approved or cleared by the [place] FDA and  has been authorized for detection and/or diagnosis of SARS-CoV-2 by FDA under an Emergency Use Authorization (EUA). This EUA will remain  in effect (meaning this test can be used) for the duration of the COVID-19 declaration under Section 564(b)(1) of the Act, 21 U.S.C.section 360bbb-3(b)(1), unless the authorization is terminated  or revoked sooner.       Influenza A by PCR NEGATIVE NEGATIVE Final   Influenza B by PCR NEGATIVE NEGATIVE Final    Comment: (NOTE) The Xpert Xpress SARS-CoV-2/FLU/RSV plus assay is intended as an aid in the diagnosis of influenza from Nasopharyngeal swab specimens and should not be used as a sole basis for treatment. Nasal washings and aspirates are unacceptable for Xpert Xpress SARS-CoV-2/FLU/RSV testing.  Fact Sheet for Patients: BloggerCourse.comhttps://www.fda.gov/media/152166/download  Fact Sheet for Healthcare Providers: SeriousBroker.ithttps://www.fda.gov/media/152162/download  This test is not yet approved or cleared by the [place] FDA and has been authorized for detection and/or diagnosis of SARS-CoV-2 by FDA under an Emergency Use Authorization (EUA). This EUA will remain in effect (meaning this test can be used) for the duration of the COVID-19 declaration under Section  564(b)(1) of the Act, 21 U.S.C. section 360bbb-3(b)(1), unless the authorization is terminated or revoked.  Performed at [facility], [address], [place], [place] [address]   MRSA PCR Screening     Status: None   Collection Time: 03/10/20 12:16 AM   Specimen: Nasal Mucosa; Nasopharyngeal  Result Value Ref Range Status   MRSA by PCR NEGATIVE NEGATIVE Final    Comment:        The GeneXpert MRSA Assay (FDA approved for NASAL specimens only), is one component of a comprehensive MRSA colonization surveillance program. It is not intended to diagnose MRSA infection nor to guide or monitor treatment for MRSA infections. Performed at [facility], [address] [place], [place] [address]   Culture, blood (routine x 2)     Status: None   Collection Time: 03/10/20  5:43 AM   Specimen: BLOOD LEFT HAND  Result Value Ref Range Status   Specimen Description BLOOD LEFT HAND  Final   Special Requests   Final    BOTTLES DRAWN AEROBIC AND ANAEROBIC Blood Culture adequate volume   Culture   Final    NO GROWTH 5 DAYS Performed at [facility], [address] [place], [place] [address]    Report Status 03/15/2020 FINAL  Final  Culture, blood (routine x 2)     Status: None   Collection Time: 03/10/20  5:43 AM   Specimen: BLOOD RIGHT HAND  Result Value Ref Range Status   Specimen Description BLOOD RIGHT HAND  Final   Special Requests   Final    BOTTLES DRAWN AEROBIC ONLY Blood Culture results may not be optimal due to an inadequate volume of blood received in culture bottles   Culture   Final    NO GROWTH 5 DAYS Performed at [facility], [address], [place], [place] [address]    Report Status 03/15/2020 FINAL  Final  Resp Panel by RT-PCR (Flu A&B, Covid) Nasopharyngeal Swab     Status: None   Collection Time: 04/09/2020  9:13 PM   Specimen: Nasopharyngeal Swab; Nasopharyngeal(NP) swabs in vial transport medium  Result Value Ref Range Status   SARS  Coronavirus 2 by RT PCR NEGATIVE NEGATIVE Final    Comment: (NOTE) SARS-CoV-2 target nucleic acids are NOT DETECTED.  The SARS-CoV-2 RNA is generally detectable in upper respiratory specimens during the acute phase of infection. The lowest concentration of SARS-CoV-2 viral copies this assay can detect is 138 copies/mL. A negative result does not preclude SARS-Cov-2 infection and should not be used as the sole basis for treatment or other patient management decisions. A negative result may occur with  improper specimen collection/handling, submission of specimen other than nasopharyngeal swab, presence of viral mutation(s) within the areas targeted by this assay, and inadequate number of viral copies(<138 copies/mL). A negative result must be combined with clinical observations, patient history, and epidemiological information. The expected result is Negative.  Fact Sheet for Patients:  BloggerCourse.com  Fact Sheet for Healthcare Providers:  SeriousBroker.it  This test is no t yet approved or cleared by the [place] FDA and  has been authorized for detection and/or diagnosis of SARS-CoV-2 by FDA under an Emergency Use Authorization (EUA). This EUA will remain  in effect (meaning this test can be used) for the duration of the COVID-19 declaration under Section 564(b)(1) of the Act, 21 U.S.C.section 360bbb-3(b)(1), unless the authorization is terminated  or revoked sooner.       Influenza  A by PCR NEGATIVE NEGATIVE Final   Influenza B by PCR NEGATIVE NEGATIVE Final    Comment: (NOTE) The Xpert Xpress SARS-CoV-2/FLU/RSV plus assay is intended as an aid in the diagnosis of influenza from Nasopharyngeal swab specimens and should not be used as a sole basis for treatment. Nasal washings and aspirates are unacceptable for Xpert Xpress SARS-CoV-2/FLU/RSV testing.  Fact Sheet for  Patients: EntrepreneurPulse.com.au  Fact Sheet for Healthcare Providers: IncredibleEmployment.be  This test is not yet approved or cleared by the [place] FDA and has been authorized for detection and/or diagnosis of SARS-CoV-2 by FDA under an Emergency Use Authorization (EUA). This EUA will remain in effect (meaning this test can be used) for the duration of the COVID-19 declaration under Section 564(b)(1) of the Act, 21 U.S.C. section 360bbb-3(b)(1), unless the authorization is terminated or revoked.  Performed at [facility], [place] [address], [place], [place] [address]   MRSA PCR Screening     Status: None   Collection Time: 03/22/2020  2:04 AM   Specimen: Nasal Mucosa; Nasopharyngeal  Result Value Ref Range Status   MRSA by PCR NEGATIVE NEGATIVE Final    Comment:        The GeneXpert MRSA Assay (FDA approved for NASAL specimens only), is one component of a comprehensive MRSA colonization surveillance program. It is not intended to diagnose MRSA infection nor to guide or monitor treatment for MRSA infections. Performed at [facility], [place] [address], [place], [place] [address]     Lab Basic Metabolic Panel: Recent Labs  Lab 03/12/20 [phone] 03/13/20 0136 2020/04/03 1947 04-03-2020 2005 2020-04-03 2135 03/31/2020 0003 04/06/2020 0200 03/30/2020 0453  NA 134* 133* 131* 130* 131* 133* 132* 133*  K 3.5 3.6 4.3 4.1 4.2 4.2 3.8 4.1  CL 104 102 96* 97*  --   --   --  94*  CO2 23 23 9*  --   --   --   --  9*  GLUCOSE 98 90 63* 54*  --   --   --  329*  BUN 8 5* 13 16  --   --   --  12  CREATININE 0.52 0.50 1.04* 0.70  --   --   --  1.14*  CALCIUM 8.1* 8.2* 9.5  --   --   --   --  7.1*  MG 1.8  --   --   --   --   --   --   --   PHOS 2.5  --   --   --   --   --   --   --    Liver Function Tests: Recent Labs  Lab 03/13/20 0136 03-Apr-2020 1947 03/26/2020 0453  AST 152* 128* 245*  ALT 53* 51* 63*  ALKPHOS 51 92 35*  BILITOT  9.5* 10.3* 3.4*  PROT 5.3* 5.9* <3.0*  ALBUMIN 2.3* 2.3* 1.1*   No results for input(s): LIPASE, AMYLASE in the last 168 hours. Recent Labs  Lab 04/03/2020 0002  AMMONIA 66*   CBC: Recent Labs  Lab 03/12/20 0512 03/13/20 0136 04/03/20 0005 Apr 03, 2020 0006 Apr 03, 2020 1947 Apr 03, 2020 2005 04-03-20 2135 03/27/2020 0003 04/06/2020 0130 03/24/2020 0200  WBC 4.3 3.6*  --  16.5* 16.2*  --   --   --  4.9  --   HGB 8.1* 8.5*  --  5.2* 9.1* 11.2* 6.5* 5.1* 8.7* 7.5*  HCT 24.2* 24.3*  --  18.4* 30.1* 33.0* 19.0* 15.0* 28.2* 22.0*  MCV 94.9 94.6  --  117.9* 111.1*  --   --   --  100.4*  --   PLT 42* 39* 109* 107* 146*  --   --   --  49*  --    Cardiac Enzymes: No results for input(s): CKTOTAL, CKMB, CKMBINDEX, TROPONINI in the last 168 hours. Sepsis Labs: Recent Labs  Lab 03/13/20 0136 03/27/2020 0003 04/14/2020 0006 04/02/2020 1941 03/24/2020 1947 April 16, 2020 0130  WBC 3.6*  --  16.5*  --  16.2* 4.9  LATICACIDVEN  --  >11.0*  --  >11.0*  --   --     Procedures/Operations  04-16-2020: ETT placement, central line, arterial line  Omar Person 2020-04-16, 7:24 AM

## 2020-04-16 NOTE — Progress Notes (Signed)
   04/14/2020 0648  Clinical Encounter Type  Visited With Family  Visit Type Code  Referral From Nurse  Consult/Referral To Chaplain  Spiritual Encounters  Spiritual Needs Emotional;Grief support  Chaplain responded to Code Blue for Ms. Dascenzo who came to end of life.  Her cousin Teddy Spike was present. Offered comfort and support.  Ms. Earl Lites was given a Patient Placement card and explained the process after Ms. Fiorentino body is taken to the morgue.    At this time a funeral home was not given to be placed on file.  Ms Earl Lites stated " the staff and medical team was very kind, from the ED admission up until Ms. Mctier end of life.  Very professional, the steps and precaution taken to keep patients, staff and family members safe during the Covid Pandemic she was very impressed and grateful."  Chaplain Valyncia Wiens Morgan-Simpson [phone]

## 2020-04-16 NOTE — Progress Notes (Signed)
Paitient received a total of three units of RBC's (and an additional two units in the ED), and three units of plasma. Patient on Levophed, Vasopressin, Epinephrine, and Bicarb drips. Patient's blood pressure began to drop with systolic in the 50's, Elink notified and amps of bicarb IV pushes ordered per MD. Patient cardiac arrested PEA at 0647, and code blue called. Patients cousin at the bedside. Three rounds of CPR performed and time of death called by MD at 0654. See code sheet.

## 2020-04-16 NOTE — Code Documentation (Signed)
  Patient Name: Michelle Schneider   MRN: [id number]   Date of Birth/ Sex: 1960-11-30 , female      Admission Date: Apr 07, 2020  Attending Provider: Omar Person, MD  Primary Diagnosis: Hematemesis [K92.0] Upper GI bleed [K92.2] Encounter for orogastric (OG) tube placement [Z46.59] History of ETT [Z92.89] Pneumonia due to infectious organism, unspecified laterality, unspecified part of lung [J18.9] Hematemesis, presence of nausea not specified [K92.0] Sepsis, due to unspecified organism, unspecified whether acute organ dysfunction present [facility]) [A41.9]   Indication: Pt was in her usual state of health until this AM, when she was noted to be in PEA. Code blue was subsequently called. At the time of arrival on scene, ACLS protocol was underway.   Technical Description:  - CPR performance duration:  6 minutes  - Was defibrillation or cardioversion used? No   - Was external pacer placed? No  - Was patient intubated pre/post CPR? Yes   Medications Administered: Y = Yes; Blank = No Amiodarone    Atropine    Calcium    Epinephrine  Y  Lidocaine    Magnesium    Norepinephrine    Phenylephrine    Sodium bicarbonate  Y  Vasopressin    Other    Post CPR evaluation:  - Final Status - Was patient successfully resuscitated ? No   Miscellaneous Information:  - Time of death:  06:54 AM  - Primary team notified?  No  - Family Notified? Yes     Doran Stabler, DO   04/03/2020, 8:02 AM

## 2020-04-16 NOTE — Progress Notes (Addendum)
eLink Physician-Brief Progress Note Patient Name: Michelle Schneider DOB: November 09, 1960 MRN: [id number]   Date of Service  04/07/2020  HPI/Events of Note  Request for additional RBC transfusion since patient is persistently hypotensive. Patient is now on 90 mic/min levophed and vaso.   eICU Interventions  Add epinephrine, additional 2 units RBC ordered and RN will do post transfusion CBC after that. Platelets have been ordered already, FFP and cryo also given. Notified bedside CCM as well.      Intervention Category Major Interventions: Hemorrhage - evaluation and management  Sunil G Kamat 04/01/2020, 3:42 AM   6:15 am SBP 85 Increasing the levophed dose now (was initially at 90 mic/min but now at 40 mic/min), HR is 82 Is max'd out on epinephrine and vasopressin  In addition, has finished 5 units RBC and her FFP/cryo already RN is clarifying if platelet transfusion was completed Last ABG was at 2 am after which she got bicarb push x 1 per bedside CCM Push another 2 amps of bicarb as most recent bicarb remains 9, check ABG and lactate  Check repeat CBC, along with fibrinogen Patient is not sedated and remains on 100% fio2, discussed with admitting CCM MD who had already discussed poor  prognosis with family D/w RN

## 2020-04-16 NOTE — Procedures (Signed)
Intubation Procedure Note  MEE MACDONNELL  [id number]  12/03/1960  Date:04-13-20  Time:3:18 AM   Provider Performing:Castor Gittleman M Thora Lance    Procedure: Intubation (31500)  Indication(s) Respiratory Failure  Consent Unable to obtain consent due to emergent nature of procedure.   Anesthesia Etomidate and Rocuronium   Time Out Verified patient identification, verified procedure, site/side was marked, verified correct patient position, special equipment/implants available, medications/allergies/relevant history reviewed, required imaging and test results available.   Sterile Technique Usual hand hygeine, masks, and gloves were used   Procedure Description Patient positioned in bed supine, O2 saturation mid 80s on NRB.  Sedation given as noted above, initially hopeful to take first look without resorting to BVM. Unfortunately, desaturated immediately following sedation to 70s, requiring BVM and then aspirated large volume hematemesis which completely filled oropharynx. Patient was intubated with endotracheal tube using Glidescope s3.  Grade 1 full glottis after suctioning hematemesis.  Number of attempts was 1.  Colorimetric CO2 detector was consistent with tracheal placement.   Complications/Tolerance Aspiration/hematemesis. Despite use of phenylephrine 100 mcg immediately following sedation, she developed shock which was likely multifactorial. Chest X-ray is ordered to verify placement.   EBL Unrelated to ETT placement   Specimen(s) None

## 2020-04-16 NOTE — Procedures (Signed)
Central Venous Catheter Insertion Procedure Note  Michelle Schneider  [id number]  [birth year]  Date:04/15/2020  Time:3:28 AM   Provider Performing:Juanito Gonyer Judie Petit Thora Lance   Procedure: Insertion of Non-tunneled Central Venous Catheter(36556) with US guidance (35329)   Indication(s) Medication administration and Difficult access  Consent Verbally obtained after discussion of risks/benefits with patient and caregiver.  Anesthesia Topical only with 1% lidocaine   Timeout Verified patient identification, verified procedure, site/side was marked, verified correct patient position, special equipment/implants available, medications/allergies/relevant history reviewed, required imaging and test results available.  Sterile Technique Maximal sterile technique including full sterile barrier drape, hand hygiene, sterile gown, sterile gloves, mask, hair covering, sterile ultrasound probe cover (if used).  Procedure Description Area of catheter insertion was cleaned with chlorhexidine and draped in sterile fashion.  With real-time ultrasound guidance a 59F central line was placed into the right femoral vein. Nonpulsatile blood flow and easy flushing noted in all ports.  The catheter was sutured in place and sterile dressing applied.  Complications/Tolerance None; patient tolerated the procedure well. Chest X-ray is ordered to verify placement for internal jugular or subclavian cannulation.   Chest x-ray is not ordered for femoral cannulation.  EBL Minimal  Specimen(s) None

## 2020-04-16 NOTE — Consult Note (Signed)
[facility] Referring Provider: CCM Primary Care Physician:   Primary Gastroenterologist:  Dr. 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  She underwent an EGD 1 week ago for hematemesis and was fouind to have esophageal varices as well as abnormal GE junction mucosa (MW or dieulafoy lesion). The abnormal mucosa was clipped and three ligating bands were placed on the varices.  She was treated with octreotide and protonix, IV abx.  The bleeding did not recur and she recovered well enough to go home on 12/30. She was visited by her POA this afternoon and they thought she was breathing poorly and she had some hematemesis and so they brought her to the ER.  In ER she was hypotensive, tachypneic.  CXR suggested 'severe bilateral pneumonia'. She was vomiting red blood. Was eventually intubated and started on pressors.  IV PPI, IV Octreotide, IV abx (cefipime and vanc) as well as 2 units PRBC, one unit cryo, Vit K10units, She is getting 2 units FFP now and another unit of blood is going to be transfused.  Admitting Hb 9.1 and then ABG Hbs have been 6.5 and 5.1.  CBC Hb 8.7 at 0130 (after two units PRPC) Admitting INR 3.0 and then 5.7 on DIC panel that may be spurious, repeat INR 4.5 (before FFP, after cryo 1 unit) Admitting MELD last night 28, MELD-Na 30  She is currently on levophed and vasopressin  Past Medical History:  Diagnosis Date  . Autoimmune hepatitis (HCC)   . Cirrhosis (HCC)   . Esophageal varices (HCC)   . Family history of adverse reaction to anesthesia    cousin had a difficult time waking up after appendectomy  . Heart murmur    "diagnosed with a very slight heart murmur in 2001 but no other doctor has been able to hear it"  . Hyperlipidemia   . Raynaud's disease     Past Surgical History:  Procedure Laterality Date  . ABDOMINAL HYSTERECTOMY    . CRANIOTOMY Left  12/22/2018   Procedure: CRANIOTOMY HEMATOMA EVACUATION SUBDURAL;  Surgeon: Maeola Harman, MD;  Location: [facility] OR;  Service: Neurosurgery;  Laterality: Left;  . ESOPHAGEAL BANDING  03/10/2020   Procedure: ESOPHAGEAL BANDING;  Surgeon: Benancio Deeds, MD;  Location: [facility] ENDOSCOPY;  Service: Gastroenterology;;  . ESOPHAGOGASTRODUODENOSCOPY (EGD) WITH PROPOFOL N/A 06/15/2017   Procedure: ESOPHAGOGASTRODUODENOSCOPY (EGD) WITH PROPOFOL;  Surgeon: Benancio Deeds, MD;  Location: [facility] ENDOSCOPY;  Service: Gastroenterology;  Laterality: N/A;  . ESOPHAGOGASTRODUODENOSCOPY (EGD) WITH PROPOFOL N/A 03/10/2020   Procedure: ESOPHAGOGASTRODUODENOSCOPY (EGD) WITH PROPOFOL;  Surgeon: Benancio Deeds, MD;  Location: [facility] ENDOSCOPY;  Service: Gastroenterology;  Laterality: N/A;  . HEMOSTASIS CLIP PLACEMENT  03/10/2020   Procedure: HEMOSTASIS CLIP PLACEMENT;  Surgeon: Benancio Deeds, MD;  Location: [facility] ENDOSCOPY;  Service: Gastroenterology;;  . ORIF HUMERUS FRACTURE Right 05/23/2015   Procedure: OPEN REDUCTION INTERNAL FIXATION (ORIF) RIGHT  PROXIMAL HUMERUS FRACTURE;  Surgeon: Francena Hanly, MD;  Location: [facility] OR;  Service: Orthopedics;  Laterality: Right;  . WISDOM TOOTH EXTRACTION      Prior to Admission medications   Medication Sig Start Date End Date Taking? Authorizing Provider  docusate sodium (COLACE) 100 MG capsule Take 1 capsule (100 mg total) by mouth 2 (two) times daily. 03/14/20   Hollice Espy, MD  feeding supplement (ENSURE ENLIVE / ENSURE PLUS) LIQD Take 237 mLs by mouth 2 (two) times daily between meals. 03/14/20   Rito Ehrlich,  Trenton Gammon, MD  folic acid (FOLVITE) 1 MG tablet Take 1 tablet (1 mg total) by mouth daily. 03/15/20   Annita Brod, MD  lactulose (CHRONULAC) 10 GM/15ML solution Take 30 mLs (20 g total) by mouth 2 (two) times daily. 03/14/20   Annita Brod, MD  Lavender Oil OIL Apply 1 application topically 2 (two) times daily.    [provider]  Lovell Sheehan OIL  Take 6 drops by mouth daily.    [provider]  Multiple Vitamin (MULTIVITAMIN WITH MINERALS) TABS tablet Take 1 tablet by mouth daily. 03/15/20   Annita Brod, MD  NON FORMULARY Place 2 drops under the tongue daily. CBD Oil Doterra    [provider]  ORANGE PO Take 5 drops by mouth daily.    [provider]  OVER THE COUNTER MEDICATION Take 3 tablets by mouth 2 (two) times daily. Doterra Lifelong Vitality Pack    [provider]  OVER THE COUNTER MEDICATION Apply 1 application topically 2 (two) times daily as needed (essential oil supplement for digestive).    [provider]  rifaximin (XIFAXAN) 550 MG TABS tablet Take 1 tablet (550 mg total) by mouth 2 (two) times daily. 03/14/20   Annita Brod, MD  thiamine 100 MG tablet Take 1 tablet (100 mg total) by mouth daily. 03/15/20   Annita Brod, MD  traMADol (ULTRAM) 50 MG tablet Take 1 tablet (50 mg total) by mouth every 6 (six) hours as needed for moderate pain. 03/14/20   Annita Brod, MD    Current Facility-Administered Medications  Medication Dose Route Frequency Provider Last Rate Last Admin  . 0.9 %  sodium chloride infusion (Manually program via Guardrails IV Fluids)   Intravenous Once Maryjane Hurter, MD      . 0.9 %  sodium chloride infusion (Manually program via Guardrails IV Fluids)   Intravenous Once Maryjane Hurter, MD      . 0.9 %  sodium chloride infusion (Manually program via Guardrails IV Fluids)   Intravenous Once Maryjane Hurter, MD      . 0.9 %  sodium chloride infusion (Manually program via Guardrails IV Fluids)   Intravenous Once Maryjane Hurter, MD      . 0.9 %  sodium chloride infusion (Manually program via Guardrails IV Fluids)   Intravenous Once Maryjane Hurter, MD      . 0.9 %  sodium chloride infusion  10 mL/hr Intravenous Once Lucrezia Starch, MD   Held at 28-Mar-2020 2155  . ceFEPIme (MAXIPIME) 2 g in sodium chloride 0.9 % 100 mL IVPB   2 g Intravenous Q12H Lucrezia Starch, MD      . docusate (COLACE) 50 MG/5ML liquid 100 mg  100 mg Per Tube BID Maryjane Hurter, MD      . docusate sodium (COLACE) capsule 100 mg  100 mg Oral BID PRN Maryjane Hurter, MD      . etomidate (AMIDATE) 2 MG/ML injection           . fentaNYL (SUBLIMAZE) injection 25 mcg  25 mcg Intravenous Once Lucrezia Starch, MD      . fentaNYL (SUBLIMAZE) injection 50 mcg  50 mcg Intravenous Q15 min PRN Maryjane Hurter, MD      . fentaNYL (SUBLIMAZE) injection 50-200 mcg  50-200 mcg Intravenous Q30 min PRN Maryjane Hurter, MD      . hydrocortisone sodium succinate (SOLU-CORTEF) 100 MG injection 50  mg  50 mg Intravenous Q6H Omar Person, MD      . norepinephrine (LEVOPHED) 4-5 MG/250ML-% infusion SOLN           . octreotide (SANDOSTATIN) 500 mcg in sodium chloride 0.9 % 250 mL (2 mcg/mL) infusion  50 mcg/hr Intravenous Continuous Milagros Loll, MD   Paused at 04/03/2020 2323  . pantoprazole (PROTONIX) 80 mg in sodium chloride 0.9 % 100 mL (0.8 mg/mL) infusion  8 mg/hr Intravenous Continuous Milagros Loll, MD      . phenylephrine 0.4-0.9 MG/10ML-% injection           . phytonadione (VITAMIN K) 10 mg in dextrose 5 % 50 mL IVPB  10 mg Intravenous Once Omar Person, MD      . polyethylene glycol (MIRALAX / GLYCOLAX) packet 17 g  17 g Oral Daily PRN Omar Person, MD      . polyethylene glycol (MIRALAX / GLYCOLAX) packet 17 g  17 g Per Tube Daily Omar Person, MD      . rocuronium bromide 100 MG/10ML SOSY           . rocuronium bromide 100 MG/10ML SOSY           . sodium bicarbonate 1 mEq/mL injection           . sodium bicarbonate 150 mEq in dextrose 5 % 1,000 mL infusion   Intravenous Continuous Omar Person, MD      . succinylcholine (ANECTINE) 200 MG/10ML syringe           . vancomycin (VANCOCIN) IVPB 1000 mg/200 mL premix  1,000 mg Intravenous Q24H Milagros Loll, MD      . vasopressin (PITRESSIN) 20 Units in  sodium chloride 0.9 % 100 mL infusion-*FOR SHOCK*  0-0.03 Units/min Intravenous Continuous Omar Person, MD        Allergies as of 04/03/2020 - Review Complete 03/10/2020  Allergen Reaction Noted  . Oxycodone-acetaminophen Nausea And Vomiting and Other (See Comments) 05/17/2015    Family History  Problem Relation Age of Onset  . Dementia Father   . Parkinson's disease Mother   . Stroke Brother   . Colon cancer Neg Hx   . Esophageal cancer Neg Hx   . Pancreatic cancer Neg Hx   . Rectal cancer Neg Hx   . Stomach cancer Neg Hx     Social History   Socioeconomic History  . Marital status: Divorced    Spouse name: Not on file  . Number of children: Not on file  . Years of education: Not on file  . Highest education level: Not on file  Occupational History  . Occupation: N/A  Tobacco Use  . Smoking status: Never Smoker  . Smokeless tobacco: Never Used  Vaping Use  . Vaping Use: Never used  Substance and Sexual Activity  . Alcohol use: Yes    Alcohol/week: 0.0 standard drinks    Comment: occasional  . Drug use: No  . Sexual activity: Not Currently  Other Topics Concern  . Not on file  Social History Narrative  . Not on file   Social Determinants of Health   Financial Resource Strain: Not on file  Food Insecurity: Not on file  Transportation Needs: Not on file  Physical Activity: Not on file  Stress: Not on file  Social Connections: Not on file  Intimate Partner Violence: Not on file   Review of Systems: Pertinent positive and negative review of systems were  noted in the above HPI section. Complete review of systems was performed and was otherwise normal.  Physical Exam: Vital signs in last 24 hours: Temp:  [97.7 F (36.5 C)] 97.7 F (36.5 C) (01/02 1930) Pulse Rate:  [110-130] 119 (01/03 0030) Resp:  [24-32] 32 (01/03 0030) BP: (59-152)/(46-92) 118/81 (01/03 0030) SpO2:  [82 %-100 %] 82 % (01/03 0140) FiO2 (%):  [100 %] 100 % (01/03 0140)  Intubated  in ICU, she is on levophed and vasopressin Abd distended. Bowel sounds + Heart RR, rate 105, no MCR NG tube in place, not on suction currently.   Lab Results: Recent Labs    13-Apr-2020 0005 04/13/2020 0006 2020-04-13 0006 2020/04/13 1947 April 13, 2020 2005 13-Apr-2020 2135 04/05/2020 0003  WBC  --  16.5*  --  16.2*  --   --   --   HGB  --  5.2*   < > 9.1* 11.2* 6.5* 5.1*  HCT  --  18.4*   < > 30.1* 33.0* 19.0* 15.0*  PLT 109* 107*  --  146*  --   --   --   MCV  --  117.9*  --  111.1*  --   --   --    < > = values in this interval not displayed.   BMET Recent Labs    13-Apr-2020 1947 Apr 13, 2020 2005 April 13, 2020 2135 03/30/2020 0003  NA 131* 130* 131* 133*  K 4.3 4.1 4.2 4.2  CL 96* 97*  --   --   CO2 9*  --   --   --   GLUCOSE 63* 54*  --   --   BUN 13 16  --   --   CREATININE 1.04* 0.70  --   --   CALCIUM 9.5  --   --   --    LFT Recent Labs    04-13-2020 1947  BILITOT 10.3*  AST 128*  ALT 51*  ALKPHOS 92  PROT 5.9*  ALBUMIN 2.3*   PT/INR Recent Labs    04/13/2020 0005 2020/04/13 1941  LABPROT 49.8* 30.4*  INR 5.7* 3.0*    Imaging/Other Results: DG Chest 2 View  Result Date: April 13, 2020 CLINICAL DATA:  60 year old female with history of shortness of breath. Difficulty breathing. EXAM: CHEST - 2 VIEW COMPARISON:  Chest x-ray 03/09/2020. FINDINGS: Poorly defined opacities and areas of interstitial prominence are noted throughout the mid to lower lungs bilaterally (left greater than right), compatible with multilobar bilateral pneumonia. Elevation of the right hemidiaphragm with small right pleural effusion. No pneumothorax. No evidence of pulmonary edema. Heart size is normal. Upper mediastinal contours are within normal limits. Status post ORIF in the right proximal humerus. Surgical clips project over the expected location of gastroesophageal junction. IMPRESSION: 1. The appearance of the lungs is most compatible with severe multilobar bilateral pneumonia. Small right pleural effusion.  Electronically Signed   By: Trudie Reed M.D.   On: 04-13-20 19:35   DG CHEST PORT 1 VIEW  Result Date: 04/05/2020 CLINICAL DATA:  Intubation EXAM: PORTABLE CHEST 1 VIEW COMPARISON:  April 13, 2020 at 7:12 p.m. FINDINGS: Endotracheal tube tip is just below the level of the clavicular heads. Enteric tube courses below the diaphragm to the stomach. Multifocal airspace opacity, left-greater-than-right. IMPRESSION: Endotracheal tube tip at the level of the clavicular heads. Electronically Signed   By: Deatra Robinson M.D.   On: 04/10/2020 00:02   DG Abd Portable 1V  Result Date: Apr 13, 2020 CLINICAL DATA:  Orogastric tube placement EXAM: PORTABLE ABDOMEN -  1 VIEW COMPARISON:  None. FINDINGS: Tip and side port of the orogastric tube project within the stomach. No dilated bowel visible. IMPRESSION: Orogastric tube tip and side port in the stomach. Electronically Signed   By: Deatra Robinson M.D.   On: 04/12/2020 23:49     Impression/Plan: 60 y.o. female with AIH/Etoh cirrhosis, upper GI bleeding, bilateral pneumonia  Bleeding is most likely from varices, ulcer at band site or the same abnormal mucosa noted a week ago.  She is critically ill, her prognosis is very guarded. Her admitting MELD-Na was 30. She is intubated in the ICU on 2 pressors and her INR is increasing (currently 4.5).  EGD in the current setting would be futile. She needs continued supportive care, blood product transfusion. Hopefully she will stabilize in the next several hours. Should continue IV PPI drip, IV octreotide drip, IV abx.  We will follow along.  I discussed this with CCM and her cousin (caregiver)  Rachael Fee, MD  Mar 21, 2020, 1:43 AM [facility] Pager [phone]

## 2020-04-16 DEATH — deceased

## 2022-03-31 IMAGING — US US ABDOMEN LIMITED
1 series · 14 of 25 positions shown · non-contrast
Comparison: Right upper quadrant ultrasound dated 03/01/2017.

CLINICAL DATA: 59-year-old female with cirrhosis.

EXAM:
ULTRASOUND ABDOMEN LIMITED RIGHT UPPER QUADRANT

[Series 1: us abdomen limited · 14 of 70 slices shown]
[im 1/70]
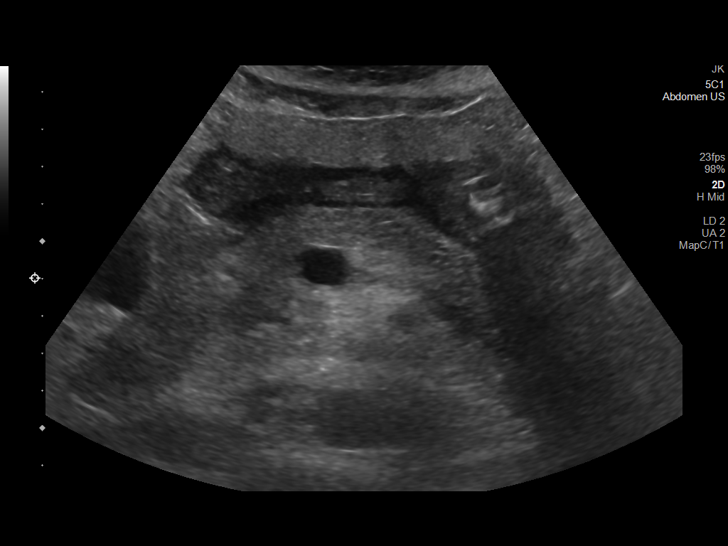
[im 6/70]
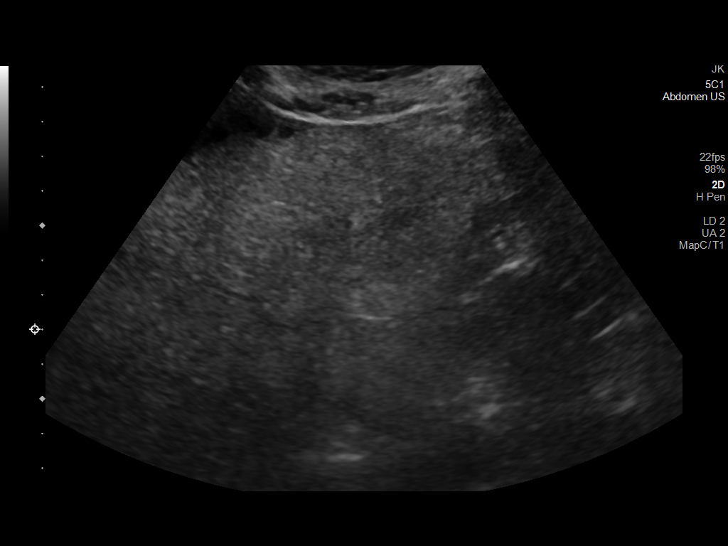
[im 12/70]
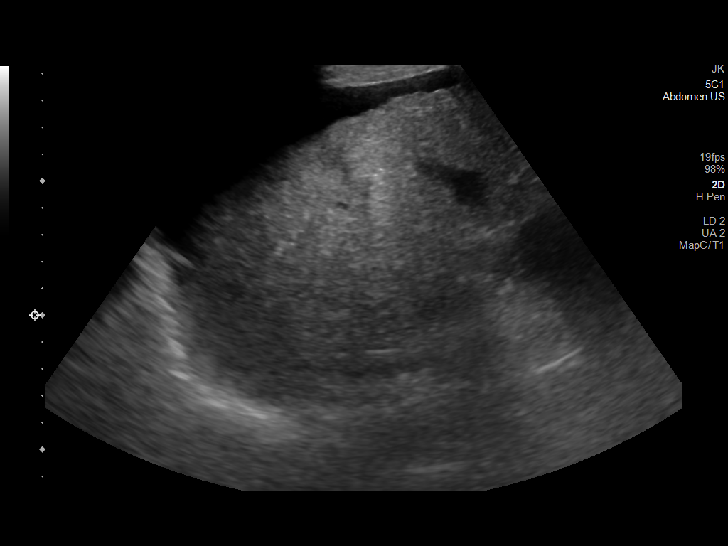
[im 18/70]
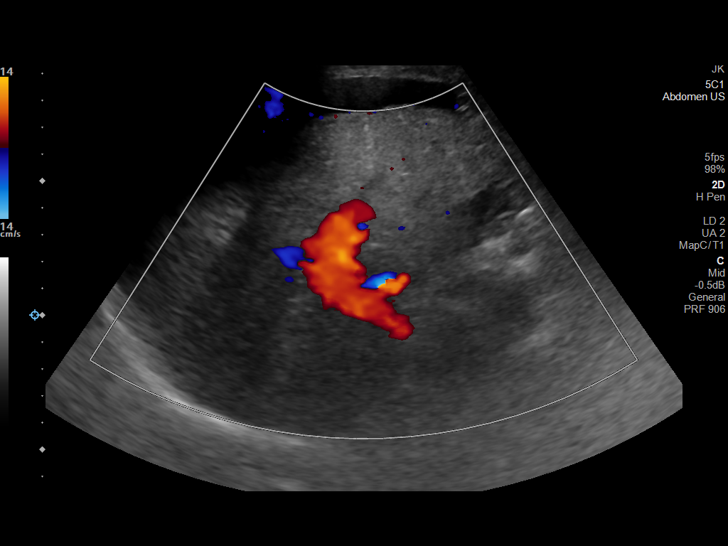
[im 24/70]
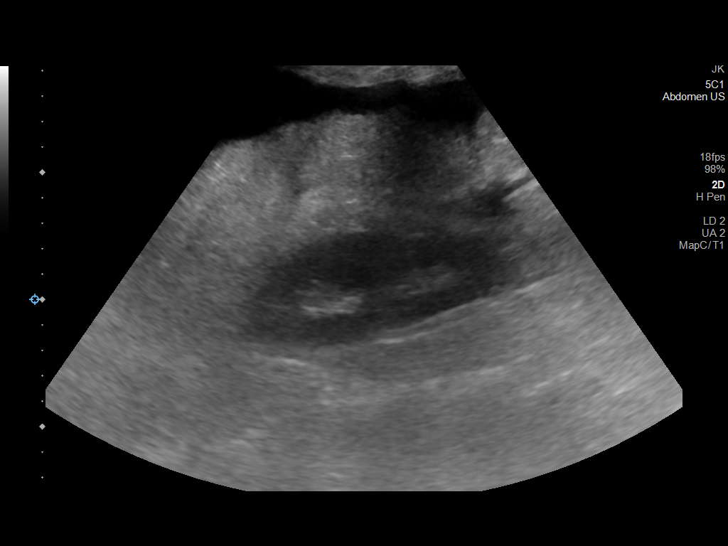
[im 26/70]
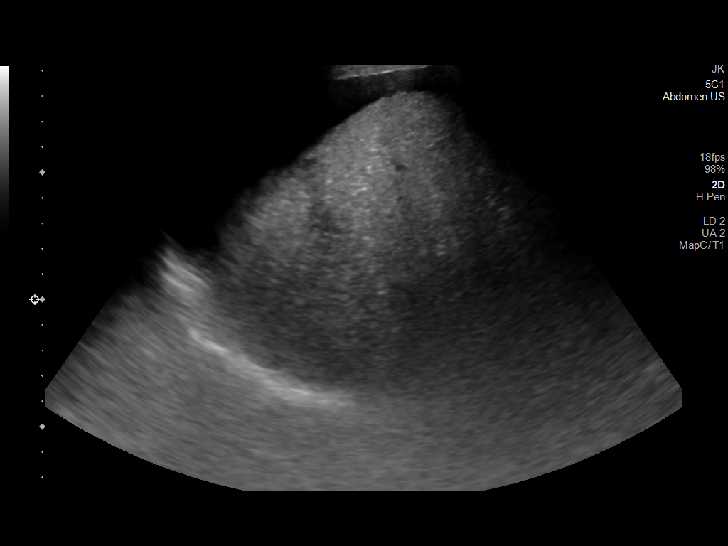
[im 32/70]
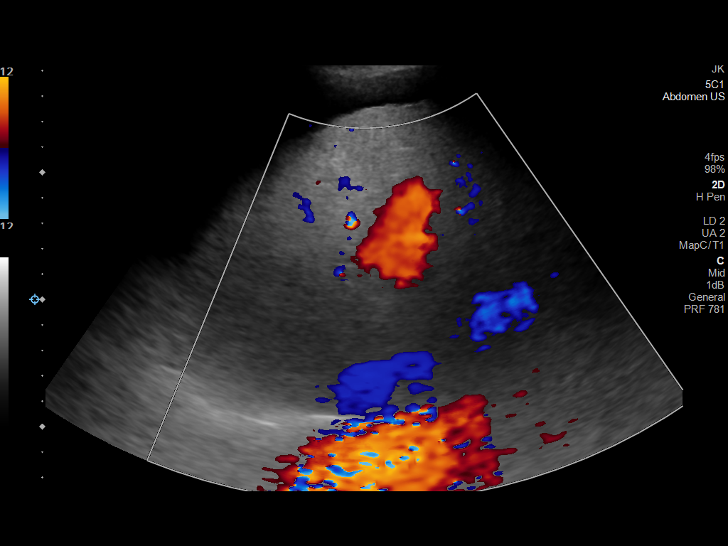
[im 38/70]
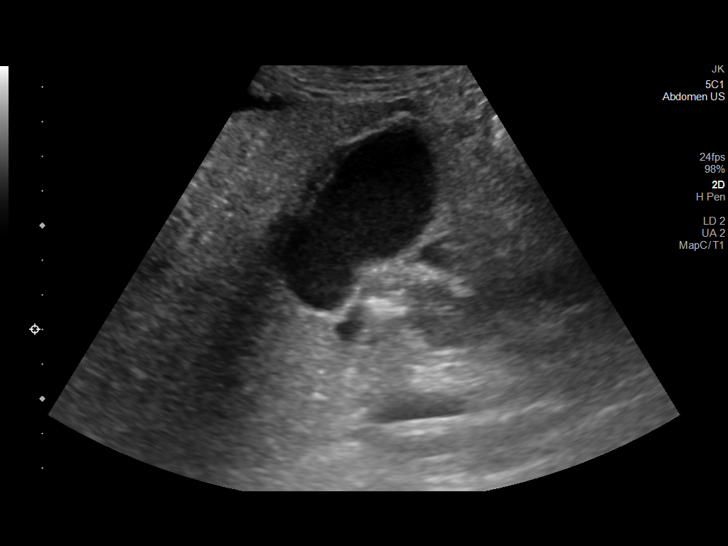
[im 44/70]
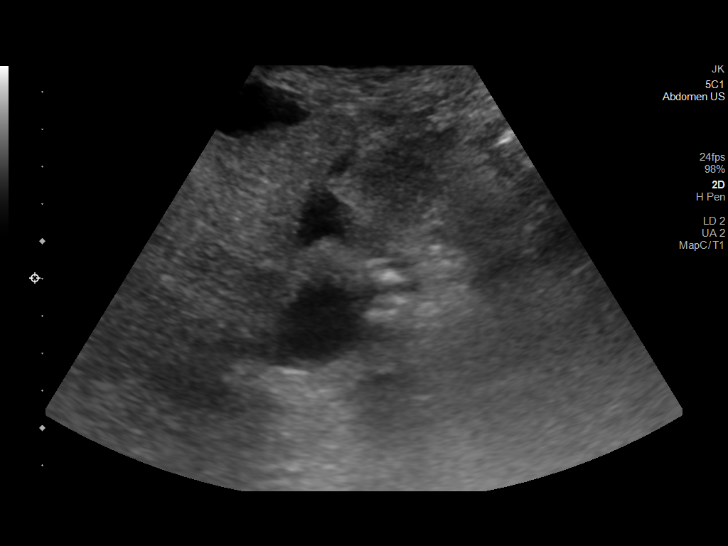
[im 47/70]
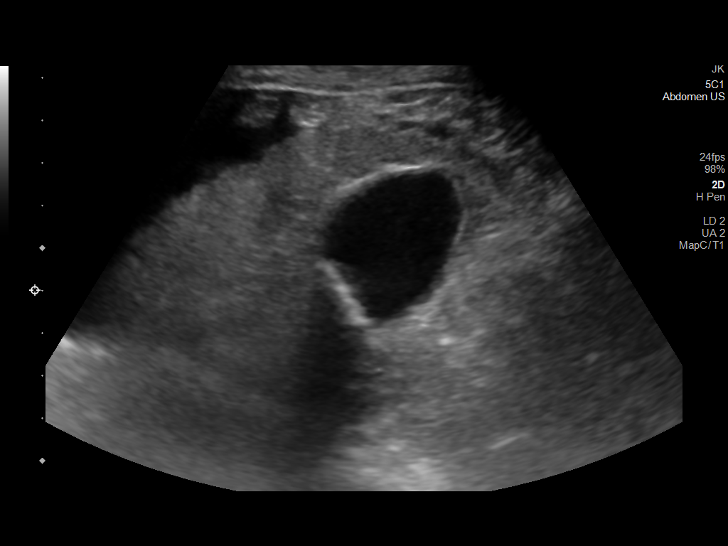
[im 52/70]
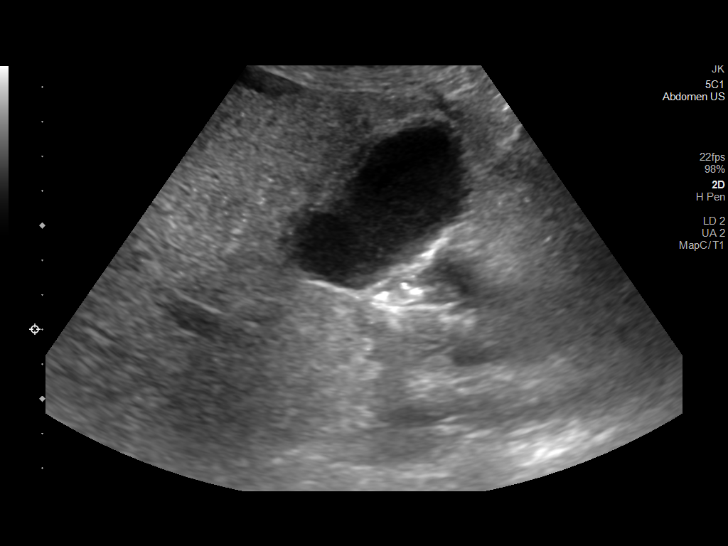
[im 58/70]
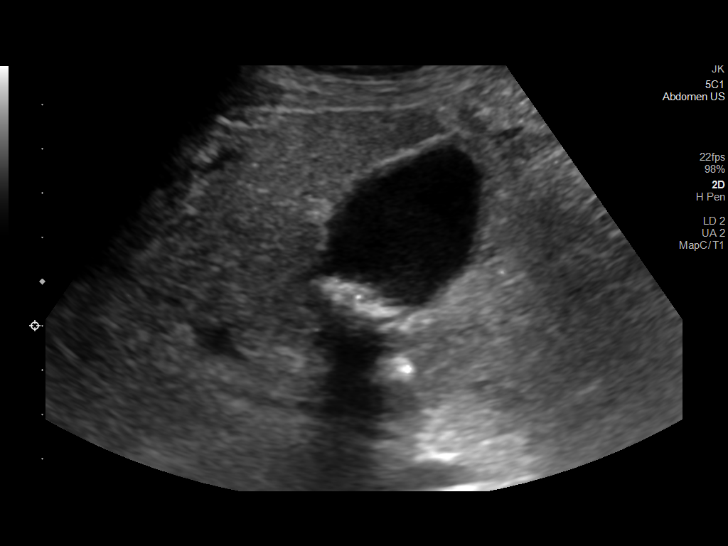
[im 64/70]
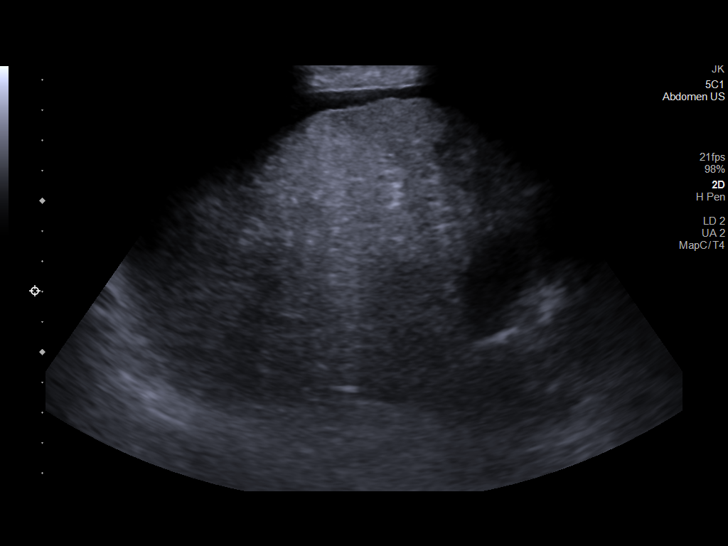
[im 70/70]
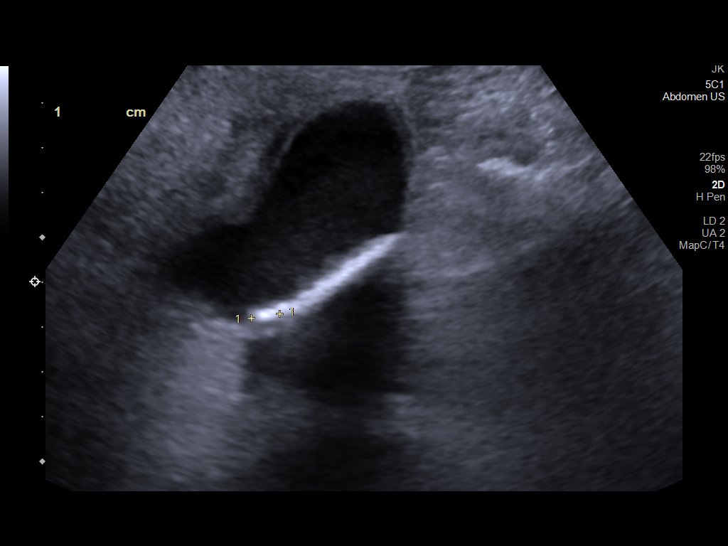

[14 of 25 positions shown; findings below may reference images not displayed]

FINDINGS: Gallbladder:

There are multiple stones within the gallbladder. The gallbladder
wall is thickened and edematous measuring approximately 4 mm. Small
pericholecystic fluid noted. These findings may be related to
underlying liver disease. Clinical correlation is recommended.
Negative sonographic Murphy's sign.

Common bile duct:

Diameter: 5 mm

Liver:

Morphologic changes of cirrhosis. No discrete mass identified.
Portal vein is patent on color Doppler imaging with normal direction
of blood flow towards the liver.

Other: Small ascites.
IMPRESSION: 1. Cirrhosis.
2. Cholelithiasis with equivocal sonographic findings for acute
cholecystitis. A hepatobiliary scintigraphy may provide better
evaluation of the gallbladder if there is a high clinical concern
for acute cholecystitis .
3. Patent main portal vein with hepatopetal flow.
4. Small ascites.
